# Patient Record
Sex: Female | Born: 1945 | Race: White | Hispanic: No | Marital: Married | Smoking: Never smoker
Health system: Southern US, Community
[De-identification: ages and names within clinical notes are randomized; demographics above are authoritative.]

## PROBLEM LIST (undated history)

## (undated) DIAGNOSIS — Z95810 Presence of automatic (implantable) cardiac defibrillator: Secondary | ICD-10-CM

## (undated) DIAGNOSIS — I429 Cardiomyopathy, unspecified: Secondary | ICD-10-CM

## (undated) DIAGNOSIS — I6529 Occlusion and stenosis of unspecified carotid artery: Secondary | ICD-10-CM

## (undated) DIAGNOSIS — E1122 Type 2 diabetes mellitus with diabetic chronic kidney disease: Secondary | ICD-10-CM

## (undated) DIAGNOSIS — N183 Chronic kidney disease, stage 3 (moderate): Secondary | ICD-10-CM

## (undated) DIAGNOSIS — E039 Hypothyroidism, unspecified: Secondary | ICD-10-CM

## (undated) DIAGNOSIS — Z79899 Other long term (current) drug therapy: Secondary | ICD-10-CM

## (undated) DIAGNOSIS — I472 Ventricular tachycardia: Secondary | ICD-10-CM

## (undated) DIAGNOSIS — I501 Left ventricular failure: Secondary | ICD-10-CM

## (undated) DIAGNOSIS — I2510 Atherosclerotic heart disease of native coronary artery without angina pectoris: Secondary | ICD-10-CM

## (undated) DIAGNOSIS — Z7982 Long term (current) use of aspirin: Secondary | ICD-10-CM

## (undated) DIAGNOSIS — I10 Essential (primary) hypertension: Secondary | ICD-10-CM

## (undated) DIAGNOSIS — E7800 Pure hypercholesterolemia, unspecified: Secondary | ICD-10-CM

## (undated) DIAGNOSIS — I25118 Atherosclerotic heart disease of native coronary artery with other forms of angina pectoris: Secondary | ICD-10-CM

## (undated) DIAGNOSIS — I209 Angina pectoris, unspecified: Secondary | ICD-10-CM

## (undated) DIAGNOSIS — R55 Syncope and collapse: Secondary | ICD-10-CM

## (undated) DIAGNOSIS — Z794 Long term (current) use of insulin: Secondary | ICD-10-CM

## (undated) DIAGNOSIS — I219 Acute myocardial infarction, unspecified: Secondary | ICD-10-CM

## (undated) DIAGNOSIS — I6523 Occlusion and stenosis of bilateral carotid arteries: Secondary | ICD-10-CM

## (undated) HISTORY — DX: Acute myocardial infarction, unspecified: I21.9

## (undated) HISTORY — PX: CORONARY STENT PLACEMENT: SHX1402

## (undated) HISTORY — DX: Presence of automatic (implantable) cardiac defibrillator: Z95.810

## (undated) HISTORY — PX: CARDIAC CATHETERIZATION: SHX172

## (undated) HISTORY — DX: Ventricular tachycardia: I47.2

## (undated) HISTORY — DX: Cardiomyopathy, unspecified: I42.9

## (undated) HISTORY — DX: Left ventricular failure, unspecified: I50.1

## (undated) HISTORY — DX: Occlusion and stenosis of unspecified carotid artery: I65.29

## (undated) HISTORY — DX: Long term (current) use of insulin: Z79.4

## (undated) HISTORY — DX: Long term (current) use of aspirin: Z79.82

## (undated) HISTORY — PX: ANKLE FRACTURE SURGERY: SHX122

## (undated) HISTORY — DX: Other long term (current) drug therapy: Z79.899

## (undated) HISTORY — PX: PACEMAKER PLACEMENT: SHX43

## (undated) HISTORY — DX: Essential (primary) hypertension: I10

## (undated) HISTORY — DX: Atherosclerotic heart disease of native coronary artery without angina pectoris: I25.10

## (undated) HISTORY — PX: ABDOMINAL HYSTERECTOMY: SHX81

## (undated) HISTORY — DX: Angina pectoris, unspecified: I20.9

## (undated) HISTORY — DX: Type 2 diabetes mellitus with diabetic chronic kidney disease: E11.22

## (undated) HISTORY — DX: Pure hypercholesterolemia, unspecified: E78.00

## (undated) HISTORY — DX: Hypothyroidism, unspecified: E03.9

## (undated) HISTORY — DX: Syncope and collapse: R55

## (undated) HISTORY — PX: CATARACT EXTRACTION: SUR2

## (undated) HISTORY — DX: Atherosclerotic heart disease of native coronary artery with other forms of angina pectoris: I25.118

## (undated) HISTORY — DX: Occlusion and stenosis of bilateral carotid arteries: I65.23

## (undated) HISTORY — DX: Chronic kidney disease, stage 3 (moderate): N18.3

---

## 2011-09-17 HISTORY — PX: COLONOSCOPY: SHX174

## 2014-10-11 DIAGNOSIS — I498 Other specified cardiac arrhythmias: Secondary | ICD-10-CM | POA: Diagnosis not present

## 2014-11-30 DIAGNOSIS — Z23 Encounter for immunization: Secondary | ICD-10-CM | POA: Diagnosis not present

## 2014-11-30 DIAGNOSIS — E119 Type 2 diabetes mellitus without complications: Secondary | ICD-10-CM | POA: Diagnosis not present

## 2015-01-12 DIAGNOSIS — I498 Other specified cardiac arrhythmias: Secondary | ICD-10-CM | POA: Diagnosis not present

## 2015-01-12 DIAGNOSIS — Z4502 Encounter for adjustment and management of automatic implantable cardiac defibrillator: Secondary | ICD-10-CM | POA: Diagnosis not present

## 2015-02-09 DIAGNOSIS — L820 Inflamed seborrheic keratosis: Secondary | ICD-10-CM | POA: Diagnosis not present

## 2015-03-06 DIAGNOSIS — I429 Cardiomyopathy, unspecified: Secondary | ICD-10-CM | POA: Diagnosis not present

## 2015-03-06 DIAGNOSIS — Z95810 Presence of automatic (implantable) cardiac defibrillator: Secondary | ICD-10-CM | POA: Diagnosis not present

## 2015-03-06 DIAGNOSIS — R198 Other specified symptoms and signs involving the digestive system and abdomen: Secondary | ICD-10-CM | POA: Diagnosis not present

## 2015-03-09 DIAGNOSIS — E1165 Type 2 diabetes mellitus with hyperglycemia: Secondary | ICD-10-CM | POA: Diagnosis not present

## 2015-03-09 DIAGNOSIS — N183 Chronic kidney disease, stage 3 (moderate): Secondary | ICD-10-CM | POA: Diagnosis not present

## 2015-03-09 DIAGNOSIS — R109 Unspecified abdominal pain: Secondary | ICD-10-CM | POA: Diagnosis not present

## 2015-04-18 DIAGNOSIS — Z4502 Encounter for adjustment and management of automatic implantable cardiac defibrillator: Secondary | ICD-10-CM | POA: Diagnosis not present

## 2015-04-18 DIAGNOSIS — I498 Other specified cardiac arrhythmias: Secondary | ICD-10-CM | POA: Diagnosis not present

## 2015-05-01 DIAGNOSIS — I501 Left ventricular failure: Secondary | ICD-10-CM

## 2015-05-01 DIAGNOSIS — E7800 Pure hypercholesterolemia, unspecified: Secondary | ICD-10-CM

## 2015-05-01 DIAGNOSIS — N184 Chronic kidney disease, stage 4 (severe): Secondary | ICD-10-CM

## 2015-05-01 DIAGNOSIS — I472 Ventricular tachycardia, unspecified: Secondary | ICD-10-CM | POA: Insufficient documentation

## 2015-05-01 DIAGNOSIS — I2510 Atherosclerotic heart disease of native coronary artery without angina pectoris: Secondary | ICD-10-CM

## 2015-05-01 DIAGNOSIS — I25118 Atherosclerotic heart disease of native coronary artery with other forms of angina pectoris: Secondary | ICD-10-CM

## 2015-05-01 DIAGNOSIS — Z95810 Presence of automatic (implantable) cardiac defibrillator: Secondary | ICD-10-CM

## 2015-05-01 DIAGNOSIS — I25119 Atherosclerotic heart disease of native coronary artery with unspecified angina pectoris: Secondary | ICD-10-CM

## 2015-05-01 DIAGNOSIS — I5022 Chronic systolic (congestive) heart failure: Secondary | ICD-10-CM

## 2015-05-01 DIAGNOSIS — I130 Hypertensive heart and chronic kidney disease with heart failure and stage 1 through stage 4 chronic kidney disease, or unspecified chronic kidney disease: Secondary | ICD-10-CM

## 2015-05-01 DIAGNOSIS — E039 Hypothyroidism, unspecified: Secondary | ICD-10-CM

## 2015-05-01 DIAGNOSIS — I219 Acute myocardial infarction, unspecified: Secondary | ICD-10-CM

## 2015-05-01 DIAGNOSIS — R55 Syncope and collapse: Secondary | ICD-10-CM

## 2015-05-01 DIAGNOSIS — I10 Essential (primary) hypertension: Secondary | ICD-10-CM | POA: Insufficient documentation

## 2015-05-01 DIAGNOSIS — I429 Cardiomyopathy, unspecified: Secondary | ICD-10-CM

## 2015-05-01 HISTORY — DX: Hypothyroidism, unspecified: E03.9

## 2015-05-01 HISTORY — DX: Presence of automatic (implantable) cardiac defibrillator: Z95.810

## 2015-05-01 HISTORY — DX: Hypertensive heart and chronic kidney disease with heart failure and stage 1 through stage 4 chronic kidney disease, or unspecified chronic kidney disease: I13.0

## 2015-05-01 HISTORY — DX: Acute myocardial infarction, unspecified: I21.9

## 2015-05-01 HISTORY — DX: Ventricular tachycardia, unspecified: I47.20

## 2015-05-01 HISTORY — DX: Atherosclerotic heart disease of native coronary artery without angina pectoris: I25.10

## 2015-05-01 HISTORY — DX: Essential (primary) hypertension: I10

## 2015-05-01 HISTORY — DX: Atherosclerotic heart disease of native coronary artery with other forms of angina pectoris: I25.118

## 2015-05-01 HISTORY — DX: Pure hypercholesterolemia, unspecified: E78.00

## 2015-05-01 HISTORY — DX: Left ventricular failure, unspecified: I50.1

## 2015-05-01 HISTORY — DX: Atherosclerotic heart disease of native coronary artery with unspecified angina pectoris: I25.119

## 2015-05-01 HISTORY — DX: Cardiomyopathy, unspecified: I42.9

## 2015-05-01 HISTORY — DX: Ventricular tachycardia: I47.2

## 2015-05-01 HISTORY — DX: Syncope and collapse: R55

## 2015-05-03 DIAGNOSIS — S0990XA Unspecified injury of head, initial encounter: Secondary | ICD-10-CM | POA: Diagnosis not present

## 2015-05-03 DIAGNOSIS — E1165 Type 2 diabetes mellitus with hyperglycemia: Secondary | ICD-10-CM | POA: Diagnosis not present

## 2015-05-03 DIAGNOSIS — R51 Headache: Secondary | ICD-10-CM | POA: Diagnosis not present

## 2015-05-03 DIAGNOSIS — G4700 Insomnia, unspecified: Secondary | ICD-10-CM | POA: Diagnosis not present

## 2015-05-03 DIAGNOSIS — R55 Syncope and collapse: Secondary | ICD-10-CM | POA: Diagnosis not present

## 2015-05-09 DIAGNOSIS — I6523 Occlusion and stenosis of bilateral carotid arteries: Secondary | ICD-10-CM | POA: Diagnosis not present

## 2015-05-09 DIAGNOSIS — R55 Syncope and collapse: Secondary | ICD-10-CM | POA: Diagnosis not present

## 2015-05-12 DIAGNOSIS — Z95810 Presence of automatic (implantable) cardiac defibrillator: Secondary | ICD-10-CM | POA: Diagnosis not present

## 2015-05-12 DIAGNOSIS — I429 Cardiomyopathy, unspecified: Secondary | ICD-10-CM | POA: Diagnosis not present

## 2015-05-12 DIAGNOSIS — I779 Disorder of arteries and arterioles, unspecified: Secondary | ICD-10-CM | POA: Diagnosis not present

## 2015-05-18 DIAGNOSIS — Z95810 Presence of automatic (implantable) cardiac defibrillator: Secondary | ICD-10-CM | POA: Diagnosis not present

## 2015-05-18 DIAGNOSIS — I6523 Occlusion and stenosis of bilateral carotid arteries: Secondary | ICD-10-CM | POA: Diagnosis not present

## 2015-05-18 DIAGNOSIS — I779 Disorder of arteries and arterioles, unspecified: Secondary | ICD-10-CM | POA: Diagnosis not present

## 2015-06-15 DIAGNOSIS — I6523 Occlusion and stenosis of bilateral carotid arteries: Secondary | ICD-10-CM | POA: Diagnosis not present

## 2015-06-15 DIAGNOSIS — Z95810 Presence of automatic (implantable) cardiac defibrillator: Secondary | ICD-10-CM | POA: Diagnosis not present

## 2015-06-15 DIAGNOSIS — I10 Essential (primary) hypertension: Secondary | ICD-10-CM | POA: Diagnosis not present

## 2015-06-15 DIAGNOSIS — E119 Type 2 diabetes mellitus without complications: Secondary | ICD-10-CM | POA: Diagnosis not present

## 2015-06-15 DIAGNOSIS — E785 Hyperlipidemia, unspecified: Secondary | ICD-10-CM | POA: Diagnosis not present

## 2015-06-21 DIAGNOSIS — I6521 Occlusion and stenosis of right carotid artery: Secondary | ICD-10-CM | POA: Diagnosis not present

## 2015-06-21 DIAGNOSIS — I6529 Occlusion and stenosis of unspecified carotid artery: Secondary | ICD-10-CM | POA: Insufficient documentation

## 2015-06-21 DIAGNOSIS — E119 Type 2 diabetes mellitus without complications: Secondary | ICD-10-CM | POA: Diagnosis not present

## 2015-06-21 DIAGNOSIS — I10 Essential (primary) hypertension: Secondary | ICD-10-CM | POA: Diagnosis not present

## 2015-06-21 DIAGNOSIS — I252 Old myocardial infarction: Secondary | ICD-10-CM | POA: Diagnosis not present

## 2015-06-21 DIAGNOSIS — I509 Heart failure, unspecified: Secondary | ICD-10-CM | POA: Diagnosis not present

## 2015-06-21 DIAGNOSIS — Z794 Long term (current) use of insulin: Secondary | ICD-10-CM | POA: Diagnosis not present

## 2015-06-21 HISTORY — PX: CAROTID STENT: SHX1301

## 2015-06-21 HISTORY — DX: Occlusion and stenosis of unspecified carotid artery: I65.29

## 2015-07-04 DIAGNOSIS — Z23 Encounter for immunization: Secondary | ICD-10-CM | POA: Diagnosis not present

## 2015-07-07 DIAGNOSIS — E039 Hypothyroidism, unspecified: Secondary | ICD-10-CM | POA: Diagnosis not present

## 2015-07-07 DIAGNOSIS — N179 Acute kidney failure, unspecified: Secondary | ICD-10-CM | POA: Diagnosis not present

## 2015-07-07 DIAGNOSIS — I25110 Atherosclerotic heart disease of native coronary artery with unstable angina pectoris: Secondary | ICD-10-CM | POA: Diagnosis not present

## 2015-07-07 DIAGNOSIS — I5042 Chronic combined systolic (congestive) and diastolic (congestive) heart failure: Secondary | ICD-10-CM | POA: Diagnosis not present

## 2015-07-07 DIAGNOSIS — Z79899 Other long term (current) drug therapy: Secondary | ICD-10-CM | POA: Diagnosis not present

## 2015-07-07 DIAGNOSIS — I252 Old myocardial infarction: Secondary | ICD-10-CM | POA: Diagnosis not present

## 2015-07-07 DIAGNOSIS — F329 Major depressive disorder, single episode, unspecified: Secondary | ICD-10-CM | POA: Diagnosis not present

## 2015-07-07 DIAGNOSIS — I509 Heart failure, unspecified: Secondary | ICD-10-CM | POA: Diagnosis not present

## 2015-07-07 DIAGNOSIS — I10 Essential (primary) hypertension: Secondary | ICD-10-CM | POA: Diagnosis not present

## 2015-07-07 DIAGNOSIS — E785 Hyperlipidemia, unspecified: Secondary | ICD-10-CM | POA: Diagnosis not present

## 2015-07-07 DIAGNOSIS — I209 Angina pectoris, unspecified: Secondary | ICD-10-CM | POA: Diagnosis not present

## 2015-07-07 DIAGNOSIS — I249 Acute ischemic heart disease, unspecified: Secondary | ICD-10-CM | POA: Diagnosis not present

## 2015-07-07 DIAGNOSIS — E1165 Type 2 diabetes mellitus with hyperglycemia: Secondary | ICD-10-CM | POA: Diagnosis not present

## 2015-07-07 DIAGNOSIS — R079 Chest pain, unspecified: Secondary | ICD-10-CM | POA: Diagnosis not present

## 2015-07-07 DIAGNOSIS — Z882 Allergy status to sulfonamides status: Secondary | ICD-10-CM | POA: Diagnosis not present

## 2015-07-07 DIAGNOSIS — Z794 Long term (current) use of insulin: Secondary | ICD-10-CM | POA: Diagnosis not present

## 2015-07-07 DIAGNOSIS — Z95810 Presence of automatic (implantable) cardiac defibrillator: Secondary | ICD-10-CM | POA: Diagnosis not present

## 2015-07-07 DIAGNOSIS — M1990 Unspecified osteoarthritis, unspecified site: Secondary | ICD-10-CM | POA: Diagnosis not present

## 2015-07-07 DIAGNOSIS — I200 Unstable angina: Secondary | ICD-10-CM | POA: Diagnosis not present

## 2015-07-07 DIAGNOSIS — Z9861 Coronary angioplasty status: Secondary | ICD-10-CM | POA: Diagnosis not present

## 2015-07-07 DIAGNOSIS — R0600 Dyspnea, unspecified: Secondary | ICD-10-CM | POA: Diagnosis not present

## 2015-07-07 DIAGNOSIS — R0602 Shortness of breath: Secondary | ICD-10-CM | POA: Diagnosis not present

## 2015-07-07 DIAGNOSIS — I255 Ischemic cardiomyopathy: Secondary | ICD-10-CM | POA: Diagnosis not present

## 2015-07-07 DIAGNOSIS — I6521 Occlusion and stenosis of right carotid artery: Secondary | ICD-10-CM | POA: Diagnosis not present

## 2015-07-09 DIAGNOSIS — Z95810 Presence of automatic (implantable) cardiac defibrillator: Secondary | ICD-10-CM | POA: Diagnosis not present

## 2015-07-09 DIAGNOSIS — I501 Left ventricular failure: Secondary | ICD-10-CM | POA: Diagnosis not present

## 2015-07-09 DIAGNOSIS — I2510 Atherosclerotic heart disease of native coronary artery without angina pectoris: Secondary | ICD-10-CM | POA: Diagnosis not present

## 2015-07-09 DIAGNOSIS — I25118 Atherosclerotic heart disease of native coronary artery with other forms of angina pectoris: Secondary | ICD-10-CM | POA: Diagnosis not present

## 2015-07-09 DIAGNOSIS — I255 Ischemic cardiomyopathy: Secondary | ICD-10-CM | POA: Diagnosis not present

## 2015-07-09 DIAGNOSIS — N179 Acute kidney failure, unspecified: Secondary | ICD-10-CM | POA: Diagnosis not present

## 2015-07-09 DIAGNOSIS — R0602 Shortness of breath: Secondary | ICD-10-CM | POA: Diagnosis not present

## 2015-07-09 DIAGNOSIS — I10 Essential (primary) hypertension: Secondary | ICD-10-CM | POA: Diagnosis not present

## 2015-07-09 DIAGNOSIS — I498 Other specified cardiac arrhythmias: Secondary | ICD-10-CM | POA: Diagnosis not present

## 2015-07-09 DIAGNOSIS — I2109 ST elevation (STEMI) myocardial infarction involving other coronary artery of anterior wall: Secondary | ICD-10-CM | POA: Diagnosis not present

## 2015-07-09 DIAGNOSIS — Z955 Presence of coronary angioplasty implant and graft: Secondary | ICD-10-CM | POA: Diagnosis not present

## 2015-07-09 DIAGNOSIS — Z7982 Long term (current) use of aspirin: Secondary | ICD-10-CM | POA: Diagnosis not present

## 2015-07-09 DIAGNOSIS — I252 Old myocardial infarction: Secondary | ICD-10-CM | POA: Diagnosis not present

## 2015-07-09 DIAGNOSIS — I739 Peripheral vascular disease, unspecified: Secondary | ICD-10-CM | POA: Diagnosis not present

## 2015-07-09 DIAGNOSIS — E1122 Type 2 diabetes mellitus with diabetic chronic kidney disease: Secondary | ICD-10-CM | POA: Diagnosis not present

## 2015-07-09 DIAGNOSIS — R079 Chest pain, unspecified: Secondary | ICD-10-CM | POA: Diagnosis not present

## 2015-07-09 DIAGNOSIS — E785 Hyperlipidemia, unspecified: Secondary | ICD-10-CM | POA: Diagnosis not present

## 2015-07-09 DIAGNOSIS — I509 Heart failure, unspecified: Secondary | ICD-10-CM | POA: Diagnosis not present

## 2015-07-09 DIAGNOSIS — I200 Unstable angina: Secondary | ICD-10-CM | POA: Diagnosis not present

## 2015-07-09 DIAGNOSIS — E1165 Type 2 diabetes mellitus with hyperglycemia: Secondary | ICD-10-CM | POA: Diagnosis not present

## 2015-07-09 DIAGNOSIS — N189 Chronic kidney disease, unspecified: Secondary | ICD-10-CM | POA: Diagnosis not present

## 2015-07-09 DIAGNOSIS — I249 Acute ischemic heart disease, unspecified: Secondary | ICD-10-CM | POA: Diagnosis not present

## 2015-07-09 DIAGNOSIS — I5042 Chronic combined systolic (congestive) and diastolic (congestive) heart failure: Secondary | ICD-10-CM | POA: Diagnosis not present

## 2015-07-09 DIAGNOSIS — Z4502 Encounter for adjustment and management of automatic implantable cardiac defibrillator: Secondary | ICD-10-CM | POA: Diagnosis not present

## 2015-07-09 DIAGNOSIS — I130 Hypertensive heart and chronic kidney disease with heart failure and stage 1 through stage 4 chronic kidney disease, or unspecified chronic kidney disease: Secondary | ICD-10-CM | POA: Diagnosis not present

## 2015-07-09 DIAGNOSIS — I2582 Chronic total occlusion of coronary artery: Secondary | ICD-10-CM | POA: Diagnosis not present

## 2015-07-13 DIAGNOSIS — I209 Angina pectoris, unspecified: Secondary | ICD-10-CM

## 2015-07-13 DIAGNOSIS — I498 Other specified cardiac arrhythmias: Secondary | ICD-10-CM | POA: Diagnosis not present

## 2015-07-13 DIAGNOSIS — Z4502 Encounter for adjustment and management of automatic implantable cardiac defibrillator: Secondary | ICD-10-CM | POA: Diagnosis not present

## 2015-07-13 HISTORY — DX: Angina pectoris, unspecified: I20.9

## 2015-07-27 DIAGNOSIS — Z95820 Peripheral vascular angioplasty status with implants and grafts: Secondary | ICD-10-CM | POA: Diagnosis not present

## 2015-07-27 DIAGNOSIS — I6523 Occlusion and stenosis of bilateral carotid arteries: Secondary | ICD-10-CM | POA: Diagnosis not present

## 2015-08-09 DIAGNOSIS — H9202 Otalgia, left ear: Secondary | ICD-10-CM | POA: Diagnosis not present

## 2015-08-22 DIAGNOSIS — I10 Essential (primary) hypertension: Secondary | ICD-10-CM | POA: Diagnosis not present

## 2015-08-22 DIAGNOSIS — I5022 Chronic systolic (congestive) heart failure: Secondary | ICD-10-CM | POA: Diagnosis not present

## 2015-08-22 DIAGNOSIS — I6521 Occlusion and stenosis of right carotid artery: Secondary | ICD-10-CM | POA: Diagnosis not present

## 2015-08-22 DIAGNOSIS — I429 Cardiomyopathy, unspecified: Secondary | ICD-10-CM | POA: Diagnosis not present

## 2015-08-22 DIAGNOSIS — I25118 Atherosclerotic heart disease of native coronary artery with other forms of angina pectoris: Secondary | ICD-10-CM | POA: Diagnosis not present

## 2015-10-13 DIAGNOSIS — I2510 Atherosclerotic heart disease of native coronary artery without angina pectoris: Secondary | ICD-10-CM | POA: Diagnosis not present

## 2015-10-13 DIAGNOSIS — I498 Other specified cardiac arrhythmias: Secondary | ICD-10-CM | POA: Diagnosis not present

## 2015-10-13 DIAGNOSIS — Z95810 Presence of automatic (implantable) cardiac defibrillator: Secondary | ICD-10-CM | POA: Diagnosis not present

## 2015-10-13 DIAGNOSIS — Z4502 Encounter for adjustment and management of automatic implantable cardiac defibrillator: Secondary | ICD-10-CM | POA: Diagnosis not present

## 2015-10-13 DIAGNOSIS — I429 Cardiomyopathy, unspecified: Secondary | ICD-10-CM | POA: Diagnosis not present

## 2015-10-13 DIAGNOSIS — I25118 Atherosclerotic heart disease of native coronary artery with other forms of angina pectoris: Secondary | ICD-10-CM | POA: Diagnosis not present

## 2015-10-13 DIAGNOSIS — I501 Left ventricular failure: Secondary | ICD-10-CM | POA: Diagnosis not present

## 2015-10-24 DIAGNOSIS — Z1389 Encounter for screening for other disorder: Secondary | ICD-10-CM | POA: Diagnosis not present

## 2015-10-24 DIAGNOSIS — E039 Hypothyroidism, unspecified: Secondary | ICD-10-CM | POA: Diagnosis not present

## 2015-10-24 DIAGNOSIS — D510 Vitamin B12 deficiency anemia due to intrinsic factor deficiency: Secondary | ICD-10-CM | POA: Diagnosis not present

## 2015-10-24 DIAGNOSIS — E559 Vitamin D deficiency, unspecified: Secondary | ICD-10-CM | POA: Diagnosis not present

## 2015-10-24 DIAGNOSIS — Z0000 Encounter for general adult medical examination without abnormal findings: Secondary | ICD-10-CM | POA: Diagnosis not present

## 2015-10-24 DIAGNOSIS — E1165 Type 2 diabetes mellitus with hyperglycemia: Secondary | ICD-10-CM | POA: Diagnosis not present

## 2015-10-24 DIAGNOSIS — Z79899 Other long term (current) drug therapy: Secondary | ICD-10-CM | POA: Diagnosis not present

## 2015-10-25 DIAGNOSIS — R5383 Other fatigue: Secondary | ICD-10-CM | POA: Diagnosis not present

## 2015-11-01 DIAGNOSIS — I509 Heart failure, unspecified: Secondary | ICD-10-CM | POA: Diagnosis not present

## 2015-11-02 DIAGNOSIS — I25118 Atherosclerotic heart disease of native coronary artery with other forms of angina pectoris: Secondary | ICD-10-CM | POA: Diagnosis not present

## 2015-11-02 DIAGNOSIS — Z95810 Presence of automatic (implantable) cardiac defibrillator: Secondary | ICD-10-CM | POA: Diagnosis not present

## 2015-11-02 DIAGNOSIS — I501 Left ventricular failure: Secondary | ICD-10-CM | POA: Diagnosis not present

## 2015-11-02 DIAGNOSIS — I2582 Chronic total occlusion of coronary artery: Secondary | ICD-10-CM | POA: Diagnosis not present

## 2015-11-02 DIAGNOSIS — I429 Cardiomyopathy, unspecified: Secondary | ICD-10-CM | POA: Diagnosis not present

## 2015-11-07 DIAGNOSIS — I209 Angina pectoris, unspecified: Secondary | ICD-10-CM | POA: Diagnosis not present

## 2015-11-07 DIAGNOSIS — I501 Left ventricular failure: Secondary | ICD-10-CM | POA: Diagnosis not present

## 2015-11-07 DIAGNOSIS — R55 Syncope and collapse: Secondary | ICD-10-CM | POA: Diagnosis not present

## 2015-11-07 DIAGNOSIS — I5022 Chronic systolic (congestive) heart failure: Secondary | ICD-10-CM | POA: Diagnosis not present

## 2015-11-07 DIAGNOSIS — I472 Ventricular tachycardia: Secondary | ICD-10-CM | POA: Diagnosis not present

## 2015-12-06 DIAGNOSIS — I10 Essential (primary) hypertension: Secondary | ICD-10-CM | POA: Diagnosis not present

## 2015-12-06 DIAGNOSIS — I472 Ventricular tachycardia: Secondary | ICD-10-CM | POA: Diagnosis not present

## 2015-12-06 DIAGNOSIS — I501 Left ventricular failure: Secondary | ICD-10-CM | POA: Diagnosis not present

## 2015-12-06 DIAGNOSIS — I25118 Atherosclerotic heart disease of native coronary artery with other forms of angina pectoris: Secondary | ICD-10-CM | POA: Diagnosis not present

## 2015-12-06 DIAGNOSIS — I429 Cardiomyopathy, unspecified: Secondary | ICD-10-CM | POA: Diagnosis not present

## 2016-01-25 DIAGNOSIS — Z95810 Presence of automatic (implantable) cardiac defibrillator: Secondary | ICD-10-CM | POA: Diagnosis not present

## 2016-02-21 DIAGNOSIS — I6523 Occlusion and stenosis of bilateral carotid arteries: Secondary | ICD-10-CM | POA: Diagnosis not present

## 2016-02-21 DIAGNOSIS — Z95820 Peripheral vascular angioplasty status with implants and grafts: Secondary | ICD-10-CM | POA: Diagnosis not present

## 2016-03-07 DIAGNOSIS — I2510 Atherosclerotic heart disease of native coronary artery without angina pectoris: Secondary | ICD-10-CM | POA: Diagnosis not present

## 2016-03-07 DIAGNOSIS — I6523 Occlusion and stenosis of bilateral carotid arteries: Secondary | ICD-10-CM | POA: Insufficient documentation

## 2016-03-07 DIAGNOSIS — Z7982 Long term (current) use of aspirin: Secondary | ICD-10-CM

## 2016-03-07 DIAGNOSIS — I10 Essential (primary) hypertension: Secondary | ICD-10-CM | POA: Diagnosis not present

## 2016-03-07 DIAGNOSIS — E7800 Pure hypercholesterolemia, unspecified: Secondary | ICD-10-CM | POA: Diagnosis not present

## 2016-03-07 HISTORY — DX: Long term (current) use of aspirin: Z79.82

## 2016-03-07 HISTORY — DX: Occlusion and stenosis of bilateral carotid arteries: I65.23

## 2016-03-27 DIAGNOSIS — M25572 Pain in left ankle and joints of left foot: Secondary | ICD-10-CM | POA: Diagnosis not present

## 2016-03-27 DIAGNOSIS — T85848A Pain due to other internal prosthetic devices, implants and grafts, initial encounter: Secondary | ICD-10-CM | POA: Diagnosis not present

## 2016-03-27 DIAGNOSIS — M25571 Pain in right ankle and joints of right foot: Secondary | ICD-10-CM | POA: Diagnosis not present

## 2016-04-16 DIAGNOSIS — E1165 Type 2 diabetes mellitus with hyperglycemia: Secondary | ICD-10-CM | POA: Diagnosis not present

## 2016-04-16 DIAGNOSIS — E559 Vitamin D deficiency, unspecified: Secondary | ICD-10-CM | POA: Diagnosis not present

## 2016-04-30 DIAGNOSIS — N183 Chronic kidney disease, stage 3 (moderate): Secondary | ICD-10-CM | POA: Diagnosis not present

## 2016-04-30 DIAGNOSIS — R51 Headache: Secondary | ICD-10-CM | POA: Diagnosis not present

## 2016-04-30 DIAGNOSIS — E119 Type 2 diabetes mellitus without complications: Secondary | ICD-10-CM | POA: Diagnosis not present

## 2016-04-30 DIAGNOSIS — G4700 Insomnia, unspecified: Secondary | ICD-10-CM | POA: Diagnosis not present

## 2016-05-02 DIAGNOSIS — L578 Other skin changes due to chronic exposure to nonionizing radiation: Secondary | ICD-10-CM | POA: Diagnosis not present

## 2016-05-02 DIAGNOSIS — L918 Other hypertrophic disorders of the skin: Secondary | ICD-10-CM | POA: Diagnosis not present

## 2016-05-02 DIAGNOSIS — L820 Inflamed seborrheic keratosis: Secondary | ICD-10-CM | POA: Diagnosis not present

## 2016-05-02 DIAGNOSIS — L821 Other seborrheic keratosis: Secondary | ICD-10-CM | POA: Diagnosis not present

## 2016-05-30 DIAGNOSIS — Z95810 Presence of automatic (implantable) cardiac defibrillator: Secondary | ICD-10-CM | POA: Diagnosis not present

## 2016-06-19 DIAGNOSIS — Z23 Encounter for immunization: Secondary | ICD-10-CM | POA: Diagnosis not present

## 2016-06-27 DIAGNOSIS — R55 Syncope and collapse: Secondary | ICD-10-CM | POA: Diagnosis not present

## 2016-06-27 DIAGNOSIS — Z4502 Encounter for adjustment and management of automatic implantable cardiac defibrillator: Secondary | ICD-10-CM | POA: Diagnosis not present

## 2016-06-27 DIAGNOSIS — Z95810 Presence of automatic (implantable) cardiac defibrillator: Secondary | ICD-10-CM | POA: Diagnosis not present

## 2016-07-17 DIAGNOSIS — I255 Ischemic cardiomyopathy: Secondary | ICD-10-CM | POA: Diagnosis not present

## 2016-07-17 DIAGNOSIS — N183 Chronic kidney disease, stage 3 unspecified: Secondary | ICD-10-CM

## 2016-07-17 DIAGNOSIS — E1122 Type 2 diabetes mellitus with diabetic chronic kidney disease: Secondary | ICD-10-CM

## 2016-07-17 DIAGNOSIS — I25118 Atherosclerotic heart disease of native coronary artery with other forms of angina pectoris: Secondary | ICD-10-CM | POA: Diagnosis not present

## 2016-07-17 DIAGNOSIS — R55 Syncope and collapse: Secondary | ICD-10-CM | POA: Diagnosis not present

## 2016-07-17 DIAGNOSIS — E7800 Pure hypercholesterolemia, unspecified: Secondary | ICD-10-CM | POA: Diagnosis not present

## 2016-07-17 DIAGNOSIS — E039 Hypothyroidism, unspecified: Secondary | ICD-10-CM | POA: Diagnosis not present

## 2016-07-17 DIAGNOSIS — Z794 Long term (current) use of insulin: Secondary | ICD-10-CM

## 2016-07-17 HISTORY — DX: Type 2 diabetes mellitus with diabetic chronic kidney disease: E11.22

## 2016-07-17 HISTORY — DX: Chronic kidney disease, stage 3 unspecified: N18.30

## 2016-07-31 DIAGNOSIS — I255 Ischemic cardiomyopathy: Secondary | ICD-10-CM | POA: Diagnosis not present

## 2016-08-15 DIAGNOSIS — Z79899 Other long term (current) drug therapy: Secondary | ICD-10-CM

## 2016-08-15 HISTORY — DX: Other long term (current) drug therapy: Z79.899

## 2016-08-21 DIAGNOSIS — Z95810 Presence of automatic (implantable) cardiac defibrillator: Secondary | ICD-10-CM | POA: Diagnosis not present

## 2016-08-21 DIAGNOSIS — I25118 Atherosclerotic heart disease of native coronary artery with other forms of angina pectoris: Secondary | ICD-10-CM | POA: Diagnosis not present

## 2016-08-21 DIAGNOSIS — I5022 Chronic systolic (congestive) heart failure: Secondary | ICD-10-CM | POA: Diagnosis not present

## 2016-08-21 DIAGNOSIS — Z79899 Other long term (current) drug therapy: Secondary | ICD-10-CM | POA: Diagnosis not present

## 2016-08-21 DIAGNOSIS — I472 Ventricular tachycardia: Secondary | ICD-10-CM | POA: Diagnosis not present

## 2016-08-21 DIAGNOSIS — I429 Cardiomyopathy, unspecified: Secondary | ICD-10-CM | POA: Diagnosis not present

## 2016-08-21 DIAGNOSIS — I10 Essential (primary) hypertension: Secondary | ICD-10-CM | POA: Diagnosis not present

## 2016-08-29 DIAGNOSIS — Z95810 Presence of automatic (implantable) cardiac defibrillator: Secondary | ICD-10-CM | POA: Diagnosis not present

## 2016-09-23 DIAGNOSIS — I5022 Chronic systolic (congestive) heart failure: Secondary | ICD-10-CM | POA: Diagnosis not present

## 2016-10-08 DIAGNOSIS — Z95820 Peripheral vascular angioplasty status with implants and grafts: Secondary | ICD-10-CM | POA: Diagnosis not present

## 2016-10-08 DIAGNOSIS — I6523 Occlusion and stenosis of bilateral carotid arteries: Secondary | ICD-10-CM | POA: Diagnosis not present

## 2016-10-23 DIAGNOSIS — I6523 Occlusion and stenosis of bilateral carotid arteries: Secondary | ICD-10-CM | POA: Diagnosis not present

## 2016-11-05 DIAGNOSIS — M8580 Other specified disorders of bone density and structure, unspecified site: Secondary | ICD-10-CM | POA: Diagnosis not present

## 2016-11-05 DIAGNOSIS — Z13820 Encounter for screening for osteoporosis: Secondary | ICD-10-CM | POA: Diagnosis not present

## 2016-11-05 DIAGNOSIS — Z23 Encounter for immunization: Secondary | ICD-10-CM | POA: Diagnosis not present

## 2016-11-05 DIAGNOSIS — N183 Chronic kidney disease, stage 3 (moderate): Secondary | ICD-10-CM | POA: Diagnosis not present

## 2016-11-05 DIAGNOSIS — G4700 Insomnia, unspecified: Secondary | ICD-10-CM | POA: Diagnosis not present

## 2016-11-05 DIAGNOSIS — E559 Vitamin D deficiency, unspecified: Secondary | ICD-10-CM | POA: Diagnosis not present

## 2016-11-05 DIAGNOSIS — Z0000 Encounter for general adult medical examination without abnormal findings: Secondary | ICD-10-CM | POA: Diagnosis not present

## 2016-11-05 DIAGNOSIS — E039 Hypothyroidism, unspecified: Secondary | ICD-10-CM | POA: Diagnosis not present

## 2016-11-05 DIAGNOSIS — E119 Type 2 diabetes mellitus without complications: Secondary | ICD-10-CM | POA: Diagnosis not present

## 2016-11-28 DIAGNOSIS — Z95810 Presence of automatic (implantable) cardiac defibrillator: Secondary | ICD-10-CM | POA: Diagnosis not present

## 2016-11-28 DIAGNOSIS — Z4502 Encounter for adjustment and management of automatic implantable cardiac defibrillator: Secondary | ICD-10-CM | POA: Diagnosis not present

## 2016-12-18 DIAGNOSIS — E1122 Type 2 diabetes mellitus with diabetic chronic kidney disease: Secondary | ICD-10-CM | POA: Diagnosis not present

## 2016-12-18 DIAGNOSIS — Z95810 Presence of automatic (implantable) cardiac defibrillator: Secondary | ICD-10-CM | POA: Diagnosis not present

## 2016-12-18 DIAGNOSIS — N184 Chronic kidney disease, stage 4 (severe): Secondary | ICD-10-CM | POA: Insufficient documentation

## 2016-12-18 DIAGNOSIS — I472 Ventricular tachycardia: Secondary | ICD-10-CM | POA: Diagnosis not present

## 2016-12-18 DIAGNOSIS — Z79899 Other long term (current) drug therapy: Secondary | ICD-10-CM | POA: Diagnosis not present

## 2016-12-18 DIAGNOSIS — N183 Chronic kidney disease, stage 3 unspecified: Secondary | ICD-10-CM

## 2016-12-18 DIAGNOSIS — I255 Ischemic cardiomyopathy: Secondary | ICD-10-CM | POA: Diagnosis not present

## 2016-12-18 DIAGNOSIS — I25118 Atherosclerotic heart disease of native coronary artery with other forms of angina pectoris: Secondary | ICD-10-CM | POA: Diagnosis not present

## 2016-12-18 HISTORY — DX: Chronic kidney disease, stage 3 unspecified: N18.30

## 2017-01-22 DIAGNOSIS — I5022 Chronic systolic (congestive) heart failure: Secondary | ICD-10-CM | POA: Diagnosis not present

## 2017-01-22 DIAGNOSIS — N183 Chronic kidney disease, stage 3 (moderate): Secondary | ICD-10-CM | POA: Diagnosis not present

## 2017-01-22 DIAGNOSIS — I255 Ischemic cardiomyopathy: Secondary | ICD-10-CM | POA: Diagnosis not present

## 2017-01-22 DIAGNOSIS — I25118 Atherosclerotic heart disease of native coronary artery with other forms of angina pectoris: Secondary | ICD-10-CM | POA: Diagnosis not present

## 2017-01-22 DIAGNOSIS — Z79899 Other long term (current) drug therapy: Secondary | ICD-10-CM | POA: Diagnosis not present

## 2017-01-22 DIAGNOSIS — I472 Ventricular tachycardia: Secondary | ICD-10-CM | POA: Diagnosis not present

## 2017-02-14 DIAGNOSIS — K219 Gastro-esophageal reflux disease without esophagitis: Secondary | ICD-10-CM | POA: Diagnosis not present

## 2017-02-28 DIAGNOSIS — Z95810 Presence of automatic (implantable) cardiac defibrillator: Secondary | ICD-10-CM | POA: Diagnosis not present

## 2017-04-08 ENCOUNTER — Other Ambulatory Visit: Payer: Self-pay

## 2017-04-08 ENCOUNTER — Telehealth: Payer: Self-pay | Admitting: Cardiology

## 2017-04-08 MED ORDER — CANDESARTAN CILEXETIL 8 MG PO TABS: 8.0000 mg | ORAL_TABLET | Freq: Every day | ORAL | 0 refills | Status: DC

## 2017-04-08 MED ORDER — CANDESARTAN CILEXETIL 8 MG PO TABS: 8.0000 mg | ORAL_TABLET | Freq: Every day | ORAL | 3 refills | Status: DC

## 2017-04-08 NOTE — Telephone Encounter (Signed)
Called patient to confirm dose of valsartan 80 mg daily and that patient does have heart failure. Patient confirmed. Advised patient that candesartan 8 mg daily will be sent in to [facility] per request. A 15 day supply was sent to [facility] in [place] per patient request to have until mail order supply arrives. Patient verbalized understanding of dosage change. Patient will monitor BP readings over the next few weeks to confirm appropriate dosage.

## 2017-04-08 NOTE — Telephone Encounter (Signed)
Is on valsartin and wants to know what to change it to

## 2017-05-28 DIAGNOSIS — I5022 Chronic systolic (congestive) heart failure: Secondary | ICD-10-CM | POA: Insufficient documentation

## 2017-05-28 DIAGNOSIS — I130 Hypertensive heart and chronic kidney disease with heart failure and stage 1 through stage 4 chronic kidney disease, or unspecified chronic kidney disease: Secondary | ICD-10-CM | POA: Insufficient documentation

## 2017-05-28 DIAGNOSIS — I5023 Acute on chronic systolic (congestive) heart failure: Secondary | ICD-10-CM | POA: Insufficient documentation

## 2017-05-28 HISTORY — DX: Chronic systolic (congestive) heart failure: I50.22

## 2017-05-28 HISTORY — DX: Hypertensive heart and chronic kidney disease with heart failure and stage 1 through stage 4 chronic kidney disease, or unspecified chronic kidney disease: I13.0

## 2017-05-28 NOTE — Progress Notes (Signed)
Cardiology Office Note:    Date:  05/29/2017   ID:  Jenna West, DOB 08/31/1946, MRN [id number]  PCP:  Ronita Hipps, MD  Cardiologist:  Shirlee More, MD    Referring MD: No ref. provider found    ASSESSMENT:    1. Chronic systolic heart failure ([place])   2. 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 VT (ventricular tachycardia) ([place])   4. On amiodarone therapy   5. ICD (implantable cardioverter-defibrillator) in place   6. Pure hypercholesterolemia   7. Coronary artery disease of native artery of native heart with stable angina pectoris ([place])   8. Heartburn       In order of problems listed above:  1. Decompensated volume overloaded she'll take her diuretic daily and follow-up in my office in one week. With severe CK D renal function and BNP will be checked today 2. Stable continue current medical treatment for hypertension, recheck renal function today with stage IV CK D 3. Stable continue amiodarone 4. Continue amiodarone check liver function and thyroid for toxicity 5. Stable, she will transition to EP care in my practice 6. Stable continue statin check lipid profile today 7. I'm quite concerned regarding an atypical presentation of ischemia show a troponin done today and a myocardial perfusion study. If she has high risk markers she'll need to be referred to coronaryangiography 8. Resume her PPI   Next appointment: one week   Medication Adjustments/Labs and Tests Ordered: Current medicines are reviewed at length with the patient today.  Concerns regarding medicines are outlined above.  Orders Placed This Encounter  Procedures  . Troponin I  . B Nat Peptide  . Comprehensive Metabolic Panel (CMET)  . TSH  . Myocardial Perfusion Imaging  . EKG 12-Lead   Meds ordered this encounter  Medications  . pantoprazole (PROTONIX) 40 MG tablet    Sig: Take 1 tablet (40 mg total) by mouth daily.    Dispense:  30 tablet    Refill:  11    Chief  Complaint  Patient presents with  . Congestive Heart Failure  . Shortness of Breath  . Dizziness  . Chest Pain    History of Present Illness:    Jenna West is a 71 y.o. female with a hx of CAD with PCI of LAD and RCA 2004 and LAD 2009 with severe LV dysfunction EF 20-25% and LV aneurysm, CHF, Hypertension, VT on amiodarone and has an ICD, and stage 4 CKD last seen in May 2018.She is unable to afford ARNI.  She is accompanied by a family member nurse For the last month she has not felt well increasingly short of breath and weight is up 3-5 pounds. She rarely takes a loop diuretic. She short of breath ambulating in the home and outdoors but no orthopnea or PND. She no longer weighs herself every day. She also has been having what she calls indigestion which she describes as substernal aching usually related to meals relieved with antacid as well as nitroglycerin on one occasion. Overall she is apprehensive and just doesn't feel well. Compliance with diet, lifestyle and medications: yes  Past Medical History:  Diagnosis Date  . Acquired hypothyroidism 05/01/2015  . Angina pectoris ([place]) 07/13/2015  . Aspirin long-term use 03/07/2016  . Benign essential hypertension 05/01/2015  . Bilateral carotid artery stenosis 03/07/2016  . Cardiomyopathy ([place]) 05/01/2015   Overview:  EF 20% with aneurysm of the apex  . Carotid artery stenosis  06/21/2015  . Coronary arteriosclerosis in native artery 05/01/2015   Overview:  Overview:  PCI and stent to LAD and RCA 2004 for MI DES to LAD for subtotal occlusion with no reflow 8/09  . Coronary artery disease of native artery of native heart with stable angina pectoris ([place]) 05/01/2015   Overview:  PCI and stent to LAD and RCA 2004 for MI DES to LAD for subtotal occlusion with no reflow 2009  . ICD (implantable cardioverter-defibrillator) in place 05/01/2015   Overview:  Medtronic  . Left heart failure ([place]) 05/01/2015  . MI (myocardial infarction) ([place])  05/01/2015  . On amiodarone therapy 08/15/2016  . Pure hypercholesterolemia 05/01/2015  . Stage 3 chronic kidney disease 12/18/2016  . Syncope and collapse 05/01/2015  . Type 2 diabetes mellitus with stage 3 chronic kidney disease, with long-term current use of insulin ([place]) 07/17/2016  . VT (ventricular tachycardia) ([place]) 05/01/2015    Past Surgical History:  Procedure Laterality Date  . ABDOMINAL HYSTERECTOMY    . ANKLE FRACTURE SURGERY Bilateral    6 months apart, screws  . CARDIAC CATHETERIZATION     2003, 2009, 2016  . CAROTID STENT  06/21/2015   thoracic ao, 4 vessel cerebral arteriogram & rt internal carotid artery pta/stent CPT 70623  . CATARACT EXTRACTION Right    With lens replaced  . COLONOSCOPY  2013  . CORONARY STENT PLACEMENT    . PACEMAKER PLACEMENT     has two, one working and one not, could not remove old one, Dr. Elonda Husky     Current Medications: Current Meds  Medication Sig  . amiodarone (PACERONE) 200 MG tablet Take 0.5 mg by mouth as directed. Take on Mon, Wed, and Fri  . candesartan (ATACAND) 8 MG tablet Take 1 tablet (8 mg total) by mouth daily.  . carvedilol (COREG) 12.5 MG tablet Take 6.25 mg by mouth 2 (two) times daily.  . clopidogrel (PLAVIX) 75 MG tablet Take 75 mg by mouth daily.  . furosemide (LASIX) 20 MG tablet Take 20 mg by mouth daily.  Marland Kitchen levothyroxine (SYNTHROID, LEVOTHROID) 50 MCG tablet Take 50 mcg by mouth daily.  . ranolazine (RANEXA) 1000 MG SR tablet Take 1,000 mg by mouth 2 (two) times daily.  . sertraline (ZOLOFT) 25 MG tablet Take 25 mg by mouth daily.  Marland Kitchen spironolactone (ALDACTONE) 25 MG tablet Take 12.5 mg by mouth daily.     Allergies:   Shellfish allergy and Sulfa antibiotics   Social History   Social History  . Marital status: Married    Spouse name: N/A  . Number of children: N/A  . Years of education: N/A   Social History Main Topics  . Smoking status: Never Smoker  . Smokeless tobacco: Never Used  . Alcohol use No  . Drug  use: No  . Sexual activity: Not Asked   Other Topics Concern  . None   Social History Narrative  . None     Family History: The patient's family history includes Brain cancer in her mother; Breast cancer in her sister; Diabetes in her sister; Heart attack in her father; Heart disease in her father; Rectal cancer in her daughter. ROS:   Please see the history of present illness.    All other systems reviewed and are negative.  EKGs/Labs/Other Studies Reviewed:    The following studies were reviewed today: REMOTE MONITORING ASSESSMENT Date of Transmission: February 28, 2017 Patient MR Number: [id number] Patient Name: Jenna West, February 20, 1946, 71 y.o. Following Provider: Shanon Brow  Ardeen Fillers, MD Primary Care Provider: Ronita Hipps, MD Manufacturer of Device: Medtronic Type of Device: Dual Chamber Defibrillator See scanned/downloaded PDF report for model numbers, serial numbers, and date(s) of implant. Presenting Rhythm: AP VS 60 Percentage RV Pacing: <0.1% RV Paced Percentage Biventricular Pacing: Not Applicable Device Findings:  Normal Follow Up  Battery Status Adequate Battery Voltage  Lead Trends Lead Trends Stable  EKG:  EKG ordered today.  The ekg ordered today demonstrates sinus rhythm diffuse T wave abnormality consider ischemia.  Recent Labs: 01/22/17 BNP 2300, Cr 2.05 K 4.9 No results found for requested labs within last 8760 hours.  Recent Lipid Panel 01/22/17 Chol 126, HDL 39 LDL 67 No results found for: CHOL, TRIG, HDL, CHOLHDL, VLDL, LDLCALC, LDLDIRECT  Physical Exam:    VS:  BP 124/68 (BP Location: Right Arm, Patient Position: Sitting)   Pulse 72   Ht 5\' 5"  (1.651 m)   Wt 199 lb 12.8 oz (90.6 kg)   SpO2 99%   BMI 33.25 kg/m     Wt Readings from Last 3 Encounters:  05/29/17 199 lb 12.8 oz (90.6 kg)     GEN:  Well nourished, well developed in no acute distress HEENT: Normal NECK: No JVD; No carotid bruits LYMPHATICS: No  lymphadenopathy CARDIAC: RRR, no murmurs, rubs,S3 RESPIRATORY:  Clear to auscultation without rales, wheezing or rhonchi  ABDOMEN: Soft, non-tender, non-distended MUSCULOSKELETAL:  2+  To rhe knee  edema; No deformity  SKIN: Warm and dry NEUROLOGIC:  Alert and oriented x 3 PSYCHIATRIC:  Normal affect    Signed, Shirlee More, MD  05/29/2017 12:52 PM    [facility]

## 2017-05-29 ENCOUNTER — Telehealth (HOSPITAL_COMMUNITY): Payer: Self-pay | Admitting: *Deleted

## 2017-05-29 ENCOUNTER — Ambulatory Visit (INDEPENDENT_AMBULATORY_CARE_PROVIDER_SITE_OTHER): Payer: Medicare Other | Admitting: Cardiology

## 2017-05-29 ENCOUNTER — Encounter: Payer: Self-pay | Admitting: Cardiology

## 2017-05-29 VITALS — BP 124/68 | HR 72 | Ht 65.0 in | Wt 199.8 lb

## 2017-05-29 DIAGNOSIS — Z95810 Presence of automatic (implantable) cardiac defibrillator: Secondary | ICD-10-CM | POA: Diagnosis not present

## 2017-05-29 DIAGNOSIS — I5022 Chronic systolic (congestive) heart failure: Secondary | ICD-10-CM | POA: Diagnosis not present

## 2017-05-29 DIAGNOSIS — E7800 Pure hypercholesterolemia, unspecified: Secondary | ICD-10-CM | POA: Diagnosis not present

## 2017-05-29 DIAGNOSIS — Z79899 Other long term (current) drug therapy: Secondary | ICD-10-CM

## 2017-05-29 DIAGNOSIS — I472 Ventricular tachycardia, unspecified: Secondary | ICD-10-CM

## 2017-05-29 DIAGNOSIS — I130 Hypertensive heart and chronic kidney disease with heart failure and stage 1 through stage 4 chronic kidney disease, or unspecified chronic kidney disease: Secondary | ICD-10-CM

## 2017-05-29 DIAGNOSIS — Z7689 Persons encountering health services in other specified circumstances: Secondary | ICD-10-CM | POA: Diagnosis not present

## 2017-05-29 DIAGNOSIS — I25118 Atherosclerotic heart disease of native coronary artery with other forms of angina pectoris: Secondary | ICD-10-CM | POA: Diagnosis not present

## 2017-05-29 DIAGNOSIS — R12 Heartburn: Secondary | ICD-10-CM | POA: Diagnosis not present

## 2017-05-29 HISTORY — DX: Heartburn: R12

## 2017-05-29 MED ORDER — PANTOPRAZOLE SODIUM 40 MG PO TBEC: 40.0000 mg | DELAYED_RELEASE_TABLET | Freq: Every day | ORAL | 11 refills | Status: DC

## 2017-05-29 NOTE — Patient Instructions (Signed)
Medication Instructions:  Your physician has recommended you make the following change in your medication:  START furosemide (Lasix) 20 mg daily   Labwork: Your physician recommends that you return for lab work in: today. CMP, CBC, pt/inr, troponin   Testing/Procedures: You had an EKG today.  Your physician has requested that you have a lexiscan myoview. For further information please visit [email or web address]. Please follow instruction sheet, as given.  Follow-Up: Your physician recommends that you schedule a follow-up appointment in: 1 week   Any Other Special Instructions Will Be Listed Below (If Applicable).     If you need a refill on your cardiac medications before your next appointment, please call your pharmacy.

## 2017-05-29 NOTE — Telephone Encounter (Signed)
Left message on voicemail per DPR in reference to upcoming appointment scheduled on 06/02/17 with detailed instructions given per Myocardial Perfusion Study Information Sheet for the test. LM to arrive 15 minutes early, and that it is imperative to arrive on time for appointment to keep from having the test rescheduled. If you need to cancel or reschedule your appointment, please call the office within 24 hours of your appointment. Failure to do so may result in a cancellation of your appointment, and a $50 no show fee. Phone number given for call back for any questions. Kirstie Peri

## 2017-05-30 ENCOUNTER — Other Ambulatory Visit: Payer: Self-pay

## 2017-05-30 LAB — COMPREHENSIVE METABOLIC PANEL
ALBUMIN: 4.5 g/dL (ref 3.5–4.8)
ALT: 19 IU/L (ref 0–32)
AST: 17 IU/L (ref 0–40)
Albumin/Globulin Ratio: 1.6 (ref 1.2–2.2)
Alkaline Phosphatase: 81 IU/L (ref 39–117)
BUN / CREAT RATIO: 18 (ref 12–28)
BUN: 38 mg/dL — ABNORMAL HIGH (ref 8–27)
Bilirubin Total: 0.4 mg/dL (ref 0.0–1.2)
CALCIUM: 8.8 mg/dL (ref 8.7–10.3)
CO2: 21 mmol/L (ref 20–29)
CREATININE: 2.1 mg/dL — AB (ref 0.57–1.00)
Chloride: 105 mmol/L (ref 96–106)
GFR, EST AFRICAN AMERICAN: 27 mL/min/{1.73_m2} — AB (ref >59)
GFR, EST NON AFRICAN AMERICAN: 23 mL/min/{1.73_m2} — AB (ref >59)
GLUCOSE: 313 mg/dL — AB (ref 65–99)
Globulin, Total: 2.8 g/dL (ref 1.5–4.5)
Potassium: 5.3 mmol/L — ABNORMAL HIGH (ref 3.5–5.2)
Sodium: 140 mmol/L (ref 134–144)
TOTAL PROTEIN: 7.3 g/dL (ref 6.0–8.5)

## 2017-05-30 LAB — TROPONIN I: Troponin I: 0.02 ng/mL (ref 0.00–0.04)

## 2017-05-30 LAB — BRAIN NATRIURETIC PEPTIDE: BNP: 251.9 pg/mL — AB (ref 0.0–100.0)

## 2017-05-30 LAB — SPECIMEN STATUS REPORT

## 2017-05-30 LAB — TSH: TSH: 6.77 u[IU]/mL — ABNORMAL HIGH (ref 0.450–4.500)

## 2017-05-30 MED ORDER — FUROSEMIDE 20 MG PO TABS: 20.0000 mg | ORAL_TABLET | Freq: Every day | ORAL | 3 refills | Status: DC

## 2017-06-02 ENCOUNTER — Ambulatory Visit (HOSPITAL_COMMUNITY): Payer: Medicare Other | Attending: Cardiology

## 2017-06-02 ENCOUNTER — Encounter (HOSPITAL_COMMUNITY): Payer: Medicare Other

## 2017-06-02 DIAGNOSIS — I472 Ventricular tachycardia, unspecified: Secondary | ICD-10-CM

## 2017-06-02 DIAGNOSIS — I25118 Atherosclerotic heart disease of native coronary artery with other forms of angina pectoris: Secondary | ICD-10-CM

## 2017-06-02 DIAGNOSIS — R9439 Abnormal result of other cardiovascular function study: Secondary | ICD-10-CM | POA: Insufficient documentation

## 2017-06-02 DIAGNOSIS — I5022 Chronic systolic (congestive) heart failure: Secondary | ICD-10-CM | POA: Insufficient documentation

## 2017-06-02 MED ORDER — TECHNETIUM TC 99M TETROFOSMIN IV KIT
33.0000 | PACK | Freq: Once | INTRAVENOUS | Status: AC | PRN
  Administered 2017-06-02: 33 via INTRAVENOUS
  Filled 2017-06-02: qty 33

## 2017-06-02 MED ORDER — REGADENOSON 0.4 MG/5ML IV SOLN
0.4000 mg | Freq: Once | INTRAVENOUS | Status: AC
  Administered 2017-06-02: 0.4 mg via INTRAVENOUS

## 2017-06-03 ENCOUNTER — Ambulatory Visit (HOSPITAL_COMMUNITY): Payer: Medicare Other | Attending: Cardiology

## 2017-06-03 LAB — MYOCARDIAL PERFUSION IMAGING
CHL CUP NUCLEAR SSS: 34
CHL CUP RESTING HR STRESS: 74 {beats}/min
LV sys vol: 450 mL
LVDIAVOL: 479 mL (ref 46–106)
NUC STRESS TID: 0.96
Peak HR: 90 {beats}/min
RATE: 0.41
SDS: 2
SRS: 32

## 2017-06-03 MED ORDER — TECHNETIUM TC 99M TETROFOSMIN IV KIT
33.0000 | PACK | Freq: Once | INTRAVENOUS | Status: AC | PRN
  Administered 2017-06-03: 33 via INTRAVENOUS
  Filled 2017-06-03: qty 33

## 2017-06-04 DIAGNOSIS — I255 Ischemic cardiomyopathy: Secondary | ICD-10-CM

## 2017-06-04 HISTORY — DX: Ischemic cardiomyopathy: I25.5

## 2017-06-04 NOTE — Progress Notes (Signed)
Cardiology Office Note:    Date:  06/05/2017   ID:  Jenna West, DOB 1946-03-02, MRN [id number]  PCP:  Ronita Hipps, MD  Cardiologist:  Shirlee More, MD    Referring MD: Ronita Hipps, MD    ASSESSMENT:    1. Chronic systolic heart failure ([place])   2. Ischemic cardiomyopathy   3. ICD (implantable cardioverter-defibrillator) in place   4. Coronary artery disease of native artery of native heart with stable angina pectoris ([place])    PLAN:    In order of problems listed above:  1. Worsened, her ejection fraction is now severely reduced and she is symptomatc.She'll continue her current med  Including loop diuretic distal diuretic and beta blocker and transition from ARB to ARNI.I will see back in the office in 2 weeks regarding up titration 2. Worsened little or no evidence of viability and at this time I would not referred for coronary angiography or upgraded to CRT 3. Stable she'll transition to device care within my practice 4. Stable medical treatment and with stage IV CK D and findings of both her viability study and recent myocardial perfusion I would not referred angiography.   Next appointment: yes   Medication Adjustments/Labs and Tests Ordered: Current medicines are reviewed at length with the patient today.  Concerns regarding medicines are outlined above.  No orders of the defined types were placed in this encounter.  Meds ordered this encounter  Medications  . sacubitril-valsartan (ENTRESTO) 24-26 MG    Sig: Take 1 tablet by mouth 2 (two) times daily.    Dispense:  60 tablet    Refill:  3    Chief Complaint  Patient presents with  . Follow-up    flup after nuclear stress test   . Congestive Heart Failure    History of Present Illness:    Jenna West is a 71 y.o. female with a hx of CAD with PCI of LAD and RCA 2004 and LAD 2009 with severe LV dysfunction EF 20-25% and LV aneurysm, CHF, Hypertension, VT on amiodarone and has an ICD, and stage 4 CKD  last seen 1 week ago with decompensated heart failure.Her MPI showed extensive scar and LV dysfunction, EF 6%. Her EF in 2016 was 40% and did not vave CRT, subsequently 25%.  She has had no  Change in symptoms since her last visit. She has had no angina syncope or ICD discharge but is lightheaded at times switching posture. She complains of exercise intolerance and shortness of breath walking outdoors. Her weight is stable she restrict salt has no edema orthopnea or PND.  I reviewed her records her ejection fraction at best was 40% years ago and most recently been in the 20% range. She had her ICD changed out 2 years ago and was not upgraded to biventricular. She is paced less than 1/10 of 1% in the right ventricle.She had a myocardial viability study 2017 with extensive scar and minimal peri-infarct ischemia correlating very nicely with her recent myocardial perfusion images. Unfortunately she's had a progressive steady deterioration of left ventricular ejection fraction.  With her CK D at this time I would avoid coronary angiography. At this timeI would not advise CRT upgrade as her problem here is extensive scar and not dyssynchrony.  We discussed her progressive deterioration and decided to optimize treatment by switching from ARB to ARNI. We discussed the potential of LVAD and after optimizationI I will refer to or heart failure specialist for consideration of a  cardio metabolic stress test and discussion of advanced heart failure modalities.we also discussed palliative care.   Compliance with diet, lifestyle and medications:  yes Past Medical History:  Diagnosis Date  . Acquired hypothyroidism 05/01/2015  . Angina pectoris ([place]) 07/13/2015  . Aspirin long-term use 03/07/2016  . Benign essential hypertension 05/01/2015  . Bilateral carotid artery stenosis 03/07/2016  . Cardiomyopathy ([place]) 05/01/2015   Overview:  EF 20% with aneurysm of the apex  . Carotid artery stenosis 06/21/2015  . Coronary  arteriosclerosis in native artery 05/01/2015   Overview:  Overview:  PCI and stent to LAD and RCA 2004 for MI DES to LAD for subtotal occlusion with no reflow 8/09  . Coronary artery disease of native artery of native heart with stable angina pectoris ([place]) 05/01/2015   Overview:  PCI and stent to LAD and RCA 2004 for MI DES to LAD for subtotal occlusion with no reflow 2009  . ICD (implantable cardioverter-defibrillator) in place 05/01/2015   Overview:  Medtronic  . Left heart failure ([place]) 05/01/2015  . MI (myocardial infarction) ([place]) 05/01/2015  . On amiodarone therapy 08/15/2016  . Pure hypercholesterolemia 05/01/2015  . Stage 3 chronic kidney disease 12/18/2016  . Syncope and collapse 05/01/2015  . Type 2 diabetes mellitus with stage 3 chronic kidney disease, with long-term current use of insulin ([place]) 07/17/2016  . VT (ventricular tachycardia) ([place]) 05/01/2015    Past Surgical History:  Procedure Laterality Date  . ABDOMINAL HYSTERECTOMY    . ANKLE FRACTURE SURGERY Bilateral    6 months apart, screws  . CARDIAC CATHETERIZATION     2003, 2009, 2016  . CAROTID STENT  06/21/2015   thoracic ao, 4 vessel cerebral arteriogram & rt internal carotid artery pta/stent CPT 58099  . CATARACT EXTRACTION Right    With lens replaced  . COLONOSCOPY  2013  . CORONARY STENT PLACEMENT    . PACEMAKER PLACEMENT     has two, one working and one not, could not remove old one, Dr. Elonda Husky     Current Medications: No outpatient prescriptions have been marked as taking for the 06/05/17 encounter (Office Visit) with Richardo Priest, MD.     Allergies:   Shellfish allergy and Sulfa antibiotics   Social History   Social History  . Marital status: Married    Spouse name: N/A  . Number of children: N/A  . Years of education: N/A   Social History Main Topics  . Smoking status: Never Smoker  . Smokeless tobacco: Never Used  . Alcohol use No  . Drug use: No  . Sexual activity: Not Asked   Other Topics  Concern  . None   Social History Narrative  . None     Family History: The patient's family history includes Brain cancer in her mother; Breast cancer in her sister; Diabetes in her sister; Heart attack in her father; Heart disease in her father; Rectal cancer in her daughter. ROS:   Please see the history of present illness.    All other systems reviewed and are negative.  EKGs/Labs/Other Studies Reviewed:    The following studies were reviewed today: Jenna West  Gated Spect Myocardial Perfusion Imaging with Regadenoson Stress 2 Day Rest/Stress Protocol  Order# [id number]  Reading physician: Sanda Klein, MD Ordering physician: Richardo Priest, MD Study date: 06/02/17  Patient Information   Name MRN Description  Jenna West [id number] 71 y.o. Female  Result Notes   Notes recorded by Jossie Ng, RN on  06/03/2017 at 3:56 PM EDT Patient has appointment scheduled 06/05/17. ------  Notes recorded by Richardo Priest, MD on 06/03/2017 at 3:27 PM EDT I would like to see in office in the next week      Vitals   Height Weight BMI (Calculated)  5\' 5"  (1.651 m) 199 lb (90.3 kg) 33.12  Study Highlights     Nuclear stress EF: 6%.  There was no ST segment deviation noted during stress.  Defect 1: There is a large defect of severe severity present in the mid anterior, mid anteroseptal, mid inferoseptal, apical anterior, apical septal, apical inferior, apical lateral and apex location.  Findings consistent with prior myocardial infarction.  This is a high risk study.  The left ventricular ejection fraction is severely decreased (<30%).   High risk stress nuclear study with a very large and severe scar/aneurysm involving the distal 2/3 of the left ventricular myocardium. There is a minimal peri-infarct ischemia, but meaningful areas of reversible ischemia are not identified.     Recent Labs: 05/29/2017: ALT 19; BNP 251.9; BUN 38; Creatinine, Ser 2.10;  Potassium 5.3; Sodium 140; TSH 6.770  Recent Lipid Panel No results found for: CHOL, TRIG, HDL, CHOLHDL, VLDL, LDLCALC, LDLDIRECT  Physical Exam:    VS:  BP 136/66 (BP Location: Right Arm, Patient Position: Sitting)   Pulse 73   Ht 5\' 5"  (1.651 m)   Wt 198 lb 1.9 oz (89.9 kg)   SpO2 95%   BMI 32.97 kg/m     Wt Readings from Last 3 Encounters:  06/05/17 198 lb 1.9 oz (89.9 kg)  06/02/17 199 lb (90.3 kg)  05/29/17 199 lb 12.8 oz (90.6 kg)     GEN:  Well nourished, well developed in no acute distress HEENT: Normal NECK: No JVD; No carotid bruits LYMPHATICS: No lymphadenopathy CARDIAC: soft S1 no S3RRR, no murmurs, rubs, gallops RESPIRATORY:  Clear to auscultation without rales, wheezing or rhonchi  ABDOMEN: Soft, non-tender, non-distended MUSCULOSKELETAL:  No edema; No deformity  SKIN: Warm and dry NEUROLOGIC:  Alert and oriented x 3 PSYCHIATRIC:  Normal affect    Signed, Shirlee More, MD  06/05/2017 10:52 AM    [place]

## 2017-06-05 ENCOUNTER — Encounter: Payer: Self-pay | Admitting: Cardiology

## 2017-06-05 ENCOUNTER — Ambulatory Visit (INDEPENDENT_AMBULATORY_CARE_PROVIDER_SITE_OTHER): Payer: Medicare Other | Admitting: Cardiology

## 2017-06-05 VITALS — BP 136/66 | HR 73 | Ht 65.0 in | Wt 198.1 lb

## 2017-06-05 DIAGNOSIS — Z95810 Presence of automatic (implantable) cardiac defibrillator: Secondary | ICD-10-CM

## 2017-06-05 DIAGNOSIS — I5022 Chronic systolic (congestive) heart failure: Secondary | ICD-10-CM | POA: Diagnosis not present

## 2017-06-05 DIAGNOSIS — I25118 Atherosclerotic heart disease of native coronary artery with other forms of angina pectoris: Secondary | ICD-10-CM | POA: Diagnosis not present

## 2017-06-05 DIAGNOSIS — I255 Ischemic cardiomyopathy: Secondary | ICD-10-CM | POA: Diagnosis not present

## 2017-06-05 MED ORDER — SACUBITRIL-VALSARTAN 24-26 MG PO TABS
1.0000 | ORAL_TABLET | Freq: Two times a day (BID) | ORAL | 3 refills | Status: DC
Start: 2017-06-05 — End: 2017-06-19

## 2017-06-05 NOTE — Patient Instructions (Addendum)
Medication Instructions:  Your physician has recommended you make the following change in your medication:  STOP candesartan START sacubatril-valsartan (Entresto) 24-26 mg twice daily  Labwork: None  Testing/Procedures: None  Follow-Up: Your physician recommends that you schedule a follow-up appointment in: 2 weeks.  You will receive a phone call to schedule with Dr. Camnitz-electrophysiology.   Any Other Special Instructions Will Be Listed Below (If Applicable).     If you need a refill on your cardiac medications before your next appointment, please call your pharmacy.    Heart Failure  Weigh yourself every morning when you first wake up and record on a calender or note pad, bring this to your office visits. Using a pill tender can help with taking your medications consistently.  Limit your fluid intake to 2 liters daily  Limit your sodium intake to less than 2-3 grams daily. Ask if you need dietary teaching.  If you gain more than 3 pounds (from your dry weight ), double your dose of diuretic for the day.  If you gain more than 5 pounds (from your dry weight), double your dose of lasix and call your heart failure doctor.  Please do not smoke tobacco since it is very bad for your heart.  Please do not drink alcohol since it can worsen your heart failure.Also avoid OTC nonsteroidal drugs, such as advil, aleve and motrin.  Try to exercise for at least 30 minutes every day because this will help your heart be more efficient. You may be eligible for supervised cardiac rehab, ask your physician.    KNOW YOUR HEART FAILURE ZONES  Green Zone (Your Goal):  Marland Kitchen No shortness of breath or trouble bleeding . No weight gain of more than 3 pounds in one day or 5 pounds in a week . No swelling in your ankles, feet, stomach, or hands . No chest discomfort, heaviness or pain  Yellow Zone (Call the Doctor to get help!): Having 1 or more of the following: . Weight gain of 3  pounds in 1 day or 5 pounds in 1 week . More swelling of your feet, ankles, stomach, or hands . It is harder to breath when lying down. You need to sit up . Chest discomfort, heaviness, or pain . You feel more tired or have less energy than normal . New or worsening dizziness . Dry hacking cough . You feel uneasy and you know something is not right  Red Zone (Call 911-EMERGENCY): . Struggling to breathe. This does not go away when you sit up. . Stronger and more regular amounts of chest discomfort . New confusion or can't think clearly . Fainting or near fainting   Resources form the American heart Association: RetroDivas.ch.jsp

## 2017-06-19 ENCOUNTER — Ambulatory Visit (INDEPENDENT_AMBULATORY_CARE_PROVIDER_SITE_OTHER): Payer: Medicare Other | Admitting: Cardiology

## 2017-06-19 ENCOUNTER — Encounter: Payer: Self-pay | Admitting: Cardiology

## 2017-06-19 VITALS — BP 112/62 | HR 72 | Ht 65.0 in | Wt 200.1 lb

## 2017-06-19 DIAGNOSIS — N184 Chronic kidney disease, stage 4 (severe): Secondary | ICD-10-CM | POA: Diagnosis not present

## 2017-06-19 DIAGNOSIS — I5022 Chronic systolic (congestive) heart failure: Secondary | ICD-10-CM | POA: Diagnosis not present

## 2017-06-19 DIAGNOSIS — I255 Ischemic cardiomyopathy: Secondary | ICD-10-CM | POA: Diagnosis not present

## 2017-06-19 MED ORDER — SACUBITRIL-VALSARTAN 49-51 MG PO TABS: 1.0000 | ORAL_TABLET | Freq: Two times a day (BID) | ORAL | 2 refills | Status: DC

## 2017-06-19 NOTE — Progress Notes (Signed)
Cardiology Office Note:    Date:  06/19/2017   ID:  JODILYN GIESE, DOB 11/09/1945, MRN [id number]  PCP:  Ronita Hipps, MD  Cardiologist:  Shirlee More, MD    Referring MD: Ronita Hipps, MD    ASSESSMENT:    1. Chronic systolic heart failure ([place])   2. CKD (chronic kidney disease) stage 4, GFR 15-29 ml/min (HCC)    PLAN:    In order of problems listed above:  1. Stable she has tolerated ARNI and will uptitrate to intermediate dose, recheck renal function and BNP. Continue her other guideline directed medication loop and distal diuretic and beta blocker 2. Stable recheck renal function   Next appointment: 4 weeks   Medication Adjustments/Labs and Tests Ordered: Current medicines are reviewed at length with the patient today.  Concerns regarding medicines are outlined above.  No orders of the defined types were placed in this encounter.  No orders of the defined types were placed in this encounter.   Chief Complaint  Patient presents with  . Follow-up    2 week flup after starting Entresto   . Congestive Heart Failure    History of Present Illness:    Jenna West is a 71 y.o. female with a hx of  CAD with PCI of LAD and RCA 2004 and LAD 2009 with severe LV dysfunction EF < 20% , CHF, Hypertension, VT on amiodarone and has an ICD, and stage 4 CKD l last seen 2 weeks ago and transitioned to ARNI.Marland KitchenHer MPI showed extensive scar and LV dysfunction, EF 6%. Her EF in 2016 was 40% and did not vave CRT, subsequently 25%.We discussed her progressive deterioration and decided to optimize treatment by switching from ARB to ARNI. We discussed the potential of LVAD and after optimizationI I will refer to or heart failure specialist for consideration of a cardio metabolic stress test and discussion of advanced heart failure modalities.we also discussed palliative care. Compliance with diet, lifestyle and medications: yes Past Medical History:  Diagnosis Date  . Acquired  hypothyroidism 05/01/2015  . Angina pectoris ([place]) 07/13/2015  . Aspirin long-term use 03/07/2016  . Benign essential hypertension 05/01/2015  . Bilateral carotid artery stenosis 03/07/2016  . 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 Carotid artery stenosis 06/21/2015  . Coronary arteriosclerosis in native artery 05/01/2015   Overview:  Overview:  PCI and stent to LAD and RCA 2004 for MI DES to LAD for subtotal occlusion with no reflow 8/09  . Coronary artery disease of native artery of native heart with stable angina pectoris ([place]) 05/01/2015   Overview:  PCI and stent to LAD and RCA 2004 for MI DES to LAD for subtotal occlusion with no reflow 2009  . ICD (implantable cardioverter-defibrillator) in place 05/01/2015   Overview:  Medtronic  . Left heart failure ([place]) 05/01/2015  . MI (myocardial infarction) ([place]) 05/01/2015  . On amiodarone therapy 08/15/2016  . Pure hypercholesterolemia 05/01/2015  . Stage 3 chronic kidney disease ([place]) 12/18/2016  . Syncope and collapse 05/01/2015  . Type 2 diabetes mellitus with stage 3 chronic kidney disease, with long-term current use of insulin ([place]) 07/17/2016  . VT (ventricular tachycardia) ([place]) 05/01/2015    Past Surgical History:  Procedure Laterality Date  . ABDOMINAL HYSTERECTOMY    . ANKLE FRACTURE SURGERY Bilateral    6 months apart, screws  . CARDIAC CATHETERIZATION     2003, 2009, 2016  . CAROTID STENT  06/21/2015   thoracic ao, 4 vessel cerebral arteriogram & rt internal carotid artery pta/stent CPT 32122  . CATARACT EXTRACTION Right    With lens replaced  . COLONOSCOPY  2013  . CORONARY STENT PLACEMENT    . PACEMAKER PLACEMENT     has two, one working and one not, could not remove old one, Dr. Elonda Husky     Current Medications: Current Meds  Medication Sig  . amiodarone (PACERONE) 200 MG tablet Take 0.5 mg by mouth as directed. Take on Mon, Wed, and Fri  . aspirin EC 81 MG tablet Take 81 mg by mouth  daily.  Marland Kitchen atorvastatin (LIPITOR) 80 MG tablet Take 80 mg by mouth daily.  . carvedilol (COREG) 12.5 MG tablet Take 6.25 mg by mouth 2 (two) times daily.  . clopidogrel (PLAVIX) 75 MG tablet Take 75 mg by mouth daily.  . furosemide (LASIX) 20 MG tablet Take 1 tablet (20 mg total) by mouth daily.  Marland Kitchen HUMALOG 100 UNIT/ML injection Inject 20 units in the am, 20 units at lunch, and 25 units at bedtime  . LANTUS SOLOSTAR 100 UNIT/ML Solostar Pen Inject 10 units at bedtime  . levothyroxine (SYNTHROID, LEVOTHROID) 50 MCG tablet Take 50 mcg by mouth daily.  . Multiple Vitamin (MULTI-VITAMINS) TABS Take 1 tablet by mouth daily.  . pantoprazole (PROTONIX) 40 MG tablet Take 1 tablet (40 mg total) by mouth daily.  . ranolazine (RANEXA) 1000 MG SR tablet Take 1,000 mg by mouth 2 (two) times daily.  . sacubitril-valsartan (ENTRESTO) 24-26 MG Take 1 tablet by mouth 2 (two) times daily.  . sertraline (ZOLOFT) 25 MG tablet Take 25 mg by mouth daily.  Marland Kitchen spironolactone (ALDACTONE) 25 MG tablet Take 12.5 mg by mouth daily.  Marland Kitchen zolpidem (AMBIEN CR) 12.5 MG CR tablet Take 12.5 mg by mouth at bedtime.     Allergies:   Shellfish allergy and Sulfa antibiotics   Social History   Social History  . Marital status: Married    Spouse name: N/A  . Number of children: N/A  . Years of education: N/A   Social History Main Topics  . Smoking status: Never Smoker  . Smokeless tobacco: Never Used  . Alcohol use No  . Drug use: No  . Sexual activity: Not Asked   Other Topics Concern  . None   Social History Narrative  . None     Family History: The patient's family history includes Brain cancer in her mother; Breast cancer in her sister; Diabetes in her sister; Heart attack in her father; Heart disease in her father; Rectal cancer in her daughter. ROS:   Please see the history of present illness.    All other systems reviewed and are negative.  EKGs/Labs/Other Studies Reviewed:    The following studies were  reviewed today:  Recent Labs: 05/29/2017: ALT 19; BNP 251.9; BUN 38; Creatinine, Ser 2.10; Potassium 5.3; Sodium 140; TSH 6.770  Recent Lipid Panel No results found for: CHOL, TRIG, HDL, CHOLHDL, VLDL, LDLCALC, LDLDIRECT  Physical Exam:    VS:  BP 112/62 (BP Location: Right Arm, Patient Position: Sitting)   Pulse 72   Ht 5\' 5"  (1.651 m)   Wt 200 lb 1.9 oz (90.8 kg)   SpO2 97%   BMI 33.30 kg/m     Wt Readings from Last 3 Encounters:  06/19/17 200 lb 1.9 oz (90.8 kg)  06/05/17 198 lb 1.9 oz (89.9 kg)  06/02/17 199 lb (90.3 kg)     GEN:  Well nourished, well developed in no acute distress HEENT: Normal NECK: No JVD; No carotid bruits LYMPHATICS: No lymphadenopathy CARDIAC: RRR, no murmurs, rubs, gallops RESPIRATORY:  Clear to auscultation without rales, wheezing or rhonchi   ABDOMEN: Soft, non-tender, non-distended MUSCULOSKELETAL:  No edema; No deformity  SKIN: Warm and dry NEUROLOGIC:  Alert and oriented x 3 PSYCHIATRIC:  Normal affect    Signed, Shirlee More, MD  06/19/2017 10:09 AM    [place]

## 2017-06-19 NOTE — Patient Instructions (Addendum)
Medication Instructions:   STOP: Entresto 24mg /26mg  one tablet twice daily Start: Entresto 49mg /51mg  one tablet twice daily  Labwork:  Your physician recommends that you have labs today: BMP, BNP    Testing/Procedures: NONE   Follow-Up: Your physician recommends that you schedule a follow-up appointment in:4 weeks   Any Other Special Instructions Will Be Listed Below (If Applicable).     If you need a refill on your cardiac medications before your next appointment, please call your pharmacy.      Marland Kitchen  Heart Failure  Weigh yourself every morning when you first wake up and record on a calender or note pad, bring this to your office visits. Using a pill tender can help with taking your medications consistently.  Limit your fluid intake to 2 liters daily  Limit your sodium intake to less than 2-3 grams daily. Ask if you need dietary teaching.  If you gain more than 3 pounds (from your dry weight ), double your dose of diuretic for the day.  If you gain more than 5 pounds (from your dry weight), double your dose of lasix and call your heart failure doctor.  Please do not smoke tobacco since it is very bad for your heart.  Please do not drink alcohol since it can worsen your heart failure.Also avoid OTC nonsteroidal drugs, such as advil, aleve and motrin.  Try to exercise for at least 30 minutes every day because this will help your heart be more efficient. You may be eligible for supervised cardiac rehab, ask your physician.

## 2017-06-20 ENCOUNTER — Telehealth: Payer: Self-pay

## 2017-06-20 LAB — BASIC METABOLIC PANEL
BUN / CREAT RATIO: 22 (ref 12–28)
BUN: 45 mg/dL — AB (ref 8–27)
CO2: 20 mmol/L (ref 20–29)
CREATININE: 2.03 mg/dL — AB (ref 0.57–1.00)
Calcium: 8.8 mg/dL (ref 8.7–10.3)
Chloride: 103 mmol/L (ref 96–106)
GFR calc non Af Amer: 24 mL/min/{1.73_m2} — ABNORMAL LOW (ref >59)
GFR, EST AFRICAN AMERICAN: 28 mL/min/{1.73_m2} — AB (ref >59)
Glucose: 194 mg/dL — ABNORMAL HIGH (ref 65–99)
Potassium: 4.9 mmol/L (ref 3.5–5.2)
SODIUM: 138 mmol/L (ref 134–144)

## 2017-06-20 LAB — BRAIN NATRIURETIC PEPTIDE: BNP: 478 pg/mL — ABNORMAL HIGH (ref 0.0–100.0)

## 2017-06-20 NOTE — Telephone Encounter (Signed)
Patient returned your call.cn

## 2017-06-20 NOTE — Telephone Encounter (Signed)
Pt notified of stable lab results per Dr Bettina Gavia.  Pt verbalized understanding.

## 2017-06-24 ENCOUNTER — Telehealth: Payer: Self-pay | Admitting: Cardiology

## 2017-06-24 NOTE — Telephone Encounter (Signed)
Left message for pt to return call.

## 2017-06-24 NOTE — Telephone Encounter (Signed)
Pt states that she is going to apply for pt assistance for [employer].  She is completing her portion of the paperwork, and [employer] will be faxing copies for Dr Bettina Gavia to complete.  Will notify pt if paperwork does not get sent to our office.

## 2017-06-24 NOTE — Telephone Encounter (Signed)
Wants to talk to you about them changing the milligrams on her medicine

## 2017-06-25 ENCOUNTER — Encounter: Payer: Self-pay | Admitting: Cardiology

## 2017-06-25 ENCOUNTER — Ambulatory Visit (INDEPENDENT_AMBULATORY_CARE_PROVIDER_SITE_OTHER): Payer: Medicare Other | Admitting: Cardiology

## 2017-06-25 VITALS — BP 124/66 | HR 94 | Ht 65.0 in | Wt 197.6 lb

## 2017-06-25 DIAGNOSIS — I255 Ischemic cardiomyopathy: Secondary | ICD-10-CM | POA: Diagnosis not present

## 2017-06-25 DIAGNOSIS — Z4502 Encounter for adjustment and management of automatic implantable cardiac defibrillator: Secondary | ICD-10-CM | POA: Diagnosis not present

## 2017-06-25 DIAGNOSIS — I472 Ventricular tachycardia, unspecified: Secondary | ICD-10-CM

## 2017-06-25 DIAGNOSIS — I5022 Chronic systolic (congestive) heart failure: Secondary | ICD-10-CM | POA: Diagnosis not present

## 2017-06-25 LAB — CUP PACEART INCLINIC DEVICE CHECK
Brady Statistic AP VP Percent: 0.04 %
Brady Statistic AP VS Percent: 99.85 %
Brady Statistic AS VP Percent: 0 %
Brady Statistic AS VS Percent: 0.11 %
Date Time Interrogation Session: 20181010160122
HighPow Impedance: 38 Ohm
HighPow Impedance: 46 Ohm
Implantable Lead Implant Date: 20091013
Implantable Lead Location: 753859
Lead Channel Impedance Value: 342 Ohm
Lead Channel Pacing Threshold Pulse Width: 0.4 ms
Lead Channel Sensing Intrinsic Amplitude: 19.875 mV
Lead Channel Setting Pacing Amplitude: 2 V
Lead Channel Setting Pacing Pulse Width: 0.4 ms
Lead Channel Setting Sensing Sensitivity: 0.3 mV
MDC IDC LEAD IMPLANT DT: 20091013
MDC IDC LEAD LOCATION: 753860
MDC IDC MSMT BATTERY REMAINING LONGEVITY: 66 mo
MDC IDC MSMT BATTERY VOLTAGE: 2.98 V
MDC IDC MSMT LEADCHNL RA PACING THRESHOLD AMPLITUDE: 0.75 V
MDC IDC MSMT LEADCHNL RA PACING THRESHOLD PULSEWIDTH: 0.4 ms
MDC IDC MSMT LEADCHNL RA SENSING INTR AMPL: 3.25 mV
MDC IDC MSMT LEADCHNL RV IMPEDANCE VALUE: 342 Ohm
MDC IDC MSMT LEADCHNL RV IMPEDANCE VALUE: 437 Ohm
MDC IDC MSMT LEADCHNL RV PACING THRESHOLD AMPLITUDE: 1 V
MDC IDC PG IMPLANT DT: 20150521
MDC IDC SET LEADCHNL RA PACING AMPLITUDE: 1.5 V
MDC IDC STAT BRADY RA PERCENT PACED: 99.87 %
MDC IDC STAT BRADY RV PERCENT PACED: 0.04 %

## 2017-06-25 NOTE — Progress Notes (Signed)
Electrophysiology Office Note   Date:  06/25/2017   ID:  Jenna West, DOB 1946/03/05, MRN [id number]  PCP:  Jenna Hipps, MD  Cardiologist:  Jenna West Primary Electrophysiologist:  Jaceyon Strole Jenna Leeds, MD    Chief Complaint  Patient presents with  . Defib Check    Ischemic cardiomyopathy/Chronic systolic HF/VT     History of Present Illness: Jenna West is a 71 y.o. female who is being seen today for the evaluation of CHF at the request of Jenna West, Hilton Cork, MD. Presenting today for electrophysiology evaluation. History of coronary disease status post PCI to the LAD and RCA in 2000 for an LAD in 2009 with severe LV dysfunction and an EF less than 20%, CHF, hypertension, VT on amiodarone with an ICD, stage IV CKG. She had a Myoview that showed extensive scar and LV dysfunction with an EF of 60%. EF in 2016 was 40%.    Today, she denies symptoms of palpitations, chest pain, orthopnea, PND, lower extremity edema, claudication, dizziness, presyncope, syncope, bleeding, or neurologic sequela. The patient is tolerating medications without difficulties. She has shortness of breath with exertion, but that is at her baseline. Device interrogation showed one episode of VT that lasted 12 seconds in August. She did have symptoms. This was around the time that her sister was going through end-of-life issues. She has had no further issues. She was put on Entresto 2 weeks ago and has felt better.  Past Medical History:  Diagnosis Date  . Acquired hypothyroidism 05/01/2015  . Angina pectoris ([place]) 07/13/2015  . Aspirin long-term use 03/07/2016  . Benign essential hypertension 05/01/2015  . Bilateral carotid artery stenosis 03/07/2016  . 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 Carotid artery stenosis 06/21/2015  . Coronary arteriosclerosis in native artery 05/01/2015   Overview:  Overview:  PCI and stent to LAD and RCA 2004 for MI DES to LAD for subtotal  occlusion with no reflow 8/09  . Coronary artery disease of native artery of native heart with stable angina pectoris ([place]) 05/01/2015   Overview:  PCI and stent to LAD and RCA 2004 for MI DES to LAD for subtotal occlusion with no reflow 2009  . ICD (implantable cardioverter-defibrillator) in place 05/01/2015   Overview:  Medtronic  . Left heart failure ([place]) 05/01/2015  . MI (myocardial infarction) ([place]) 05/01/2015  . On amiodarone therapy 08/15/2016  . Pure hypercholesterolemia 05/01/2015  . Stage 3 chronic kidney disease ([place]) 12/18/2016  . Syncope and collapse 05/01/2015  . Type 2 diabetes mellitus with stage 3 chronic kidney disease, with long-term current use of insulin ([place]) 07/17/2016  . VT (ventricular tachycardia) ([place]) 05/01/2015   Past Surgical History:  Procedure Laterality Date  . ABDOMINAL HYSTERECTOMY    . ANKLE FRACTURE SURGERY Bilateral    6 months apart, screws  . CARDIAC CATHETERIZATION     2003, 2009, 2016  . CAROTID STENT  06/21/2015   thoracic ao, 4 vessel cerebral arteriogram & rt internal carotid artery pta/stent CPT 66063  . CATARACT EXTRACTION Right    With lens replaced  . COLONOSCOPY  2013  . CORONARY STENT PLACEMENT    . PACEMAKER PLACEMENT     has two, one working and one not, could not remove old one, Dr. Elonda Husky      Current Outpatient Prescriptions  Medication Sig Dispense Refill  . amiodarone (PACERONE) 200 MG tablet Take 0.5 mg by mouth as directed. Take on  Mon, Wed, and Fri    . aspirin EC 81 MG tablet Take 81 mg by mouth daily.    Marland Kitchen atorvastatin (LIPITOR) 80 MG tablet Take 80 mg by mouth daily.    . carvedilol (COREG) 12.5 MG tablet Take 6.25 mg by mouth 2 (two) times daily.    . clopidogrel (PLAVIX) 75 MG tablet Take 75 mg by mouth daily.    . furosemide (LASIX) 20 MG tablet Take 1 tablet (20 mg total) by mouth daily. 90 tablet 3  . HUMALOG 100 UNIT/ML injection Inject 20 units in the am, 20 units at lunch, and 25 units at bedtime    . LANTUS  SOLOSTAR 100 UNIT/ML Solostar Pen Inject 10 units at bedtime    . levothyroxine (SYNTHROID, LEVOTHROID) 50 MCG tablet Take 50 mcg by mouth daily.    . Multiple Vitamin (MULTI-VITAMINS) TABS Take 1 tablet by mouth daily.    . pantoprazole (PROTONIX) 40 MG tablet Take 1 tablet (40 mg total) by mouth daily. 30 tablet 11  . ranolazine (RANEXA) 1000 MG SR tablet Take 1,000 mg by mouth 2 (two) times daily.    . sacubitril-valsartan (ENTRESTO) 49-51 MG Take 1 tablet by mouth 2 (two) times daily. 60 tablet 2  . sertraline (ZOLOFT) 25 MG tablet Take 25 mg by mouth daily.    Marland Kitchen spironolactone (ALDACTONE) 25 MG tablet Take 12.5 mg by mouth daily.    Marland Kitchen zolpidem (AMBIEN CR) 12.5 MG CR tablet Take 12.5 mg by mouth at bedtime.     No current facility-administered medications for this visit.     Allergies:   Shellfish allergy and Sulfa antibiotics   Social History:  The patient  reports that she has never smoked. She has never used smokeless tobacco. She reports that she does not drink alcohol or use drugs.   Family History:  The patient's family history includes Brain cancer in her mother; Breast cancer in her sister; Diabetes in her sister; Heart attack in her father; Heart disease in her father; Rectal cancer in her daughter.    ROS:  Please see the history of present illness.   Otherwise, review of systems is positive for none.   All other systems are reviewed and negative.    PHYSICAL EXAM: VS:  BP 124/66   Pulse 94   Ht 5\' 5"  (1.651 m)   Wt 197 lb 9.6 oz (89.6 kg)   SpO2 98%   BMI 32.88 kg/m  , BMI Body mass index is 32.88 kg/m. GEN: Well nourished, well developed, in no acute distress  HEENT: normal  Neck: no JVD, carotid bruits, or masses Cardiac: RRR; no murmurs, rubs, or gallops,no edema  Respiratory:  clear to auscultation bilaterally, normal work of breathing GI: soft, nontender, nondistended, + BS MS: no deformity or atrophy  Skin: warm and dry,  device pocket is well  healed Neuro:  Strength and sensation are intact Psych: euthymic mood, full affect  EKG:  EKG is not ordered today. Personal review of the ekg ordered 05/29/17  shows A paced, diffuse T wave abnormalities   Device interrogation is reviewed today in detail.  See PaceArt for details.   Recent Labs: 05/29/2017: ALT 19; TSH 6.770 06/19/2017: BNP 478.0; BUN 45; Creatinine, Ser 2.03; Potassium 4.9; Sodium 138    Lipid Panel  No results found for: CHOL, TRIG, HDL, CHOLHDL, VLDL, LDLCALC, LDLDIRECT   Wt Readings from Last 3 Encounters:  06/25/17 197 lb 9.6 oz (89.6 kg)  06/19/17 200 lb 1.9  oz (90.8 kg)  06/05/17 198 lb 1.9 oz (89.9 kg)      Other studies Reviewed: Additional studies/ records that were reviewed today include: Myoview 06/03/17  Review of the above records today demonstrates:   Nuclear stress EF: 6%.  There was no ST segment deviation noted during stress.  Defect 1: There is a large defect of severe severity present in the mid anterior, mid anteroseptal, mid inferoseptal, apical anterior, apical septal, apical inferior, apical lateral and apex location.  Findings consistent with prior myocardial infarction.  This is a high risk study.  The left ventricular ejection fraction is severely decreased (<30%).   High risk stress nuclear study with a very large and severe scar/aneurysm involving the distal 2/3 of the left ventricular myocardium. There is a minimal peri-infarct ischemia, but meaningful areas of reversible ischemia are not identified.   ASSESSMENT AND PLAN:  1.  Chronic systolic heart failure due to ischemic cardiomyopathy: Currently on carvedilol and Entresto.Is feeling much better since switching to [facility]. Continue current management.  2. Coronary artery disease with chronic stable angina: Feeling well without complaints. Continue current management.  3. Ventricular tachycardia: Seen on her device interrogation. Had 12 seconds in August. Has since  been started on Entresto and is feeling improved. We'll make no further changes at this time, but may need to increase amiodarone in the future if she has more VT.    Current medicines are reviewed at length with the patient today.   The patient does not have concerns regarding her medicines.  The following changes were made today:  none  Labs/ tests ordered today include:  No orders of the defined types were placed in this encounter.    Disposition:   FU with Allani Reber 1 year  Signed, Latesia Norrington Jenna Leeds, MD  06/25/2017 12:35 PM     [facility] [address] [place] [address] [phone] (office) [phone] (fax)

## 2017-06-25 NOTE — Patient Instructions (Signed)
Medication Instructions:  Your physician recommends that you continue on your current medications as directed. Please refer to the Current Medication list given to you today.  Labwork: None ordered  Testing/Procedures: None ordered  Follow-Up: Remote monitoring is used to monitor your Pacemaker or ICD from home. This monitoring reduces the number of office visits required to check your device to one time per year. It allows [place] to keep an eye on the functioning of your device to ensure it is working properly. You are scheduled for a device check from home on 09/24/2017. You may send your transmission at any time that day. If you have a wireless device, the transmission will be sent automatically. After your physician reviews your transmission, you will receive a postcard with your next transmission date.  Your physician wants you to follow-up in: 1 year with Dr. Curt Bears.  You will receive a reminder letter in the mail two months in advance. If you don't receive a letter, please call our office to schedule the follow-up appointment.  --- If you need a refill on your cardiac medications before your next appointment, please call your pharmacy. ---  Thank you for choosing [facility]!!   Trinidad Curet, RN [phone]  Any Other Special Instructions Will Be Listed Below (If Applicable).

## 2017-07-01 DIAGNOSIS — E039 Hypothyroidism, unspecified: Secondary | ICD-10-CM | POA: Diagnosis not present

## 2017-07-01 DIAGNOSIS — Z23 Encounter for immunization: Secondary | ICD-10-CM | POA: Diagnosis not present

## 2017-07-01 DIAGNOSIS — E119 Type 2 diabetes mellitus without complications: Secondary | ICD-10-CM | POA: Diagnosis not present

## 2017-07-01 DIAGNOSIS — N183 Chronic kidney disease, stage 3 (moderate): Secondary | ICD-10-CM | POA: Diagnosis not present

## 2017-07-01 DIAGNOSIS — Z79899 Other long term (current) drug therapy: Secondary | ICD-10-CM | POA: Diagnosis not present

## 2017-07-10 ENCOUNTER — Encounter: Payer: Self-pay | Admitting: Cardiology

## 2017-07-10 ENCOUNTER — Other Ambulatory Visit: Payer: Self-pay | Admitting: *Deleted

## 2017-07-10 ENCOUNTER — Ambulatory Visit (INDEPENDENT_AMBULATORY_CARE_PROVIDER_SITE_OTHER): Payer: Medicare Other | Admitting: Cardiology

## 2017-07-10 ENCOUNTER — Telehealth: Payer: Self-pay | Admitting: Cardiology

## 2017-07-10 VITALS — BP 120/64 | HR 70 | Ht 65.0 in | Wt 198.0 lb

## 2017-07-10 DIAGNOSIS — Z95810 Presence of automatic (implantable) cardiac defibrillator: Secondary | ICD-10-CM

## 2017-07-10 DIAGNOSIS — I5022 Chronic systolic (congestive) heart failure: Secondary | ICD-10-CM | POA: Diagnosis not present

## 2017-07-10 MED ORDER — SACUBITRIL-VALSARTAN 49-51 MG PO TABS: ORAL_TABLET | ORAL | 2 refills | Status: DC

## 2017-07-10 NOTE — Telephone Encounter (Signed)
Patient states she passed out the other day and hit the bed. She states that she was sweaty when it happened. Patient states she passed out again today with her husband at the [place] office this morning. This time she hit the floor. Blood pressure was 110/72. Entresto was doubled at last visit. Take she only took half dose. Patient also didn't know if she got dehydrated by taking Lasix daily. No chest pain, shortness of breath is no more than normal, dizzy when standing. Patient was en route to office. Advised patient we would call a medtronic rep to check her ICD. Patient verbalized understanding. Forwarding to Dr. Curt Bears' nurse so they are aware.

## 2017-07-10 NOTE — Progress Notes (Signed)
Cardiology Office Note:    Date:  07/11/2017   ID:  Jenna West, DOB 05-09-1946, MRN [id number]  PCP:  Ronita Hipps, MD  Cardiologist:  Shirlee More, MD    Referring MD: Ronita Hipps, MD    ASSESSMENT:    1. Chronic systolic heart failure ([place])   2. ICD (implantable cardioverter-defibrillator) in place    PLAN:    In order of problems listed above:  1. Due to hypotension from combined coronary carvedilol, she will take a 3-day hiatus and resume her ARNI single once daily midday.  Including RN will monitor sitting and standing blood pressure.  Her heart failure is well compensated at the present time and she is reached maximum dose  Of beta blocker and ARNI. 2. Stable function   Next appointment: 4 weeks   Medication Adjustments/Labs and Tests Ordered: Current medicines are reviewed at length with the patient today.  Concerns regarding medicines are outlined above.  No orders of the defined types were placed in this encounter.  Meds ordered this encounter  Medications  . sacubitril-valsartan (ENTRESTO) 49-51 MG    Sig: Take 1/2 tablet daily at mid day    Dispense:  60 tablet    Refill:  2    Chief Complaint  Patient presents with  . Loss of Consciousness  . Dizziness  . Excessive Sweating    History of Present Illness:    Jenna West is a 71 y.o. female with a hx of CAD with PCI of LAD and RCA 2004 and LAD 2009with severe LV dysfunction EF < 20% , CHF, Hypertension, VT on amiodarone and has an ICD, and stage 4 CKD l last seen 2 weeks ago and transitioned to ARNI.Marland KitchenHer MPI showed extensive scar and LV dysfunction, EF 6%. Her EF in 2016 was 40% and did not vave CRT, subsequently 25%.We discussed her progressive deterioration and decided to optimize treatment by switching from ARB to ARNI being seen as a work in for several syncopal episodes.  Since increasing her dose of ARNI she has been lightheaded and has had 2 episodes of syncope approximately 1 hour  after taking combined carvedilol and ARNI.  She has had her blood pressure checked and it has been less than 80 with the events and we interrogated her ICD in the office and she had no ventricular tachycardia fibrillation or device discharge.  Blood sugar is checked and she was not hypoglycemic.  Today I cannot demonstrate orthostatic hypotension. Compliance with diet, lifestyle and medications: Yes Past Medical History:  Diagnosis Date  . Acquired hypothyroidism 05/01/2015  . Angina pectoris ([place]) 07/13/2015  . Aspirin long-term use 03/07/2016  . Benign essential hypertension 05/01/2015  . Bilateral carotid artery stenosis 03/07/2016  . 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 Carotid artery stenosis 06/21/2015  . Coronary arteriosclerosis in native artery 05/01/2015   Overview:  Overview:  PCI and stent to LAD and RCA 2004 for MI DES to LAD for subtotal occlusion with no reflow 8/09  . Coronary artery disease of native artery of native heart with stable angina pectoris ([place]) 05/01/2015   Overview:  PCI and stent to LAD and RCA 2004 for MI DES to LAD for subtotal occlusion with no reflow 2009  . ICD (implantable cardioverter-defibrillator) in place 05/01/2015   Overview:  Medtronic  . Left heart failure ([place]) 05/01/2015  . MI (myocardial infarction) ([place]) 05/01/2015  . On amiodarone therapy 08/15/2016  .  Pure hypercholesterolemia 05/01/2015  . Stage 3 chronic kidney disease ([place]) 12/18/2016  . Syncope and collapse 05/01/2015  . Type 2 diabetes mellitus with stage 3 chronic kidney disease, with long-term current use of insulin ([place]) 07/17/2016  . VT (ventricular tachycardia) ([place]) 05/01/2015    Past Surgical History:  Procedure Laterality Date  . ABDOMINAL HYSTERECTOMY    . ANKLE FRACTURE SURGERY Bilateral    6 months apart, screws  . CARDIAC CATHETERIZATION     2003, 2009, 2016  . CAROTID STENT  06/21/2015   thoracic ao, 4 vessel cerebral arteriogram & rt  internal carotid artery pta/stent CPT 13086  . CATARACT EXTRACTION Right    With lens replaced  . COLONOSCOPY  2013  . CORONARY STENT PLACEMENT    . PACEMAKER PLACEMENT     has two, one working and one not, could not remove old one, Dr. Elonda Husky     Current Medications: Current Meds  Medication Sig  . amiodarone (PACERONE) 200 MG tablet Take 0.5 mg by mouth as directed. Take on Mon, Wed, and Fri  . aspirin EC 81 MG tablet Take 81 mg by mouth daily.  Marland Kitchen atorvastatin (LIPITOR) 80 MG tablet Take 80 mg by mouth daily.  . carvedilol (COREG) 12.5 MG tablet Take 6.25 mg by mouth 2 (two) times daily.  . clopidogrel (PLAVIX) 75 MG tablet Take 75 mg by mouth daily.  . furosemide (LASIX) 20 MG tablet Take 1 tablet (20 mg total) by mouth daily.  Marland Kitchen HUMALOG 100 UNIT/ML injection Inject 20 units in the am, 20 units at lunch, and 25 units at bedtime  . LANTUS SOLOSTAR 100 UNIT/ML Solostar Pen Inject 10 units at bedtime  . levothyroxine (SYNTHROID, LEVOTHROID) 75 MCG tablet   . Multiple Vitamin (MULTI-VITAMINS) TABS Take 1 tablet by mouth daily.  . pantoprazole (PROTONIX) 40 MG tablet Take 1 tablet (40 mg total) by mouth daily.  . ranolazine (RANEXA) 1000 MG SR tablet Take 1,000 mg by mouth 2 (two) times daily.  . sacubitril-valsartan (ENTRESTO) 49-51 MG Take 1/2 tablet daily at mid day  . sertraline (ZOLOFT) 25 MG tablet Take 25 mg by mouth daily.  Marland Kitchen spironolactone (ALDACTONE) 25 MG tablet Take 12.5 mg by mouth daily.  Marland Kitchen zolpidem (AMBIEN CR) 12.5 MG CR tablet Take 12.5 mg by mouth at bedtime.  . [DISCONTINUED] sacubitril-valsartan (ENTRESTO) 49-51 MG Take 1 tablet by mouth 2 (two) times daily.     Allergies:   Shellfish allergy and Sulfa antibiotics   Social History   Social History  . Marital status: Married    Spouse name: N/A  . Number of children: N/A  . Years of education: N/A   Social History Main Topics  . Smoking status: Never Smoker  . Smokeless tobacco: Never Used  . Alcohol use No    . Drug use: No  . Sexual activity: Not Asked   Other Topics Concern  . None   Social History Narrative  . None     Family History: The patient's family history includes Brain cancer in her mother; Breast cancer in her sister; Diabetes in her sister; Heart attack in her father; Heart disease in her father; Rectal cancer in her daughter. ROS:   Please see the history of present illness.    All other systems reviewed and are negative.  EKGs/Labs/Other Studies Reviewed:    The following studies were reviewed today: Her ICD was interrogated along with the device rep normal function no evidence of device therapy.  Her  mode was changed to DDDR.  Recent Labs: 05/29/2017: ALT 19; TSH 6.770 06/19/2017: BNP 478.0; BUN 45; Creatinine, Ser 2.03; Potassium 4.9; Sodium 138  Recent Lipid Panel No results found for: CHOL, TRIG, HDL, CHOLHDL, VLDL, LDLCALC, LDLDIRECT  Physical Exam:    VS:  BP 120/64 (BP Location: Right Arm, Patient Position: Sitting, Cuff Size: Normal)   Pulse 70   Ht 5\' 5"  (1.651 m)   Wt 198 lb (89.8 kg)   SpO2 98%   BMI 32.95 kg/m     Wt Readings from Last 3 Encounters:  07/10/17 198 lb (89.8 kg)  06/25/17 197 lb 9.6 oz (89.6 kg)  06/19/17 200 lb 1.9 oz (90.8 kg)    BP 122/60 sit and stand GEN:  Well nourished, well developed in no acute distress HEENT: Normal NECK: No JVD; No carotid bruits LYMPHATICS: No lymphadenopathy CARDIAC: RRR, no murmurs, rubs, gallops RESPIRATORY:  Clear to auscultation without rales, wheezing or rhonchi  ABDOMEN: Soft, non-tender, non-distended MUSCULOSKELETAL:  No edema; No deformity  SKIN: Warm and dry NEUROLOGIC:  Alert and oriented x 3 PSYCHIATRIC:  Normal affect    Signed, Shirlee More, MD  07/11/2017 7:59 AM    [place]

## 2017-07-10 NOTE — Patient Instructions (Addendum)
Medication Instructions:  Your physician has recommended you make the following change in your medication:  HOLD Entresto until Monday, then take 1/2 once daily at midday.  Labwork: None  Testing/Procedures: You had your device interrogated today.  Follow-Up: Your physician recommends that you schedule a follow-up appointment in: 4 weeks  Any Other Special Instructions Will Be Listed Below (If Applicable).     If you need a refill on your cardiac medications before your next appointment, please call your pharmacy.

## 2017-07-10 NOTE — Telephone Encounter (Signed)
Agree with ICD check. If no results, may need med adjustments.

## 2017-07-10 NOTE — Telephone Encounter (Signed)
Has been passing out, passed out 2 times in the past few days

## 2017-07-11 NOTE — Telephone Encounter (Signed)
[employer] paperwork received and fax back for review.

## 2017-07-15 DIAGNOSIS — Z1211 Encounter for screening for malignant neoplasm of colon: Secondary | ICD-10-CM | POA: Diagnosis not present

## 2017-07-17 ENCOUNTER — Ambulatory Visit: Payer: Medicare Other | Admitting: Cardiology

## 2017-07-21 ENCOUNTER — Other Ambulatory Visit: Payer: Self-pay

## 2017-07-21 MED ORDER — AMIODARONE HCL 200 MG PO TABS: 0.5000 mg | ORAL_TABLET | ORAL | 3 refills | Status: DC

## 2017-08-18 NOTE — Progress Notes (Signed)
Cardiology Office Note:    Date:  08/19/2017   ID:  Jenna West, DOB 06-06-1946, MRN [id number]  PCP:  Ronita Hipps, MD  Cardiologist:  Shirlee More, MD    Referring MD: Ronita Hipps, MD    ASSESSMENT:    1. Chronic systolic heart failure (HCC)    PLAN:    In order of problems listed above:  1. Stable, she is now able to tolerate her low dose ARNI without symptomatic hypotension we will continue the same medical regimen will check BMP and BNP today and plan to see him back in the office in 3 months.  She is on maximally tolerated treatment including her loop diuretic MRA beta-blocker and reduced dose with symptomatic hypotension and ARNI and reduced dose with hypotension.  I encouraged her to increase her activity.  Her CAD is stable her ICD is stable and she is transitioning care to my EP colleagues.  She will continue care for CAD which includes antiplatelet and statin.  She also is on low-dose amiodarone.  Unfortunately she did not bring a medication list to the office and is becoming confusing as she exits and will call back to reinforce with her the whether she is taken already and what dose and frequency.  She is instructed to always bring a list of medications weights and blood pressures with her to the office   Next appointment: 3 months   Medication Adjustments/Labs and Tests Ordered: Current medicines are reviewed at length with the patient today.  Concerns regarding medicines are outlined above.  Orders Placed This Encounter  Procedures  . Basic Metabolic Panel (BMET)  . B Nat Peptide   Meds ordered this encounter  Medications  . sacubitril-valsartan (ENTRESTO) 49-51 MG    Sig: Take 1/2 tablet daily twice daily    Dispense:  60 tablet    Refill:  2    Chief Complaint  Patient presents with  . Follow-up  . Congestive Heart Failure    History of Present Illness:    Jenna West is a 71 y.o. female with a hx of  CAD with PCI of LAD and RCA 2004  and LAD 2009with severe LV dysfunction EF <20% , CHF, Hypertension, VT on amiodarone and she has an ICD transitioned to [facility] for EP care, and stage 4 CKD  last seen one month ago with hypotension after starting ARNI.Marland Kitchen Compliance with diet, lifestyle and medications: Yes, blood pressure at home is stable running 110-115 over the mid 50s.  She had one episode of lightheadedness since dose reduction of beta-blocker and ARNI.  She is pleased with the quality of her life her spirits are better she can do activities such as shopping and housework but fatigues easily and is not involved in regular exercise program.  She has had no orthopnea edema chest pain palpitation or syncope. Past Medical History:  Diagnosis Date  . Acquired hypothyroidism 05/01/2015  . Angina pectoris ([place]) 07/13/2015  . Aspirin long-term use 03/07/2016  . Benign essential hypertension 05/01/2015  . Bilateral carotid artery stenosis 03/07/2016  . 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 Carotid artery stenosis 06/21/2015  . Coronary arteriosclerosis in native artery 05/01/2015   Overview:  Overview:  PCI and stent to LAD and RCA 2004 for MI DES to LAD for subtotal occlusion with no reflow 8/09  . Coronary artery disease of native artery of native heart with stable angina pectoris ([place]) 05/01/2015  Overview:  PCI and stent to LAD and RCA 2004 for MI DES to LAD for subtotal occlusion with no reflow 2009  . ICD (implantable cardioverter-defibrillator) in place 05/01/2015   Overview:  Medtronic  . Left heart failure ([place]) 05/01/2015  . MI (myocardial infarction) ([place]) 05/01/2015  . On amiodarone therapy 08/15/2016  . Pure hypercholesterolemia 05/01/2015  . Stage 3 chronic kidney disease ([place]) 12/18/2016  . Syncope and collapse 05/01/2015  . Type 2 diabetes mellitus with stage 3 chronic kidney disease, with long-term current use of insulin ([place]) 07/17/2016  . VT (ventricular tachycardia) ([place]) 05/01/2015     Past Surgical History:  Procedure Laterality Date  . ABDOMINAL HYSTERECTOMY    . ANKLE FRACTURE SURGERY Bilateral    6 months apart, screws  . CARDIAC CATHETERIZATION     2003, 2009, 2016  . CAROTID STENT  06/21/2015   thoracic ao, 4 vessel cerebral arteriogram & rt internal carotid artery pta/stent CPT 24401  . CATARACT EXTRACTION Right    With lens replaced  . COLONOSCOPY  2013  . CORONARY STENT PLACEMENT    . PACEMAKER PLACEMENT     has two, one working and one not, could not remove old one, Dr. Elonda Husky     Current Medications: Current Meds  Medication Sig  . amiodarone (PACERONE) 200 MG tablet Take 0.5 tablets (100 mg total) as directed by mouth. Take on Mon, Wed, and Fri  . aspirin EC 81 MG tablet Take 81 mg by mouth daily.  Marland Kitchen atorvastatin (LIPITOR) 80 MG tablet Take 80 mg by mouth daily.  . carvedilol (COREG) 12.5 MG tablet Take 6.25 mg by mouth 2 (two) times daily.  . clopidogrel (PLAVIX) 75 MG tablet Take 75 mg by mouth daily.  . furosemide (LASIX) 20 MG tablet Take 1 tablet (20 mg total) by mouth daily.  Marland Kitchen HUMALOG 100 UNIT/ML injection Inject 20 units in the am, 20 units at lunch, and 25 units at bedtime  . LANTUS SOLOSTAR 100 UNIT/ML Solostar Pen Inject 10 units at bedtime  . levothyroxine (SYNTHROID, LEVOTHROID) 75 MCG tablet   . Multiple Vitamin (MULTI-VITAMINS) TABS Take 1 tablet by mouth daily.  . pantoprazole (PROTONIX) 40 MG tablet Take 1 tablet (40 mg total) by mouth daily.  . ranolazine (RANEXA) 1000 MG SR tablet Take 1,000 mg by mouth 2 (two) times daily.  . sacubitril-valsartan (ENTRESTO) 49-51 MG Take 1/2 tablet daily twice daily  . sertraline (ZOLOFT) 25 MG tablet Take 25 mg by mouth daily.  Marland Kitchen spironolactone (ALDACTONE) 25 MG tablet Take 12.5 mg by mouth daily.  Marland Kitchen zolpidem (AMBIEN CR) 12.5 MG CR tablet Take 12.5 mg by mouth at bedtime.  . [DISCONTINUED] sacubitril-valsartan (ENTRESTO) 49-51 MG Take 1/2 tablet daily at mid day     Allergies:   Shellfish  allergy and Sulfa antibiotics   Social History   Socioeconomic History  . Marital status: Married    Spouse name: None  . Number of children: None  . Years of education: None  . Highest education level: None  Social Needs  . Financial resource strain: None  . Food insecurity - worry: None  . Food insecurity - inability: None  . Transportation needs - medical: None  . Transportation needs - non-medical: None  Occupational History  . None  Tobacco Use  . Smoking status: Never Smoker  . Smokeless tobacco: Never Used  Substance and Sexual Activity  . Alcohol use: No  . Drug use: No  . Sexual activity: None  Other Topics Concern  . None  Social History Narrative  . None     Family History: The patient's family history includes Brain cancer in her mother; Breast cancer in her sister; Diabetes in her sister; Heart attack in her father; Heart disease in her father; Rectal cancer in her daughter. ROS:   Please see the history of present illness.    All other systems reviewed and are negative.  EKGs/Labs/Other Studies Reviewed:    The following studies were reviewed today:  Recent Labs: 05/29/2017: ALT 19; TSH 6.770 06/19/2017: BNP 478.0; BUN 45; Creatinine, Ser 2.03; Potassium 4.9; Sodium 138  Recent Lipid Panel No results found for: CHOL, TRIG, HDL, CHOLHDL, VLDL, LDLCALC, LDLDIRECT  Physical Exam:    VS:  BP 100/60 (BP Location: Right Arm, Patient Position: Sitting, Cuff Size: Normal)   Pulse 75   Ht 5\' 5"  (1.651 m)   Wt 195 lb (88.5 kg)   SpO2 98%   BMI 32.45 kg/m     Wt Readings from Last 3 Encounters:  08/19/17 195 lb (88.5 kg)  07/10/17 198 lb (89.8 kg)  06/25/17 197 lb 9.6 oz (89.6 kg)     GEN:  Well nourished, well developed in no acute distress HEENT: Normal NECK: No JVD; No carotid bruits LYMPHATICS: No lymphadenopathy CARDIAC: Soft S1 no S3 RRR, no murmurs, rubs, gallops RESPIRATORY:  Clear to auscultation without rales, wheezing or rhonchi    ABDOMEN: Soft, non-tender, non-distended MUSCULOSKELETAL:  No edema; No deformity  SKIN: Warm and dry NEUROLOGIC:  Alert and oriented x 3 PSYCHIATRIC:  Normal affect    Signed, Shirlee More, MD  08/19/2017 10:00 AM    [facility]

## 2017-08-19 ENCOUNTER — Ambulatory Visit: Payer: Medicare Other | Admitting: Cardiology

## 2017-08-19 ENCOUNTER — Encounter: Payer: Self-pay | Admitting: Cardiology

## 2017-08-19 DIAGNOSIS — I5022 Chronic systolic (congestive) heart failure: Secondary | ICD-10-CM | POA: Diagnosis not present

## 2017-08-19 MED ORDER — SACUBITRIL-VALSARTAN 49-51 MG PO TABS: ORAL_TABLET | ORAL | 2 refills | Status: DC

## 2017-08-19 NOTE — Patient Instructions (Addendum)
Medication Instructions:  Your physician recommends that you continue on your current medications as directed. Please refer to the Current Medication list given to you today.   Labwork: Your physician recommends that you have lab work today: BNP and BMP    Testing/Procedures: None  Follow-Up Your physician recommends that you schedule a follow-up appointment in: 3 months    Any Other Special Instructions Will Be Listed Below (If Applicable).     If you need a refill on your cardiac medications before your next appointment, please call your pharmacy.       Heart Failure  Weigh yourself every morning when you first wake up and record on a calender or note pad, bring this to your office visits. Using a pill tender can help with taking your medications consistently.  Limit your fluid intake to 2 liters daily  Limit your sodium intake to less than 2-3 grams daily. Ask if you need dietary teaching.  If you gain more than 3 pounds (from your dry weight ), double your dose of diuretic for the day.  If you gain more than 5 pounds (from your dry weight), double your dose of lasix and call your heart failure doctor.  Please do not smoke tobacco since it is very bad for your heart.  Please do not drink alcohol since it can worsen your heart failure.Also avoid OTC nonsteroidal drugs, such as advil, aleve and motrin.  Try to exercise for at least 30 minutes every day because this will help your heart be more efficient. You may be eligible for supervised cardiac rehab, ask your physician.

## 2017-08-20 LAB — BASIC METABOLIC PANEL
BUN/Creatinine Ratio: 18 (ref 12–28)
BUN: 37 mg/dL — AB (ref 8–27)
CALCIUM: 8.9 mg/dL (ref 8.7–10.3)
CHLORIDE: 103 mmol/L (ref 96–106)
CO2: 24 mmol/L (ref 20–29)
CREATININE: 2.08 mg/dL — AB (ref 0.57–1.00)
GFR calc Af Amer: 27 mL/min/{1.73_m2} — ABNORMAL LOW (ref >59)
GFR calc non Af Amer: 23 mL/min/{1.73_m2} — ABNORMAL LOW (ref >59)
Glucose: 199 mg/dL — ABNORMAL HIGH (ref 65–99)
Potassium: 4.6 mmol/L (ref 3.5–5.2)
Sodium: 140 mmol/L (ref 134–144)

## 2017-08-20 LAB — BRAIN NATRIURETIC PEPTIDE: BNP: 348 pg/mL — AB (ref 0.0–100.0)

## 2017-09-24 ENCOUNTER — Telehealth: Payer: Self-pay | Admitting: Cardiology

## 2017-09-24 ENCOUNTER — Ambulatory Visit (INDEPENDENT_AMBULATORY_CARE_PROVIDER_SITE_OTHER): Payer: Medicare Other | Admitting: *Deleted

## 2017-09-24 DIAGNOSIS — I472 Ventricular tachycardia, unspecified: Secondary | ICD-10-CM

## 2017-09-24 NOTE — Telephone Encounter (Signed)
Spoke with pt and reminded pt of remote transmission that is due today. Pt verbalized understanding.   

## 2017-09-24 NOTE — Telephone Encounter (Signed)
Called pt to confirm remote appt. She wanted to know if MD could recommend a Vascular MD b/c her current Vascular MD is leaving the area.

## 2017-09-25 ENCOUNTER — Encounter: Payer: Self-pay | Admitting: Cardiology

## 2017-09-25 LAB — CUP PACEART REMOTE DEVICE CHECK
Battery Remaining Longevity: 54 mo
Battery Voltage: 2.97 V
Brady Statistic AP VP Percent: 39.83 %
Date Time Interrogation Session: 20190109204250
HIGH POWER IMPEDANCE MEASURED VALUE: 43 Ohm
HighPow Impedance: 53 Ohm
Implantable Lead Implant Date: 20091013
Implantable Lead Location: 753860
Implantable Pulse Generator Implant Date: 20150521
Lead Channel Impedance Value: 437 Ohm
Lead Channel Pacing Threshold Amplitude: 0.75 V
Lead Channel Sensing Intrinsic Amplitude: 2.5 mV
MDC IDC LEAD IMPLANT DT: 20091013
MDC IDC LEAD LOCATION: 753859
MDC IDC MSMT LEADCHNL RA IMPEDANCE VALUE: 342 Ohm
MDC IDC MSMT LEADCHNL RA PACING THRESHOLD PULSEWIDTH: 0.4 ms
MDC IDC MSMT LEADCHNL RA SENSING INTR AMPL: 2.5 mV
MDC IDC MSMT LEADCHNL RV IMPEDANCE VALUE: 342 Ohm
MDC IDC MSMT LEADCHNL RV PACING THRESHOLD AMPLITUDE: 0.875 V
MDC IDC MSMT LEADCHNL RV PACING THRESHOLD PULSEWIDTH: 0.4 ms
MDC IDC MSMT LEADCHNL RV SENSING INTR AMPL: 18.875 mV
MDC IDC MSMT LEADCHNL RV SENSING INTR AMPL: 18.875 mV
MDC IDC SET LEADCHNL RA PACING AMPLITUDE: 1.5 V
MDC IDC SET LEADCHNL RV PACING AMPLITUDE: 2 V
MDC IDC SET LEADCHNL RV PACING PULSEWIDTH: 0.4 ms
MDC IDC SET LEADCHNL RV SENSING SENSITIVITY: 0.3 mV
MDC IDC STAT BRADY AP VS PERCENT: 60.04 %
MDC IDC STAT BRADY AS VP PERCENT: 0 %
MDC IDC STAT BRADY AS VS PERCENT: 0.13 %
MDC IDC STAT BRADY RA PERCENT PACED: 99.86 %
MDC IDC STAT BRADY RV PERCENT PACED: 39.83 %

## 2017-09-25 NOTE — Telephone Encounter (Signed)
Recommended Dr. Fletcher Anon. Pt understands office will call her to make an appointment. Also informed pt that her remote transmission was received yesterday. She thanks me for helping and understands someone will call her.

## 2017-09-25 NOTE — Telephone Encounter (Signed)
Melissa - Can you arrange pt to establish with Dr. Fletcher Anon for vascular follow up.   She has a hx of bilateral carotid stenosis w/ stenting. Thanks

## 2017-09-25 NOTE — Progress Notes (Signed)
Remote ICD transmission.   

## 2017-10-07 ENCOUNTER — Encounter: Payer: Self-pay | Admitting: Cardiovascular Disease

## 2017-10-07 ENCOUNTER — Ambulatory Visit: Payer: Medicare Other | Admitting: Cardiovascular Disease

## 2017-10-07 VITALS — BP 106/60 | HR 73 | Ht 65.0 in | Wt 197.2 lb

## 2017-10-07 DIAGNOSIS — E785 Hyperlipidemia, unspecified: Secondary | ICD-10-CM | POA: Diagnosis not present

## 2017-10-07 DIAGNOSIS — I739 Peripheral vascular disease, unspecified: Principal | ICD-10-CM

## 2017-10-07 DIAGNOSIS — I779 Disorder of arteries and arterioles, unspecified: Secondary | ICD-10-CM

## 2017-10-07 DIAGNOSIS — I2510 Atherosclerotic heart disease of native coronary artery without angina pectoris: Secondary | ICD-10-CM

## 2017-10-07 DIAGNOSIS — I5022 Chronic systolic (congestive) heart failure: Secondary | ICD-10-CM

## 2017-10-07 NOTE — Patient Instructions (Signed)
Medication Instructions: Your physician recommends that you continue on your current medications as directed. Please refer to the Current Medication list given to you today.  If you need a refill on your cardiac medications before your next appointment, please call your pharmacy.    Procedures/Testing: Your physician has requested that you have a carotid duplex. This test is an ultrasound of the carotid arteries in your neck. It looks at blood flow through these arteries that supply the brain with blood. Allow one hour for this exam. There are no restrictions or special instructions. This will take place at [place], [address]  Follow-Up: Your physician wants you to follow-up in: 12 months with Dr. Fletcher Anon. You will receive a reminder letter in the mail two months in advance. If you don't receive a letter, please call our office at [phone] to schedule this follow-up appointment.    Thank you for choosing [facility] at [facility]!!

## 2017-10-07 NOTE — Progress Notes (Signed)
Cardiology Office Note   Date:  10/07/2017   ID:  Jenna West, DOB 12-05-1945, MRN [id number]  PCP:  Ronita Hipps, MD  Cardiologist:  Dr. Bettina Gavia  Chief Complaint  Patient presents with  . Follow-up    pt have stents in neck      History of Present Illness: Jenna West is a 72 y.o. female who presents to establish vascular care for carotid disease.  She is followed closely by Dr. Bettina Gavia for extensive cardiac history including coronary artery disease and chronic systolic heart failure due to ischemic cardiomyopathy.  She has an ICD in place followed by Dr. Curt Bears.  She has other chronic medical problems that include hyperlipidemia and diabetes mellitus.  She is a lifelong non-smoker. She is known to have carotid artery disease status post right carotid stenting in October 2016 by Dr. Maryjean M[place]. Most recent carotid Doppler in 2016 showed patent right ICA stent with 40-59% stenosis of left ICA.  She denies any chest pain, shortness of breath or palpitations.  No leg claudication.  No symptoms suggestive of CVA.     Past Medical History:  Diagnosis Date  . Acquired hypothyroidism 05/01/2015  . Angina pectoris ([place]) 07/13/2015  . Aspirin long-term use 03/07/2016  . Benign essential hypertension 05/01/2015  . Bilateral carotid artery stenosis 03/07/2016  . 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 Carotid artery stenosis 06/21/2015  . Coronary arteriosclerosis in native artery 05/01/2015   Overview:  Overview:  PCI and stent to LAD and RCA 2004 for MI DES to LAD for subtotal occlusion with no reflow 8/09  . Coronary artery disease of native artery of native heart with stable angina pectoris ([place]) 05/01/2015   Overview:  PCI and stent to LAD and RCA 2004 for MI DES to LAD for subtotal occlusion with no reflow 2009  . ICD (implantable cardioverter-defibrillator) in place 05/01/2015   Overview:  Medtronic  . Left heart failure ([place])  05/01/2015  . MI (myocardial infarction) ([place]) 05/01/2015  . On amiodarone therapy 08/15/2016  . Pure hypercholesterolemia 05/01/2015  . Stage 3 chronic kidney disease ([place]) 12/18/2016  . Syncope and collapse 05/01/2015  . Type 2 diabetes mellitus with stage 3 chronic kidney disease, with long-term current use of insulin ([place]) 07/17/2016  . VT (ventricular tachycardia) ([place]) 05/01/2015    Past Surgical History:  Procedure Laterality Date  . ABDOMINAL HYSTERECTOMY    . ANKLE FRACTURE SURGERY Bilateral    6 months apart, screws  . CARDIAC CATHETERIZATION     2003, 2009, 2016  . CAROTID STENT  06/21/2015   thoracic ao, 4 vessel cerebral arteriogram & rt internal carotid artery pta/stent CPT 70623  . CATARACT EXTRACTION Right    With lens replaced  . COLONOSCOPY  2013  . CORONARY STENT PLACEMENT    . PACEMAKER PLACEMENT     has two, one working and one not, could not remove old one, Dr. Elonda Husky      Current Outpatient Medications  Medication Sig Dispense Refill  . amiodarone (PACERONE) 200 MG tablet Take 0.5 tablets (100 mg total) as directed by mouth. Take on Mon, Wed, and Fri 18 tablet 3  . aspirin EC 81 MG tablet Take 81 mg by mouth daily.    Marland Kitchen atorvastatin (LIPITOR) 80 MG tablet Take 80 mg by mouth daily.    . carvedilol (COREG) 12.5 MG tablet Take 6.25 mg by mouth 2 (two)  times daily.    . clopidogrel (PLAVIX) 75 MG tablet Take 75 mg by mouth daily.    . furosemide (LASIX) 20 MG tablet Take 1 tablet (20 mg total) by mouth daily. 90 tablet 3  . HUMALOG 100 UNIT/ML injection Inject 20 units in the am, 20 units at lunch, and 25 units at bedtime    . LANTUS SOLOSTAR 100 UNIT/ML Solostar Pen Inject 10 units at bedtime    . levothyroxine (SYNTHROID, LEVOTHROID) 75 MCG tablet     . Multiple Vitamin (MULTI-VITAMINS) TABS Take 1 tablet by mouth daily.    . pantoprazole (PROTONIX) 40 MG tablet Take 1 tablet (40 mg total) by mouth daily. 30 tablet 11  . ranolazine (RANEXA) 1000 MG SR tablet Take  1,000 mg by mouth 2 (two) times daily.    . sacubitril-valsartan (ENTRESTO) 49-51 MG Take 0.5 tablets by mouth. At midday    . sertraline (ZOLOFT) 25 MG tablet Take 25 mg by mouth daily.    Marland Kitchen spironolactone (ALDACTONE) 25 MG tablet Take 12.5 mg by mouth daily.    Marland Kitchen zolpidem (AMBIEN CR) 12.5 MG CR tablet Take 12.5 mg by mouth at bedtime.     No current facility-administered medications for this visit.     Allergies:   Shellfish allergy and Sulfa antibiotics    Social History:  The patient  reports that  has never smoked. she has never used smokeless tobacco. She reports that she does not drink alcohol or use drugs.   Family History:  The patient's family history includes Brain cancer in her mother; Breast cancer in her sister; Diabetes in her sister; Heart attack in her father; Heart disease in her father; Rectal cancer in her daughter.    ROS:  Please see the history of present illness.   Otherwise, review of systems are positive for none.   All other systems are reviewed and negative.    PHYSICAL EXAM: VS:  BP 106/60   Pulse 73   Ht 5\' 5"  (1.651 m)   Wt 197 lb 3.2 oz (89.4 kg)   SpO2 98%   BMI 32.82 kg/m  , BMI Body mass index is 32.82 kg/m. GEN: Well nourished, well developed, in no acute distress  HEENT: normal  Neck: no JVD, carotid bruits, or masses Cardiac: RRR; no murmurs, rubs, or gallops,no edema  Respiratory:  clear to auscultation bilaterally, normal work of breathing GI: soft, nontender, nondistended, + BS MS: no deformity or atrophy  Skin: warm and dry, no rash Neuro:  Strength and sensation are intact Psych: euthymic mood, full affect   EKG:  EKG is not ordered today.    Recent Labs: 05/29/2017: ALT 19; TSH 6.770 08/19/2017: BNP 348.0; BUN 37; Creatinine, Ser 2.08; Potassium 4.6; Sodium 140    Lipid Panel No results found for: CHOL, TRIG, HDL, CHOLHDL, VLDL, LDLCALC, LDLDIRECT    Wt Readings from Last 3 Encounters:  10/07/17 197 lb 3.2 oz (89.4 kg)    08/19/17 195 lb (88.5 kg)  07/10/17 198 lb (89.8 kg)        No flowsheet data found.    ASSESSMENT AND PLAN:  1.  Bilateral carotid disease status post right carotid stenting: The patient is on dual antiplatelet therapy given previous carotid stent and multiple coronary stents.  I requested a follow-up carotid Doppler.  Continue treatment of risk factors.  2.  Coronary artery disease involving native coronary arteries without angina: Followed closely by Dr. Bettina Gavia.  3.  Chronic systolic heart failure:  She appears to be euvolemic and currently on optimal medical therapy.  4.  Hyperlipidemia: Continue treatment with high-dose atorvastatin with a target LDL of less than 70.   Disposition:   FU with me in 1 year  Signed,  Kathlyn Sacramento, MD  10/07/2017 10:14 AM    [place]

## 2017-10-23 ENCOUNTER — Ambulatory Visit (HOSPITAL_COMMUNITY)
Admission: RE | Admit: 2017-10-23 | Payer: Medicare Other | Source: Ambulatory Visit | Attending: Cardiovascular Disease | Admitting: Cardiovascular Disease

## 2017-10-31 DIAGNOSIS — R300 Dysuria: Secondary | ICD-10-CM | POA: Diagnosis not present

## 2017-11-13 NOTE — Progress Notes (Signed)
Cardiology Office Note:    Date:  11/14/2017   ID:  Jenna West, DOB 08-08-1946, MRN [id number]  PCP:  Ronita Hipps, MD  Cardiologist:  Shirlee More, MD    Referring MD: Ronita Hipps, MD    ASSESSMENT:    1. Chronic systolic heart failure ([place])   2. Long term current use of amiodarone   3. Hypertensive heart disease with chronic systolic congestive heart failure ([place])   4. Coronary artery disease of native artery of native heart with stable angina pectoris ([place])   5. Coronary arteriosclerosis in native artery    PLAN:    In order of problems listed above:  1. She has done very well with guideline directed medical therapy and has plateaued with [place] [employer] symptoms being short of breath walking a long distance her weights down 7 pounds no exercise intolerance orthopnea PND chest pain palpitations syncope or ICD discharge. 2. Stable continue amiodarone check chest x-ray for toxicity along with liver function and TSH 3. Stable continue medical treatment or noninvasive studies did not indicate benefit from further revascularization 4. Hypertension is stable continue current medical treatment   Next appointment: 3 months   Medication Adjustments/Labs and Tests Ordered: Current medicines are reviewed at length with the patient today.  Concerns regarding medicines are outlined above.  Orders Placed This Encounter  Procedures  . DG Chest 2 View  . Comprehensive Metabolic Panel (CMET)  . Pro b natriuretic peptide (BNP)  . Lipid Profile  . TSH   No orders of the defined types were placed in this encounter.   Chief Complaint  Patient presents with  . Follow-up    3 month flup appt   . Congestive Heart Failure  . Coronary Artery Disease  . Hypertension    History of Present Illness:    Jenna West is a 72 y.o. female with a hx of CAD with PCI of LAD and RCA 2004 and LAD 2009with severe LV dysfunction EF <20% , CHF, Hypertension, VT on  amiodarone and she has an ICD transitioned to [facility] for EP care, and stage 4 CKD last seen 08/19/17.. Compliance with diet, lifestyle and medications: Yes She is pleased with the quality of her life continues to improve and now short of breath only when walking several 100 feet. Past Medical History:  Diagnosis Date  . Acquired hypothyroidism 05/01/2015  . Angina pectoris ([place]) 07/13/2015  . Aspirin long-term use 03/07/2016  . Benign essential hypertension 05/01/2015  . Bilateral carotid artery stenosis 03/07/2016  . 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 Carotid artery stenosis 06/21/2015  . Coronary arteriosclerosis in native artery 05/01/2015   Overview:  Overview:  PCI and stent to LAD and RCA 2004 for MI DES to LAD for subtotal occlusion with no reflow 8/09  . Coronary artery disease of native artery of native heart with stable angina pectoris ([place]) 05/01/2015   Overview:  PCI and stent to LAD and RCA 2004 for MI DES to LAD for subtotal occlusion with no reflow 2009  . ICD (implantable cardioverter-defibrillator) in place 05/01/2015   Overview:  Medtronic  . Left heart failure ([place]) 05/01/2015  . MI (myocardial infarction) ([place]) 05/01/2015  . On amiodarone therapy 08/15/2016  . Pure hypercholesterolemia 05/01/2015  . Stage 3 chronic kidney disease ([place]) 12/18/2016  . Syncope and collapse 05/01/2015  . Type 2 diabetes mellitus with stage 3 chronic kidney disease, with long-term  current use of insulin ([place]) 07/17/2016  . VT (ventricular tachycardia) ([place]) 05/01/2015    Past Surgical History:  Procedure Laterality Date  . ABDOMINAL HYSTERECTOMY    . ANKLE FRACTURE SURGERY Bilateral    6 months apart, screws  . CARDIAC CATHETERIZATION     2003, 2009, 2016  . CAROTID STENT  06/21/2015   thoracic ao, 4 vessel cerebral arteriogram & rt internal carotid artery pta/stent CPT 82993  . CATARACT EXTRACTION Right    With lens replaced  . COLONOSCOPY  2013  .  CORONARY STENT PLACEMENT    . PACEMAKER PLACEMENT     has two, one working and one not, could not remove old one, Dr. Elonda Husky     Current Medications: Current Meds  Medication Sig  . amiodarone (PACERONE) 200 MG tablet Take 0.5 tablets (100 mg total) as directed by mouth. Take on Mon, Wed, and Fri  . aspirin EC 81 MG tablet Take 81 mg by mouth daily.  Marland Kitchen atorvastatin (LIPITOR) 80 MG tablet Take 80 mg by mouth daily.  . carvedilol (COREG) 12.5 MG tablet Take 6.25 mg by mouth 2 (two) times daily.  . clopidogrel (PLAVIX) 75 MG tablet Take 75 mg by mouth daily.  . furosemide (LASIX) 20 MG tablet Take 1 tablet (20 mg total) by mouth daily.  Marland Kitchen HUMALOG 100 UNIT/ML injection Inject 20 units in the am, 20 units at lunch, and 25 units at bedtime  . LANTUS SOLOSTAR 100 UNIT/ML Solostar Pen Inject 10 units at bedtime  . levothyroxine (SYNTHROID, LEVOTHROID) 75 MCG tablet Take 75 mcg by mouth daily before breakfast.   . Multiple Vitamin (MULTI-VITAMINS) TABS Take 1 tablet by mouth daily.  . pantoprazole (PROTONIX) 40 MG tablet Take 1 tablet (40 mg total) by mouth daily.  . ranolazine (RANEXA) 1000 MG SR tablet Take 1,000 mg by mouth 2 (two) times daily.  . sacubitril-valsartan (ENTRESTO) 49-51 MG Take 0.5 tablets by mouth. At midday  . sertraline (ZOLOFT) 25 MG tablet Take 25 mg by mouth daily.  Marland Kitchen spironolactone (ALDACTONE) 25 MG tablet Take 12.5 mg by mouth daily.  Marland Kitchen zolpidem (AMBIEN CR) 12.5 MG CR tablet Take 12.5 mg by mouth at bedtime.     Allergies:   Shellfish allergy and Sulfa antibiotics   Social History   Socioeconomic History  . Marital status: Married    Spouse name: None  . Number of children: None  . Years of education: None  . Highest education level: None  Social Needs  . Financial resource strain: None  . Food insecurity - worry: None  . Food insecurity - inability: None  . Transportation needs - medical: None  . Transportation needs - non-medical: None  Occupational History   . None  Tobacco Use  . Smoking status: Never Smoker  . Smokeless tobacco: Never Used  Substance and Sexual Activity  . Alcohol use: No  . Drug use: No  . Sexual activity: None  Other Topics Concern  . None  Social History Narrative  . None     Family History: The patient's family history includes Brain cancer in her mother; Breast cancer in her sister; Diabetes in her sister; Heart attack in her father; Heart disease in her father; Rectal cancer in her daughter. ROS:   Please see the history of present illness.    All other systems reviewed and are negative.  EKGs/Labs/Other Studies Reviewed:    The following studies were reviewed today  Recent Labs: 05/29/2017: ALT 19; TSH 6.770  08/19/2017: BNP 348.0; BUN 37; Creatinine, Ser 2.08; Potassium 4.6; Sodium 140  Recent Lipid Panel No results found for: CHOL, TRIG, HDL, CHOLHDL, VLDL, LDLCALC, LDLDIRECT  Physical Exam:    VS:  BP 118/62 (BP Location: Right Arm, Patient Position: Sitting, Cuff Size: Normal)   Pulse 71   Ht 5\' 5"  (1.651 m)   Wt 194 lb 1.9 oz (88.1 kg)   SpO2 97%   BMI 32.30 kg/m     Wt Readings from Last 3 Encounters:  11/14/17 194 lb 1.9 oz (88.1 kg)  10/07/17 197 lb 3.2 oz (89.4 kg)  08/19/17 195 lb (88.5 kg)     GEN:  Well nourished, well developed in no acute distress HEENT: Normal NECK: No JVD; No carotid bruits LYMPHATICS: No lymphadenopathy CARDIAC: No S3  RRR, no murmurs, rubs, gallops RESPIRATORY:  Clear to auscultation without rales, wheezing or rhonchi  ABDOMEN: Soft, non-tender, non-distended MUSCULOSKELETAL:  No edema; No deformity  SKIN: Warm and dry NEUROLOGIC:  Alert and oriented x 3 PSYCHIATRIC:  Normal affect    Signed, Shirlee More, MD  11/14/2017 1:00 PM    [place]

## 2017-11-14 ENCOUNTER — Encounter: Payer: Self-pay | Admitting: Cardiology

## 2017-11-14 ENCOUNTER — Ambulatory Visit (HOSPITAL_COMMUNITY)
Admission: RE | Admit: 2017-11-14 | Discharge: 2017-11-14 | Disposition: A | Discharge status: Home / Self Care | Payer: Medicare Other | Source: Ambulatory Visit | Attending: Cardiology | Admitting: Cardiology

## 2017-11-14 ENCOUNTER — Ambulatory Visit (INDEPENDENT_AMBULATORY_CARE_PROVIDER_SITE_OTHER): Payer: Medicare Other | Admitting: Cardiology

## 2017-11-14 ENCOUNTER — Ambulatory Visit (HOSPITAL_BASED_OUTPATIENT_CLINIC_OR_DEPARTMENT_OTHER)
Admission: RE | Admit: 2017-11-14 | Discharge: 2017-11-14 | Disposition: A | Discharge status: Home / Self Care | Payer: Medicare Other | Source: Ambulatory Visit | Attending: Cardiology | Admitting: Cardiology

## 2017-11-14 VITALS — BP 118/62 | HR 71 | Ht 65.0 in | Wt 194.1 lb

## 2017-11-14 DIAGNOSIS — I25118 Atherosclerotic heart disease of native coronary artery with other forms of angina pectoris: Secondary | ICD-10-CM

## 2017-11-14 DIAGNOSIS — Z79899 Other long term (current) drug therapy: Secondary | ICD-10-CM | POA: Insufficient documentation

## 2017-11-14 DIAGNOSIS — I739 Peripheral vascular disease, unspecified: Secondary | ICD-10-CM

## 2017-11-14 DIAGNOSIS — I517 Cardiomegaly: Secondary | ICD-10-CM | POA: Insufficient documentation

## 2017-11-14 DIAGNOSIS — I2510 Atherosclerotic heart disease of native coronary artery without angina pectoris: Secondary | ICD-10-CM | POA: Diagnosis not present

## 2017-11-14 DIAGNOSIS — I779 Disorder of arteries and arterioles, unspecified: Secondary | ICD-10-CM

## 2017-11-14 DIAGNOSIS — I5022 Chronic systolic (congestive) heart failure: Secondary | ICD-10-CM | POA: Diagnosis not present

## 2017-11-14 DIAGNOSIS — I110 Hypertensive heart disease with heart failure: Secondary | ICD-10-CM | POA: Diagnosis not present

## 2017-11-14 DIAGNOSIS — Z5181 Encounter for therapeutic drug level monitoring: Secondary | ICD-10-CM | POA: Insufficient documentation

## 2017-11-14 NOTE — Patient Instructions (Addendum)
Medication Instructions:  Your physician recommends that you continue on your current medications as directed. Please refer to the Current Medication list given to you today.  Labwork: Your physician recommends that you return for lab work in: today. CMP, BNP, TSH, lipid  Testing/Procedures: A chest x-ray takes a picture of the organs and structures inside the chest, including the heart, lungs, and blood vessels. This test can show several things, including, whether the heart is enlarges; whether fluid is building up in the lungs; and whether pacemaker / defibrillator leads are still in place.  Follow-Up: Your physician wants you to follow-up in: 3 months. You will receive a reminder letter in the mail two months in advance. If you don't receive a letter, please call our office to schedule the follow-up appointment.  Any Other Special Instructions Will Be Listed Below (If Applicable).     If you need a refill on your cardiac medications before your next appointment, please call your pharmacy.    Heart Failure  Weigh yourself every morning when you first wake up and record on a calender or note pad, bring this to your office visits. Using a pill tender can help with taking your medications consistently.  Limit your fluid intake to 2 liters daily  Limit your sodium intake to less than 2-3 grams daily. Ask if you need dietary teaching.  If you gain more than 3 pounds (from your dry weight ), double your dose of diuretic for the day.  If you gain more than 5 pounds (from your dry weight), double your dose of lasix and call your heart failure doctor.  Please do not smoke tobacco since it is very bad for your heart.  Please do not drink alcohol since it can worsen your heart failure.Also avoid OTC nonsteroidal drugs, such as advil, aleve and motrin.  Try to exercise for at least 30 minutes every day because this will help your heart be more efficient. You may be eligible for supervised  cardiac rehab, ask your physician.

## 2017-11-15 LAB — TSH: TSH: 3.78 u[IU]/mL (ref 0.450–4.500)

## 2017-11-15 LAB — COMPREHENSIVE METABOLIC PANEL
A/G RATIO: 1.6 (ref 1.2–2.2)
ALT: 16 IU/L (ref 0–32)
AST: 17 IU/L (ref 0–40)
Albumin: 4.4 g/dL (ref 3.5–4.8)
Alkaline Phosphatase: 88 IU/L (ref 39–117)
BUN/Creatinine Ratio: 17 (ref 12–28)
BUN: 30 mg/dL — AB (ref 8–27)
Bilirubin Total: 0.5 mg/dL (ref 0.0–1.2)
CALCIUM: 8.5 mg/dL — AB (ref 8.7–10.3)
CO2: 20 mmol/L (ref 20–29)
CREATININE: 1.75 mg/dL — AB (ref 0.57–1.00)
Chloride: 102 mmol/L (ref 96–106)
GFR, EST AFRICAN AMERICAN: 33 mL/min/{1.73_m2} — AB (ref >59)
GFR, EST NON AFRICAN AMERICAN: 29 mL/min/{1.73_m2} — AB (ref >59)
GLUCOSE: 381 mg/dL — AB (ref 65–99)
Globulin, Total: 2.7 g/dL (ref 1.5–4.5)
POTASSIUM: 4.5 mmol/L (ref 3.5–5.2)
Sodium: 140 mmol/L (ref 134–144)
TOTAL PROTEIN: 7.1 g/dL (ref 6.0–8.5)

## 2017-11-15 LAB — LIPID PANEL
CHOL/HDL RATIO: 3.2 ratio (ref 0.0–4.4)
Cholesterol, Total: 126 mg/dL (ref 100–199)
HDL: 40 mg/dL (ref >39)
LDL CALC: 64 mg/dL (ref 0–99)
TRIGLYCERIDES: 110 mg/dL (ref 0–149)
VLDL Cholesterol Cal: 22 mg/dL (ref 5–40)

## 2017-11-15 LAB — PRO B NATRIURETIC PEPTIDE: NT-Pro BNP: 2487 pg/mL — ABNORMAL HIGH (ref 0–301)

## 2017-11-17 ENCOUNTER — Ambulatory Visit: Payer: Medicare Other | Admitting: Cardiology

## 2017-11-19 ENCOUNTER — Telehealth: Payer: Self-pay | Admitting: *Deleted

## 2017-11-19 ENCOUNTER — Encounter (HOSPITAL_COMMUNITY): Payer: Medicare Other

## 2017-11-19 DIAGNOSIS — I6529 Occlusion and stenosis of unspecified carotid artery: Secondary | ICD-10-CM

## 2017-11-19 NOTE — Telephone Encounter (Signed)
Patient made aware of results and verbalized her understanding. Repeat carotid ultrasound has been ordered.

## 2017-11-19 NOTE — Telephone Encounter (Signed)
-----   Message from Wellington Hampshire, MD sent at 11/19/2017 10:13 AM EST ----- Doppler showed patent right carotid stent.  Repeat study in 1 year.

## 2017-12-24 ENCOUNTER — Telehealth: Payer: Self-pay | Admitting: Cardiology

## 2017-12-24 ENCOUNTER — Ambulatory Visit (INDEPENDENT_AMBULATORY_CARE_PROVIDER_SITE_OTHER): Payer: Medicare Other | Admitting: *Deleted

## 2017-12-24 DIAGNOSIS — I472 Ventricular tachycardia, unspecified: Secondary | ICD-10-CM

## 2017-12-24 NOTE — Telephone Encounter (Signed)
LMOVM reminding pt to send remote transmission.   

## 2017-12-25 ENCOUNTER — Encounter: Payer: Self-pay | Admitting: Cardiology

## 2017-12-25 NOTE — Progress Notes (Signed)
Remote ICD transmission.   

## 2018-01-05 DIAGNOSIS — J40 Bronchitis, not specified as acute or chronic: Secondary | ICD-10-CM | POA: Diagnosis not present

## 2018-01-05 DIAGNOSIS — J329 Chronic sinusitis, unspecified: Secondary | ICD-10-CM | POA: Diagnosis not present

## 2018-01-30 LAB — CUP PACEART REMOTE DEVICE CHECK
Battery Remaining Longevity: 48 mo
Battery Voltage: 2.97 V
Brady Statistic AP VS Percent: 77.02 %
Brady Statistic RA Percent Paced: 98.46 %
Brady Statistic RV Percent Paced: 21.65 %
Date Time Interrogation Session: 20190411002027
HIGH POWER IMPEDANCE MEASURED VALUE: 53 Ohm
HighPow Impedance: 42 Ohm
Implantable Lead Implant Date: 20091013
Implantable Lead Model: 4076
Implantable Pulse Generator Implant Date: 20150521
Lead Channel Impedance Value: 342 Ohm
Lead Channel Impedance Value: 380 Ohm
Lead Channel Impedance Value: 399 Ohm
Lead Channel Pacing Threshold Pulse Width: 0.4 ms
Lead Channel Pacing Threshold Pulse Width: 0.4 ms
Lead Channel Sensing Intrinsic Amplitude: 19.5 mV
Lead Channel Setting Pacing Amplitude: 1.5 V
Lead Channel Setting Pacing Pulse Width: 0.4 ms
Lead Channel Setting Sensing Sensitivity: 0.3 mV
MDC IDC LEAD IMPLANT DT: 20091013
MDC IDC LEAD LOCATION: 753859
MDC IDC LEAD LOCATION: 753860
MDC IDC MSMT LEADCHNL RA PACING THRESHOLD AMPLITUDE: 0.625 V
MDC IDC MSMT LEADCHNL RA SENSING INTR AMPL: 3 mV
MDC IDC MSMT LEADCHNL RA SENSING INTR AMPL: 3 mV
MDC IDC MSMT LEADCHNL RV PACING THRESHOLD AMPLITUDE: 1 V
MDC IDC MSMT LEADCHNL RV SENSING INTR AMPL: 19.5 mV
MDC IDC SET LEADCHNL RV PACING AMPLITUDE: 2 V
MDC IDC STAT BRADY AP VP PERCENT: 21.51 %
MDC IDC STAT BRADY AS VP PERCENT: 0.16 %
MDC IDC STAT BRADY AS VS PERCENT: 1.31 %

## 2018-02-10 NOTE — Progress Notes (Signed)
Cardiology Office Note:    Date:  02/11/2018   ID:  Jenna West, DOB 1946-01-06, MRN [id number]  PCP:  Ronita Hipps, MD  Cardiologist:  Shirlee More, MD    Referring MD: Ronita Hipps, MD    ASSESSMENT:    1. Chronic systolic heart failure ([place])   2. Coronary artery disease involving native coronary artery of native heart with angina pectoris ([place])   3. 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 VT (ventricular tachycardia) ([place])   5. ICD (implantable cardioverter-defibrillator) in place   6. On amiodarone therapy   7. Pure hypercholesterolemia    PLAN:    In order of problems listed above:  1. Stable continue current guideline directed medical therapy with loop diuretic MRA beta-blocker and ARNI.  She is stable and New York Heart Association class II 2. Stable having no anginal discomfort continue medical therapy previous noninvasive evaluation has not shown a target or benefit from further revascularization 3. Stable recheck renal function today 4. Stable continue low-dose amiodarone without any findings of toxicity 5. Stable continue management device clinic 6. Continue low-dose amiodarone 7. Continue her statin   Next appointment: 3 months   Medication Adjustments/Labs and Tests Ordered: Current medicines are reviewed at length with the patient today.  Concerns regarding medicines are outlined above.  No orders of the defined types were placed in this encounter.  No orders of the defined types were placed in this encounter.   Chief Complaint  Patient presents with  . Follow-up  . Congestive Heart Failure    History of Present Illness:    11/14/17   Jenna West is a 72 y.o. female with a hx of CAD with PCI of LAD and RCA 2004 and LAD 2009with severe LV dysfunction EF <20% , CHF, Hypertension, VT on amiodarone andshehas an ICD[facility],  and stage 4 CKD  last seen 11/14/17.  ASSESSMENT:    1. Chronic systolic heart failure ([place])   2. Long term current use of amiodarone   3. Hypertensive heart disease with chronic systolic congestive heart failure ([place])   4. Coronary artery disease of native artery of native heart with stable angina pectoris ([place])   5. Coronary arteriosclerosis in native artery    PLAN:    In order of problems listed above:  1.    She has done very well with guideline directed medical therapy and has plateaued with New York Heart Association Class 2 symptoms being short of breath walking a long distance her weights down 7 pounds no exercise intolerance orthopnea PND chest pain palpitations syncope or ICD discharge. 8. Stable continue amiodarone check chest x-ray for toxicity along with liver function and TSH 9. Stable continue medical treatment or noninvasive studies did not indicate benefit from further revascularization 10. Hypertension is stable continue current medical treatment  Compliance with diet, lifestyle and medications: Yes  Her weight is stable at home no edema orthopnea PND chest pain palpitation or syncope but remains short of breath when she does more vigorous activities and upper extremities or walks longer distances to go shopping. Past Medical History:  Diagnosis Date  . Acquired hypothyroidism 05/01/2015  . Angina pectoris ([place]) 07/13/2015  . Aspirin long-term use 03/07/2016  . Benign essential hypertension 05/01/2015  . Bilateral carotid artery stenosis 03/07/2016  . 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 Carotid artery stenosis 06/21/2015  . Coronary arteriosclerosis in native artery 05/01/2015   Overview:  Overview:  PCI and stent to LAD and RCA 2004 for MI DES to LAD for subtotal occlusion with no reflow 8/09  . Coronary artery disease of native artery of native heart with stable angina pectoris ([place]) 05/01/2015   Overview:  PCI and stent  to LAD and RCA 2004 for MI DES to LAD for subtotal occlusion with no reflow 2009  . ICD (implantable cardioverter-defibrillator) in place 05/01/2015   Overview:  Medtronic  . Left heart failure ([place]) 05/01/2015  . MI (myocardial infarction) ([place]) 05/01/2015  . On amiodarone therapy 08/15/2016  . Pure hypercholesterolemia 05/01/2015  . Stage 3 chronic kidney disease ([place]) 12/18/2016  . Syncope and collapse 05/01/2015  . Type 2 diabetes mellitus with stage 3 chronic kidney disease, with long-term current use of insulin ([place]) 07/17/2016  . VT (ventricular tachycardia) ([place]) 05/01/2015    Past Surgical History:  Procedure Laterality Date  . ABDOMINAL HYSTERECTOMY    . ANKLE FRACTURE SURGERY Bilateral    6 months apart, screws  . CARDIAC CATHETERIZATION     2003, 2009, 2016  . CAROTID STENT  06/21/2015   thoracic ao, 4 vessel cerebral arteriogram & rt internal carotid artery pta/stent CPT 97989  . CATARACT EXTRACTION Right    With lens replaced  . COLONOSCOPY  2013  . CORONARY STENT PLACEMENT    . PACEMAKER PLACEMENT     has two, one working and one not, could not remove old one, Dr. Elonda Husky     Current Medications: Current Meds  Medication Sig  . amiodarone (PACERONE) 200 MG tablet Take 0.5 tablets (100 mg total) as directed by mouth. Take on Mon, Wed, and Fri  . aspirin EC 81 MG tablet Take 81 mg by mouth daily.  Marland Kitchen atorvastatin (LIPITOR) 80 MG tablet Take 80 mg by mouth daily.  . carvedilol (COREG) 12.5 MG tablet Take 6.25 mg by mouth 2 (two) times daily.  . clopidogrel (PLAVIX) 75 MG tablet Take 75 mg by mouth daily.  . furosemide (LASIX) 20 MG tablet Take 1 tablet (20 mg total) by mouth daily.  Marland Kitchen HUMALOG 100 UNIT/ML injection Inject 20 units in the am, 20 units at lunch, and 25 units at bedtime  . LANTUS SOLOSTAR 100 UNIT/ML Solostar Pen Inject 10 units at bedtime  . levothyroxine (SYNTHROID, LEVOTHROID) 75 MCG tablet Take 75 mcg by mouth daily before breakfast.   . Multiple Vitamin  (MULTI-VITAMINS) TABS Take 1 tablet by mouth daily.  . pantoprazole (PROTONIX) 40 MG tablet Take 1 tablet (40 mg total) by mouth daily.  . ranolazine (RANEXA) 1000 MG SR tablet Take 1,000 mg by mouth 2 (two) times daily.  . sacubitril-valsartan (ENTRESTO) 49-51 MG Take 0.5 tablets by mouth. At midday  . sertraline (ZOLOFT) 25 MG tablet Take 25 mg by mouth daily.  Marland Kitchen spironolactone (ALDACTONE) 25 MG tablet Take 12.5 mg by mouth daily.  Marland Kitchen zolpidem (AMBIEN CR) 12.5 MG CR tablet Take 12.5 mg by mouth at bedtime.     Allergies:   Shellfish allergy and Sulfa antibiotics   Social History   Socioeconomic History  . Marital status: Married    Spouse name: Not on file  . Number of children: Not on file  . Years of education: Not on file  . Highest education level: Not on file  Occupational History  . Not on file  Social Needs  . Emergency planning/management officer  strain: Not on file  . Food insecurity:    Worry: Not on file    Inability: Not on file  . Transportation needs:    Medical: Not on file    Non-medical: Not on file  Tobacco Use  . Smoking status: Never Smoker  . Smokeless tobacco: Never Used  Substance and Sexual Activity  . Alcohol use: No  . Drug use: No  . Sexual activity: Not on file  Lifestyle  . Physical activity:    Days per week: Not on file    Minutes per session: Not on file  . Stress: Not on file  Relationships  . Social connections:    Talks on phone: Not on file    Gets together: Not on file    Attends religious service: Not on file    Active member of club or organization: Not on file    Attends meetings of clubs or organizations: Not on file    Relationship status: Not on file  Other Topics Concern  . Not on file  Social History Narrative  . Not on file     Family History: The patient's family history includes Brain cancer in her mother; Breast cancer in her sister; Diabetes in her sister; Heart attack in her father; Heart disease in her father; Rectal cancer in  her daughter. ROS:   Please see the history of present illness.    All other systems reviewed and are negative.  EKGs/Labs/Other Studies Reviewed:    The following studies were reviewed today:   Recent Labs: 08/19/2017: BNP 348.0 11/14/2017: ALT 16; BUN 30; Creatinine, Ser 1.75; NT-Pro BNP 2,487; Potassium 4.5; Sodium 140; TSH 3.780  Recent Lipid Panel    Component Value Date/Time   CHOL 126 11/14/2017 1015   TRIG 110 11/14/2017 1015   HDL 40 11/14/2017 1015   CHOLHDL 3.2 11/14/2017 1015   LDLCALC 64 11/14/2017 1015    Physical Exam:    VS:  BP 100/62 (BP Location: Left Arm, Patient Position: Sitting, Cuff Size: Normal)   Pulse 74   Ht 5\' 5"  (1.651 m)   Wt 191 lb (86.6 kg)   SpO2 99%   BMI 31.78 kg/m     Wt Readings from Last 3 Encounters:  02/11/18 191 lb (86.6 kg)  11/14/17 194 lb 1.9 oz (88.1 kg)  10/07/17 197 lb 3.2 oz (89.4 kg)     GEN:  Well nourished, well developed in no acute distress HEENT: Normal NECK: No JVD; No carotid bruits LYMPHATICS: No lymphadenopathy CARDIAC: soft S1 RRR, no murmurs, rubs, gallops RESPIRATORY:  Clear to auscultation without rales, wheezing or rhonchi  ABDOMEN: Soft, non-tender, non-distended MUSCULOSKELETAL:  No edema; No deformity  SKIN: Warm and dry NEUROLOGIC:  Alert and oriented x 3 PSYCHIATRIC:  Normal affect    Signed, Shirlee More, MD  02/11/2018 8:26 AM    [place]

## 2018-02-11 ENCOUNTER — Ambulatory Visit (INDEPENDENT_AMBULATORY_CARE_PROVIDER_SITE_OTHER): Payer: Medicare Other | Admitting: Cardiology

## 2018-02-11 ENCOUNTER — Encounter: Payer: Self-pay | Admitting: Cardiology

## 2018-02-11 VITALS — BP 100/62 | HR 74 | Ht 65.0 in | Wt 191.0 lb

## 2018-02-11 DIAGNOSIS — E7800 Pure hypercholesterolemia, unspecified: Secondary | ICD-10-CM | POA: Diagnosis not present

## 2018-02-11 DIAGNOSIS — I5022 Chronic systolic (congestive) heart failure: Secondary | ICD-10-CM

## 2018-02-11 DIAGNOSIS — Z79899 Other long term (current) drug therapy: Secondary | ICD-10-CM

## 2018-02-11 DIAGNOSIS — N184 Chronic kidney disease, stage 4 (severe): Secondary | ICD-10-CM

## 2018-02-11 DIAGNOSIS — I472 Ventricular tachycardia, unspecified: Secondary | ICD-10-CM

## 2018-02-11 DIAGNOSIS — I130 Hypertensive heart and chronic kidney disease with heart failure and stage 1 through stage 4 chronic kidney disease, or unspecified chronic kidney disease: Secondary | ICD-10-CM

## 2018-02-11 DIAGNOSIS — Z95810 Presence of automatic (implantable) cardiac defibrillator: Secondary | ICD-10-CM

## 2018-02-11 DIAGNOSIS — I25119 Atherosclerotic heart disease of native coronary artery with unspecified angina pectoris: Secondary | ICD-10-CM

## 2018-02-11 NOTE — Patient Instructions (Signed)
Medication Instructions:  Your physician recommends that you continue on your current medications as directed. Please refer to the Current Medication list given to you today.   Labwork: Your physician recommends that you have labs drawn today:  BMP and proBNP   Testing/Procedures: NONE   Follow-Up: Your physician recommends that you schedule a follow-up appointment in: 3 months    Any Other Special Instructions Will Be Listed Below (If Applicable).     If you need a refill on your cardiac medications before your next appointment, please call your pharmacy.

## 2018-02-12 LAB — BASIC METABOLIC PANEL
BUN/Creatinine Ratio: 20 (ref 12–28)
BUN: 35 mg/dL — ABNORMAL HIGH (ref 8–27)
CALCIUM: 9.1 mg/dL (ref 8.7–10.3)
CHLORIDE: 102 mmol/L (ref 96–106)
CO2: 20 mmol/L (ref 20–29)
Creatinine, Ser: 1.74 mg/dL — ABNORMAL HIGH (ref 0.57–1.00)
GFR calc non Af Amer: 29 mL/min/{1.73_m2} — ABNORMAL LOW (ref >59)
GFR, EST AFRICAN AMERICAN: 33 mL/min/{1.73_m2} — AB (ref >59)
Glucose: 278 mg/dL — ABNORMAL HIGH (ref 65–99)
POTASSIUM: 4.9 mmol/L (ref 3.5–5.2)
Sodium: 138 mmol/L (ref 134–144)

## 2018-02-12 LAB — PRO B NATRIURETIC PEPTIDE: NT-PRO BNP: 2749 pg/mL — AB (ref 0–301)

## 2018-02-14 ENCOUNTER — Other Ambulatory Visit: Payer: Self-pay | Admitting: Cardiology

## 2018-02-14 DIAGNOSIS — I25118 Atherosclerotic heart disease of native coronary artery with other forms of angina pectoris: Secondary | ICD-10-CM

## 2018-02-16 ENCOUNTER — Other Ambulatory Visit: Payer: Self-pay

## 2018-02-16 DIAGNOSIS — I25118 Atherosclerotic heart disease of native coronary artery with other forms of angina pectoris: Secondary | ICD-10-CM

## 2018-02-16 MED ORDER — PANTOPRAZOLE SODIUM 40 MG PO TBEC: 40.0000 mg | DELAYED_RELEASE_TABLET | Freq: Every day | ORAL | 3 refills | Status: DC

## 2018-02-16 MED ORDER — FUROSEMIDE 20 MG PO TABS: 20.0000 mg | ORAL_TABLET | Freq: Every day | ORAL | 3 refills | Status: DC

## 2018-02-26 DIAGNOSIS — J329 Chronic sinusitis, unspecified: Secondary | ICD-10-CM | POA: Diagnosis not present

## 2018-02-26 DIAGNOSIS — Z9181 History of falling: Secondary | ICD-10-CM | POA: Diagnosis not present

## 2018-02-26 DIAGNOSIS — Z1331 Encounter for screening for depression: Secondary | ICD-10-CM | POA: Diagnosis not present

## 2018-02-26 DIAGNOSIS — Z1339 Encounter for screening examination for other mental health and behavioral disorders: Secondary | ICD-10-CM | POA: Diagnosis not present

## 2018-02-26 DIAGNOSIS — J40 Bronchitis, not specified as acute or chronic: Secondary | ICD-10-CM | POA: Diagnosis not present

## 2018-03-23 ENCOUNTER — Ambulatory Visit (INDEPENDENT_AMBULATORY_CARE_PROVIDER_SITE_OTHER): Payer: Self-pay

## 2018-03-23 ENCOUNTER — Encounter (INDEPENDENT_AMBULATORY_CARE_PROVIDER_SITE_OTHER): Payer: Self-pay | Admitting: Physician Assistant

## 2018-03-23 ENCOUNTER — Ambulatory Visit (INDEPENDENT_AMBULATORY_CARE_PROVIDER_SITE_OTHER): Payer: Medicare Other | Admitting: Physician Assistant

## 2018-03-23 DIAGNOSIS — M7671 Peroneal tendinitis, right leg: Secondary | ICD-10-CM

## 2018-03-23 DIAGNOSIS — M25572 Pain in left ankle and joints of left foot: Secondary | ICD-10-CM

## 2018-03-23 DIAGNOSIS — M25571 Pain in right ankle and joints of right foot: Secondary | ICD-10-CM

## 2018-03-23 NOTE — Progress Notes (Signed)
Office Visit Note   Patient: Jenna West           Date of Birth: [birth year]           MRN: [id number] Visit Date: 03/23/2018              Requested by: Jenna Hipps, MD [address] [place] [id number], PCP: Jenna Hipps, MD   Assessment & Plan: Visit Diagnoses:  1. Pain in right ankle and joints of right foot   2. Pain in left ankle and joints of left foot   3. Peroneal tendinitis, right leg     Plan: We will send her to physical therapy for strengthening the right ankle modalities to the right peroneal tendons and a home exercise program.  In regards to the hardware in the right ankle history this point time I would just watch the hardware for any signs of skin breakdown.  Would recommend removal of the hardware if she has impending ulcers or impending breakdown of the skin.  Discussed with her at length the risk of removing the hardware including wound healing problems ans inability to remove hardware completely.  Mrs.  Follow-Up Instructions: Return if symptoms worsen or fail to improve.   Orders:  Orders Placed This Encounter  Procedures  . XR Ankle Complete Right  . XR Ankle Complete Left  . Ambulatory referral to Physical Therapy   No orders of the defined types were placed in this encounter.     Procedures: No procedures performed   Clinical Data: No additional findings.   Subjective: Chief Complaint  Patient presents with  . Right Ankle - Pain  . Left Ankle - Pain    HPI Jenna West 72 year old female who comes in today with bilateral ankle pain.  She states that her right ankle turns every 8 to 2 weeks or so and then as per day she is unable to put pressure on the ankle.  She has had fractures of both ankles in the past.  She has retained hardware in the right ankle.  She is is a question of the hardware in the right ankle is backing out as she feels the hardware is somewhat prominent.  She said no new injury to either ankle.  She had  surgery on both ankles in the past 1 approximately 12 years ago and the other 15 years ago due to fractures.  Right ankle she had a bimalleolar ankle fracture. Review of Systems   Objective: Vital Signs: There were no vitals taken for this visit.  Physical Exam  Constitutional: She is oriented to person, place, and time. She appears well-developed and well-nourished. No distress.  Pulmonary/Chest: Effort normal.  Neurological: She is alert and oriented to person, place, and time.  Skin: She is not diaphoretic.  Psychiatric: She has a normal mood and affect.    Ortho Exam Bilateral ankle she has full dorsiflexion plantarflexion.  Inversion eversion against resistance reveals 5 out of 5 strength bilaterally however on the right causes pain in the lateral aspect with eversion against resistance.  She is able to do single heel raise bilaterally but has pain and some difficulty on the right performing this.  There is no impending ulcers rashes or skin lesion bilateral feet and ankles.  Dorsal pedal pulses are present bilaterally.  Prominence of hardware is felt distal portion of the lateral medial malleolus the right ankle only. Specialty Comments:  No specialty comments available.  Imaging: Xr Ankle Complete Left  Result Date: 03/23/2018 Left ankle 3 views: Talus well located within the ankle mortise.  Anterior talotibial spur present.  Otherwise no significant arthritic changes.  No acute fracture.  Xr Ankle Complete Right  Result Date: 03/23/2018 Right ankle talus well located within the ankle mortise no diastases.  Status post bimalleolar ankle fracture with hardware present.  There is no hardware failure.  No evidence of backing out of screws.  No acute fracture.    PMFS History: Patient Active Problem List   Diagnosis Date Noted  . Ischemic cardiomyopathy 06/04/2017  . Heartburn 05/29/2017  . Chronic systolic heart failure ([place]) 05/28/2017  . Hypertensive heart and kidney disease  with HF and CKD ([place]) 05/28/2017  . CKD (chronic kidney disease) stage 4, GFR 15-29 ml/min ([facility]) 12/18/2016  . On amiodarone therapy 08/15/2016  . Type 2 diabetes mellitus with stage 3 chronic kidney disease, with long-term current use of insulin ([place]) 07/17/2016  . Aspirin long-term use 03/07/2016  . Bilateral carotid artery stenosis 03/07/2016  . Angina pectoris ([place]) 07/13/2015  . Carotid artery stenosis 06/21/2015  . Acquired hypothyroidism 05/01/2015  . 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 Cardiomyopathy ([place]) 05/01/2015  . Coronary artery disease involving native coronary artery of native heart with angina pectoris ([place]) 05/01/2015  . Coronary artery disease of native artery of native heart with stable angina pectoris ([place]) 05/01/2015  . ICD (implantable cardioverter-defibrillator) in place 05/01/2015  . Left heart failure ([place]) 05/01/2015  . MI (myocardial infarction) ([place]) 05/01/2015  . Pure hypercholesterolemia 05/01/2015  . Syncope and collapse 05/01/2015  . VT (ventricular tachycardia) ([place]) 05/01/2015   Past Medical History:  Diagnosis Date  . Acquired hypothyroidism 05/01/2015  . Angina pectoris ([place]) 07/13/2015  . Aspirin long-term use 03/07/2016  . Benign essential hypertension 05/01/2015  . Bilateral carotid artery stenosis 03/07/2016  . Cardiomyopathy ([place]) 05/01/2015   Overview:  EF 20% with aneurysm of the apex  . Carotid artery stenosis 06/21/2015  . Coronary arteriosclerosis in native artery 05/01/2015   Overview:  Overview:  PCI and stent to LAD and RCA 2004 for MI DES to LAD for subtotal occlusion with no reflow 8/09  . Coronary artery disease of native artery of native heart with stable angina pectoris ([place]) 05/01/2015   Overview:  PCI and stent to LAD and RCA 2004 for MI DES to LAD for subtotal occlusion with no reflow 2009  . ICD (implantable  cardioverter-defibrillator) in place 05/01/2015   Overview:  Medtronic  . Left heart failure ([place]) 05/01/2015  . MI (myocardial infarction) ([place]) 05/01/2015  . On amiodarone therapy 08/15/2016  . Pure hypercholesterolemia 05/01/2015  . Stage 3 chronic kidney disease ([place]) 12/18/2016  . Syncope and collapse 05/01/2015  . Type 2 diabetes mellitus with stage 3 chronic kidney disease, with long-term current use of insulin ([place]) 07/17/2016  . VT (ventricular tachycardia) ([place]) 05/01/2015    Family History  Problem Relation Age of Onset  . Heart attack Father   . Heart disease Father   . Brain cancer Mother   . Diabetes Sister   . Breast cancer Sister   . Rectal cancer Daughter     Past Surgical History:  Procedure Laterality Date  . ABDOMINAL HYSTERECTOMY    . ANKLE FRACTURE SURGERY Bilateral    6 months apart, screws  . CARDIAC CATHETERIZATION     2003, 2009, 2016  . CAROTID STENT  06/21/2015  thoracic ao, 4 vessel cerebral arteriogram & rt internal carotid artery pta/stent CPT 77116  . CATARACT EXTRACTION Right    With lens replaced  . COLONOSCOPY  2013  . CORONARY STENT PLACEMENT    . PACEMAKER PLACEMENT     has two, one working and one not, could not remove old one, Dr. Elonda Husky    Social History   Occupational History  . Not on file  Tobacco Use  . Smoking status: Never Smoker  . Smokeless tobacco: Never Used  Substance and Sexual Activity  . Alcohol use: No  . Drug use: No  . Sexual activity: Not on file

## 2018-03-25 ENCOUNTER — Ambulatory Visit (INDEPENDENT_AMBULATORY_CARE_PROVIDER_SITE_OTHER): Payer: Medicare Other | Admitting: *Deleted

## 2018-03-25 DIAGNOSIS — I255 Ischemic cardiomyopathy: Secondary | ICD-10-CM

## 2018-03-25 NOTE — Progress Notes (Signed)
Remote ICD transmission.   

## 2018-04-04 ENCOUNTER — Other Ambulatory Visit: Payer: Self-pay | Admitting: Cardiology

## 2018-04-23 LAB — CUP PACEART REMOTE DEVICE CHECK
Battery Remaining Longevity: 46 mo
Battery Voltage: 2.97 V
Brady Statistic AP VP Percent: 49.29 %
Brady Statistic AS VS Percent: 0.14 %
Date Time Interrogation Session: 20190710073424
HighPow Impedance: 35 Ohm
HighPow Impedance: 41 Ohm
Implantable Lead Implant Date: 20091013
Implantable Lead Location: 753860
Implantable Lead Model: 6947
Implantable Pulse Generator Implant Date: 20150521
Lead Channel Impedance Value: 380 Ohm
Lead Channel Pacing Threshold Pulse Width: 0.4 ms
Lead Channel Sensing Intrinsic Amplitude: 2.5 mV
Lead Channel Sensing Intrinsic Amplitude: 2.5 mV
MDC IDC LEAD IMPLANT DT: 20091013
MDC IDC LEAD LOCATION: 753859
MDC IDC MSMT LEADCHNL RA IMPEDANCE VALUE: 323 Ohm
MDC IDC MSMT LEADCHNL RA PACING THRESHOLD AMPLITUDE: 0.625 V
MDC IDC MSMT LEADCHNL RV IMPEDANCE VALUE: 304 Ohm
MDC IDC MSMT LEADCHNL RV PACING THRESHOLD AMPLITUDE: 0.875 V
MDC IDC MSMT LEADCHNL RV PACING THRESHOLD PULSEWIDTH: 0.4 ms
MDC IDC MSMT LEADCHNL RV SENSING INTR AMPL: 17.25 mV
MDC IDC MSMT LEADCHNL RV SENSING INTR AMPL: 17.25 mV
MDC IDC SET LEADCHNL RA PACING AMPLITUDE: 1.5 V
MDC IDC SET LEADCHNL RV PACING AMPLITUDE: 2 V
MDC IDC SET LEADCHNL RV PACING PULSEWIDTH: 0.4 ms
MDC IDC SET LEADCHNL RV SENSING SENSITIVITY: 0.3 mV
MDC IDC STAT BRADY AP VS PERCENT: 50.55 %
MDC IDC STAT BRADY AS VP PERCENT: 0.01 %
MDC IDC STAT BRADY RA PERCENT PACED: 99.82 %
MDC IDC STAT BRADY RV PERCENT PACED: 49.28 %

## 2018-05-13 DIAGNOSIS — L814 Other melanin hyperpigmentation: Secondary | ICD-10-CM | POA: Diagnosis not present

## 2018-05-13 DIAGNOSIS — D485 Neoplasm of uncertain behavior of skin: Secondary | ICD-10-CM | POA: Diagnosis not present

## 2018-05-13 DIAGNOSIS — L820 Inflamed seborrheic keratosis: Secondary | ICD-10-CM | POA: Diagnosis not present

## 2018-05-13 DIAGNOSIS — D2239 Melanocytic nevi of other parts of face: Secondary | ICD-10-CM | POA: Diagnosis not present

## 2018-05-13 DIAGNOSIS — L821 Other seborrheic keratosis: Secondary | ICD-10-CM | POA: Diagnosis not present

## 2018-05-26 NOTE — Progress Notes (Signed)
Cardiology Office Note:    Date:  05/27/2018   ID:  Jenna West, DOB 12-02-1945, MRN [id number]  PCP:  Ronita Hipps, MD  Cardiologist:  Shirlee More, MD    Referring MD: Ronita Hipps, MD    ASSESSMENT:    1. 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 Coronary artery disease involving native coronary artery of native heart with angina pectoris ([place])   3. On amiodarone therapy   4. ICD (implantable cardioverter-defibrillator) in place   5. Pure hypercholesterolemia   6. Left heart failure (HCC)    PLAN:    In order of problems listed above:  1. Stable in some fashion she is taken both an ARB as well as Entresto as having orthostatic lightheadedness.  I think this is a strategy in the past when she could not access the [facility] and she will discontinue her ARB.  She will continue other guideline directed therapy with loop diuretic spironolactone and beta-blocker.  She has no fluid overload New York Heart Association class II 2. Stable CAD continue medical treatment 3. Continue low-dose amiodarone check liver function and TSH for toxicity 4. Stable managed in device clinic 5. Stable continue statin check lipid profile liver function   Next appointment: 3 months   Medication Adjustments/Labs and Tests Ordered: Current medicines are reviewed at length with the patient today.  Concerns regarding medicines are outlined above.  Orders Placed This Encounter  Procedures  . Comp Met (CMET)  . Pro b natriuretic peptide  . Lipid Profile  . EKG 12-Lead   No orders of the defined types were placed in this encounter.   Chief Complaint  Patient presents with  . Congestive Heart Failure    History of Present Illness:    Jenna West is a 72 y.o. female with a hx of CAD with PCI of LAD and RCA 2004 and LAD 2009 with severe LV dysfunction EF 20-25% and LV aneurysm, CHF, Hypertension, VT  on amiodarone and has an ICD, and stage 4 CKD last seen 02/11/18. Compliance with diet, lifestyle and medications: Yes  She is under intense stress at home has had some orthostatic lightheadedness no orthopnea edema chest pain palpitation or syncope and is short of breath from more than usual activity New York Heart Association class II Past Medical History:  Diagnosis Date  . Acquired hypothyroidism 05/01/2015  . Angina pectoris ([place]) 07/13/2015  . Aspirin long-term use 03/07/2016  . Benign essential hypertension 05/01/2015  . Bilateral carotid artery stenosis 03/07/2016  . Cardiomyopathy ([place]) 05/01/2015   Overview:  EF 20% with aneurysm of the apex  . Carotid artery stenosis 06/21/2015  . Coronary arteriosclerosis in native artery 05/01/2015   Overview:  Overview:  PCI and stent to LAD and RCA 2004 for MI DES to LAD for subtotal occlusion with no reflow 8/09  . Coronary artery disease of native artery of native heart with stable angina pectoris ([place]) 05/01/2015   Overview:  PCI and stent to LAD and RCA 2004 for MI DES to LAD for subtotal occlusion with no reflow 2009  . ICD (implantable cardioverter-defibrillator) in place 05/01/2015   Overview:  Medtronic  . Left heart failure ([place]) 05/01/2015  . MI (myocardial infarction) ([place]) 05/01/2015  . On amiodarone therapy 08/15/2016  . Pure hypercholesterolemia 05/01/2015  . Stage 3 chronic kidney disease ([place]) 12/18/2016  . Syncope and collapse 05/01/2015  . Type 2  diabetes mellitus with stage 3 chronic kidney disease, with long-term current use of insulin ([place]) 07/17/2016  . VT (ventricular tachycardia) ([place]) 05/01/2015    Past Surgical History:  Procedure Laterality Date  . ABDOMINAL HYSTERECTOMY    . ANKLE FRACTURE SURGERY Bilateral    6 months apart, screws  . CARDIAC CATHETERIZATION     2003, 2009, 2016  . CAROTID STENT  06/21/2015   thoracic ao, 4 vessel cerebral arteriogram & rt internal carotid artery pta/stent CPT 63016  . CATARACT  EXTRACTION Right    With lens replaced  . COLONOSCOPY  2013  . CORONARY STENT PLACEMENT    . PACEMAKER PLACEMENT     has two, one working and one not, could not remove old one, Dr. Elonda Husky     Current Medications: Current Meds  Medication Sig  . amiodarone (PACERONE) 200 MG tablet TAKE ONE-HALF TABLET AS  DIRECTEDT ON MONDAY,  WEDNESDAY, AND FRIDAY  . aspirin EC 81 MG tablet Take 81 mg by mouth daily.  Marland Kitchen atorvastatin (LIPITOR) 80 MG tablet Take 80 mg by mouth daily.  . carvedilol (COREG) 12.5 MG tablet Take 6.25 mg by mouth 2 (two) times daily.  . clopidogrel (PLAVIX) 75 MG tablet Take 75 mg by mouth daily.  . furosemide (LASIX) 20 MG tablet Take 1 tablet (20 mg total) by mouth daily.  Marland Kitchen HUMALOG 100 UNIT/ML injection Inject 25 units in the am, 25 units at lunch, and 25 units at supper  . LANTUS SOLOSTAR 100 UNIT/ML Solostar Pen Inject 10 units at bedtime  . levothyroxine (SYNTHROID, LEVOTHROID) 75 MCG tablet Take 75 mcg by mouth daily before breakfast.   . Multiple Vitamin (MULTI-VITAMINS) TABS Take 1 tablet by mouth daily.  . pantoprazole (PROTONIX) 40 MG tablet Take 1 tablet (40 mg total) by mouth daily.  . ranolazine (RANEXA) 1000 MG SR tablet Take 1,000 mg by mouth 2 (two) times daily.  . sertraline (ZOLOFT) 25 MG tablet Take 25 mg by mouth daily.  Marland Kitchen spironolactone (ALDACTONE) 25 MG tablet Take 12.5 mg by mouth daily.  Marland Kitchen zolpidem (AMBIEN CR) 12.5 MG CR tablet Take 12.5 mg by mouth at bedtime.  . [DISCONTINUED] candesartan (ATACAND) 8 MG tablet   . [DISCONTINUED] candesartan (ATACAND) 8 MG tablet TAKE 1 TABLET BY MOUTH  DAILY  . [DISCONTINUED] sacubitril-valsartan (ENTRESTO) 49-51 MG Take 0.5 tablets by mouth 2 (two) times daily. At midday      Allergies:   Shellfish allergy and Sulfa antibiotics   Social History   Socioeconomic History  . Marital status: Married    Spouse name: Not on file  . Number of children: Not on file  . Years of education: Not on file  . Highest  education level: Not on file  Occupational History  . Not on file  Social Needs  . Financial resource strain: Not on file  . Food insecurity:    Worry: Not on file    Inability: Not on file  . Transportation needs:    Medical: Not on file    Non-medical: Not on file  Tobacco Use  . Smoking status: Never Smoker  . Smokeless tobacco: Never Used  Substance and Sexual Activity  . Alcohol use: No  . Drug use: No  . Sexual activity: Not on file  Lifestyle  . Physical activity:    Days per week: Not on file    Minutes per session: Not on file  . Stress: Not on file  Relationships  . Social connections:  Talks on phone: Not on file    Gets together: Not on file    Attends religious service: Not on file    Active member of club or organization: Not on file    Attends meetings of clubs or organizations: Not on file    Relationship status: Not on file  Other Topics Concern  . Not on file  Social History Narrative  . Not on file     Family History: The patient's family history includes Brain cancer in her mother; Breast cancer in her sister; Diabetes in her sister; Heart attack in her father; Heart disease in her father; Rectal cancer in her daughter. ROS:   Please see the history of present illness.    All other systems reviewed and are negative.  EKGs/Labs/Other Studies Reviewed:    The following studies were reviewed today:  EKG:  EKG ordered today.  The ekg ordered today demonstrates [facility] no ischemic changes CXR July no amiodarone toxicity Recent Labs:   11/14/2017 cholesterol 126 LDL at target 64 HDL 40 creatinine 1.74 TSH normal 08/19/2017: BNP 348.0 11/14/2017: ALT 16; TSH 3.780 02/11/2018: BUN 35; Creatinine, Ser 1.74; NT-Pro BNP 2,749; Potassium 4.9; Sodium 138  Recent Lipid Panel    Component Value Date/Time   CHOL 126 11/14/2017 1015   TRIG 110 11/14/2017 1015   HDL 40 11/14/2017 1015   CHOLHDL 3.2 11/14/2017 1015   LDLCALC 64 11/14/2017 1015    Physical  Exam:    VS:  BP 112/64 (BP Location: Right Arm, Patient Position: Sitting, Cuff Size: Normal)   Pulse 72   Ht '5\' 5"'  (1.651 m)   Wt 188 lb (85.3 kg)   SpO2 96%   BMI 31.28 kg/m     Wt Readings from Last 3 Encounters:  05/27/18 188 lb (85.3 kg)  02/11/18 191 lb (86.6 kg)  11/14/17 194 lb 1.9 oz (88.1 kg)     GEN:  Well nourished, well developed in no acute distress HEENT: Normal NECK: No JVD; No carotid bruits LYMPHATICS: No lymphadenopathy CARDIAC: Soft s1 RRR, no murmurs, rubs, gallops RESPIRATORY:  Clear to auscultation without rales, wheezing or rhonchi  ABDOMEN: Soft, non-tender, non-distended MUSCULOSKELETAL:  No edema; No deformity  SKIN: Warm and dry NEUROLOGIC:  Alert and oriented x 3 PSYCHIATRIC:  Normal affect    Signed, Shirlee More, MD  05/27/2018 12:13 PM    [facility]

## 2018-05-27 ENCOUNTER — Encounter: Payer: Self-pay | Admitting: Cardiology

## 2018-05-27 ENCOUNTER — Ambulatory Visit (INDEPENDENT_AMBULATORY_CARE_PROVIDER_SITE_OTHER): Payer: Medicare Other | Admitting: Cardiology

## 2018-05-27 ENCOUNTER — Other Ambulatory Visit: Payer: Self-pay | Admitting: *Deleted

## 2018-05-27 VITALS — BP 112/64 | HR 72 | Ht 65.0 in | Wt 188.0 lb

## 2018-05-27 DIAGNOSIS — I130 Hypertensive heart and chronic kidney disease with heart failure and stage 1 through stage 4 chronic kidney disease, or unspecified chronic kidney disease: Secondary | ICD-10-CM

## 2018-05-27 DIAGNOSIS — I5022 Chronic systolic (congestive) heart failure: Secondary | ICD-10-CM

## 2018-05-27 DIAGNOSIS — I501 Left ventricular failure: Secondary | ICD-10-CM

## 2018-05-27 DIAGNOSIS — E7800 Pure hypercholesterolemia, unspecified: Secondary | ICD-10-CM | POA: Diagnosis not present

## 2018-05-27 DIAGNOSIS — I25119 Atherosclerotic heart disease of native coronary artery with unspecified angina pectoris: Secondary | ICD-10-CM

## 2018-05-27 DIAGNOSIS — Z79899 Other long term (current) drug therapy: Secondary | ICD-10-CM

## 2018-05-27 DIAGNOSIS — Z95810 Presence of automatic (implantable) cardiac defibrillator: Secondary | ICD-10-CM

## 2018-05-27 DIAGNOSIS — N184 Chronic kidney disease, stage 4 (severe): Secondary | ICD-10-CM

## 2018-05-27 LAB — COMPREHENSIVE METABOLIC PANEL
A/G RATIO: 1.8 (ref 1.2–2.2)
ALK PHOS: 90 IU/L (ref 39–117)
ALT: 14 IU/L (ref 0–32)
AST: 15 IU/L (ref 0–40)
Albumin: 4.5 g/dL (ref 3.5–4.8)
BUN/Creatinine Ratio: 16 (ref 12–28)
BUN: 33 mg/dL — ABNORMAL HIGH (ref 8–27)
Bilirubin Total: 0.6 mg/dL (ref 0.0–1.2)
CALCIUM: 9 mg/dL (ref 8.7–10.3)
CO2: 22 mmol/L (ref 20–29)
CREATININE: 2.01 mg/dL — AB (ref 0.57–1.00)
Chloride: 98 mmol/L (ref 96–106)
GFR calc Af Amer: 28 mL/min/{1.73_m2} — ABNORMAL LOW (ref >59)
GFR calc non Af Amer: 24 mL/min/{1.73_m2} — ABNORMAL LOW (ref >59)
GLOBULIN, TOTAL: 2.5 g/dL (ref 1.5–4.5)
Glucose: 284 mg/dL — ABNORMAL HIGH (ref 65–99)
Potassium: 4.7 mmol/L (ref 3.5–5.2)
SODIUM: 135 mmol/L (ref 134–144)
Total Protein: 7 g/dL (ref 6.0–8.5)

## 2018-05-27 LAB — LIPID PANEL
Chol/HDL Ratio: 3.2 ratio (ref 0.0–4.4)
Cholesterol, Total: 131 mg/dL (ref 100–199)
HDL: 41 mg/dL (ref >39)
LDL CALC: 62 mg/dL (ref 0–99)
Triglycerides: 139 mg/dL (ref 0–149)
VLDL CHOLESTEROL CAL: 28 mg/dL (ref 5–40)

## 2018-05-27 LAB — PRO B NATRIURETIC PEPTIDE: NT-Pro BNP: 3759 pg/mL — ABNORMAL HIGH (ref 0–301)

## 2018-05-27 MED ORDER — SACUBITRIL-VALSARTAN 49-51 MG PO TABS: 0.5000 | ORAL_TABLET | Freq: Two times a day (BID) | ORAL | 3 refills | Status: DC

## 2018-05-27 NOTE — Patient Instructions (Addendum)
Medication Instructions:  Your physician has recommended you make the following change in your medication:   STOP candesartan  Labwork: Your physician recommends that you return for lab work today: CMP, lipid panel, ProBNP.   Testing/Procedures: You had an EKG today.   Follow-Up: Your physician wants you to follow-up in: 3 months. You will receive a reminder letter in the mail two months in advance. If you don't receive a letter, please call our office to schedule the follow-up appointment.   If you need a refill on your cardiac medications before your next appointment, please call your pharmacy.   Thank you for choosing [facility]! Robyne Peers, RN [phone]      Heart Failure  Weigh yourself every morning when you first wake up and record on a calender or note pad, bring this to your office visits. Using a pill tender can help with taking your medications consistently.  Limit your fluid intake to 2 liters daily  Limit your sodium intake to less than 2-3 grams daily. Ask if you need dietary teaching.  If you gain more than 3 pounds (from your dry weight ), double your dose of diuretic for the day.  If you gain more than 5 pounds (from your dry weight), double your dose of lasix and call your heart failure doctor.  Please do not smoke tobacco since it is very bad for your heart.  Please do not drink alcohol since it can worsen your heart failure.Also avoid OTC nonsteroidal drugs, such as advil, aleve and motrin.  Try to exercise for at least 30 minutes every day because this will help your heart be more efficient. You may be eligible for supervised cardiac rehab, ask your physician.

## 2018-06-08 ENCOUNTER — Telehealth: Payer: Self-pay

## 2018-06-08 MED ORDER — CARVEDILOL 12.5 MG PO TABS: 6.2500 mg | ORAL_TABLET | Freq: Two times a day (BID) | ORAL | 3 refills | Status: DC

## 2018-06-08 NOTE — Telephone Encounter (Signed)
Rx sent to pharmacy as requested.

## 2018-06-12 ENCOUNTER — Telehealth: Payer: Self-pay | Admitting: Cardiology

## 2018-06-12 NOTE — Telephone Encounter (Signed)
[facility] called to report possible drug interractions. Please call

## 2018-06-22 DIAGNOSIS — Z23 Encounter for immunization: Secondary | ICD-10-CM | POA: Diagnosis not present

## 2018-06-24 ENCOUNTER — Ambulatory Visit (INDEPENDENT_AMBULATORY_CARE_PROVIDER_SITE_OTHER): Payer: Medicare Other | Admitting: *Deleted

## 2018-06-24 ENCOUNTER — Telehealth: Payer: Self-pay

## 2018-06-24 DIAGNOSIS — I255 Ischemic cardiomyopathy: Secondary | ICD-10-CM | POA: Diagnosis not present

## 2018-06-24 NOTE — Telephone Encounter (Signed)
Spoke with pt and reminded pt of remote transmission that is due today. Pt verbalized understanding.   

## 2018-06-25 NOTE — Progress Notes (Signed)
Remote ICD transmission.   

## 2018-06-26 ENCOUNTER — Telehealth: Payer: Self-pay | Admitting: Cardiology

## 2018-06-26 NOTE — Telephone Encounter (Signed)
Has questions about her entresto

## 2018-06-26 NOTE — Telephone Encounter (Signed)
Unable to up titrations with hypotension

## 2018-06-26 NOTE — Telephone Encounter (Signed)
Unsure of why the dose is 0.5 tab BID??

## 2018-06-29 MED ORDER — SACUBITRIL-VALSARTAN 24-26 MG PO TABS: 1.0000 | ORAL_TABLET | Freq: Two times a day (BID) | ORAL | 3 refills | Status: DC

## 2018-06-29 NOTE — Telephone Encounter (Signed)
Clarified with Dr. Bettina Gavia that patient has been taking entresto 49-51 mg 0.5 tablet twice daily due to hypotension. Informed [facility] pharmacist of this and she recommended to change patient to 24-26 mg tablets since this medication is not scored and is difficult to cut. Informed pharmacist this change can be made. Medication list corrected and patient notified of change.

## 2018-06-29 NOTE — Telephone Encounter (Signed)
Please clarify with Dr. Bettina Gavia that we are in fact only giving 0.5 tablet, just want to be super sure before we okay this. Thanks.

## 2018-06-29 NOTE — Addendum Note (Signed)
Addended by: Austin Miles on: 06/29/2018 02:42 PM   Modules accepted: Orders

## 2018-07-03 ENCOUNTER — Telehealth: Payer: Self-pay

## 2018-07-03 NOTE — Telephone Encounter (Signed)
Left message for patient to return call regarding fax from [facility] about Entresto and Atacand.

## 2018-07-03 NOTE — Telephone Encounter (Signed)
Spoke with patient and she is not currently taking Atacand.

## 2018-07-07 ENCOUNTER — Ambulatory Visit: Payer: Medicare Other | Admitting: Cardiology

## 2018-07-07 ENCOUNTER — Encounter: Payer: Self-pay | Admitting: Cardiology

## 2018-07-07 VITALS — BP 126/60 | HR 92 | Ht 65.0 in | Wt 188.0 lb

## 2018-07-07 DIAGNOSIS — I255 Ischemic cardiomyopathy: Secondary | ICD-10-CM | POA: Diagnosis not present

## 2018-07-07 DIAGNOSIS — I5022 Chronic systolic (congestive) heart failure: Secondary | ICD-10-CM | POA: Diagnosis not present

## 2018-07-07 DIAGNOSIS — I472 Ventricular tachycardia, unspecified: Secondary | ICD-10-CM

## 2018-07-07 DIAGNOSIS — I25118 Atherosclerotic heart disease of native coronary artery with other forms of angina pectoris: Secondary | ICD-10-CM

## 2018-07-07 NOTE — Progress Notes (Signed)
Electrophysiology Office Note   Date:  07/07/2018   ID:  Jenna West, DOB Mar 26, 1946, MRN [id number]  PCP:  Ronita Hipps, MD  Cardiologist:  Bettina Gavia Primary Electrophysiologist:  Lovelyn Sheeran Meredith Leeds, MD    No chief complaint on file.    History of Present Illness: Jenna West is a 72 y.o. female who is being seen today for the evaluation of CHF at the request of Ronita Hipps, MD. Presenting today for electrophysiology evaluation. History of coronary disease status post PCI to the LAD and RCA in 2000 for an LAD in 2009 with severe LV dysfunction and an EF less than 20%, CHF, hypertension, VT on amiodarone with an ICD, stage IV CKG. She had a Myoview that showed extensive scar and LV dysfunction with an EF of 60%. EF in 2016 was 40%.  Today, denies symptoms of palpitations, chest pain, shortness of breath, orthopnea, PND, lower extremity edema, claudication, dizziness, presyncope, syncope, bleeding, or neurologic sequela. The patient is tolerating medications without difficulties.  Overall she is feeling well.  She has no chest pain or shortness of breath.  She is able to do all of her daily activities without restriction.  Past Medical History:  Diagnosis Date  . Acquired hypothyroidism 05/01/2015  . Angina pectoris ([place]) 07/13/2015  . Aspirin long-term use 03/07/2016  . Benign essential hypertension 05/01/2015  . Bilateral carotid artery stenosis 03/07/2016  . Cardiomyopathy ([place]) 05/01/2015   Overview:  EF 20% with aneurysm of the apex  . Carotid artery stenosis 06/21/2015  . Coronary arteriosclerosis in native artery 05/01/2015   Overview:  Overview:  PCI and stent to LAD and RCA 2004 for MI DES to LAD for subtotal occlusion with no reflow 8/09  . Coronary artery disease of native artery of native heart with stable angina pectoris ([place]) 05/01/2015   Overview:  PCI and stent to LAD and RCA 2004 for MI DES to LAD for subtotal occlusion with no reflow 2009  . ICD (implantable  cardioverter-defibrillator) in place 05/01/2015   Overview:  Medtronic  . Left heart failure ([place]) 05/01/2015  . MI (myocardial infarction) ([place]) 05/01/2015  . On amiodarone therapy 08/15/2016  . Pure hypercholesterolemia 05/01/2015  . Stage 3 chronic kidney disease ([place]) 12/18/2016  . Syncope and collapse 05/01/2015  . Type 2 diabetes mellitus with stage 3 chronic kidney disease, with long-term current use of insulin ([place]) 07/17/2016  . VT (ventricular tachycardia) ([place]) 05/01/2015   Past Surgical History:  Procedure Laterality Date  . ABDOMINAL HYSTERECTOMY    . ANKLE FRACTURE SURGERY Bilateral    6 months apart, screws  . CARDIAC CATHETERIZATION     2003, 2009, 2016  . CAROTID STENT  06/21/2015   thoracic ao, 4 vessel cerebral arteriogram & rt internal carotid artery pta/stent CPT 78295  . CATARACT EXTRACTION Right    With lens replaced  . COLONOSCOPY  2013  . CORONARY STENT PLACEMENT    . PACEMAKER PLACEMENT     has two, one working and one not, could not remove old one, Dr. Elonda Husky      Current Outpatient Medications  Medication Sig Dispense Refill  . amiodarone (PACERONE) 200 MG tablet TAKE ONE-HALF TABLET AS  DIRECTEDT ON MONDAY,  WEDNESDAY, AND FRIDAY 90 tablet 3  . aspirin EC 81 MG tablet Take 81 mg by mouth daily.    Marland Kitchen atorvastatin (LIPITOR) 80 MG tablet Take 80 mg by mouth daily.    . carvedilol (COREG) 12.5 MG tablet Take 0.5  tablets (6.25 mg total) by mouth 2 (two) times daily. 180 tablet 3  . clopidogrel (PLAVIX) 75 MG tablet Take 75 mg by mouth daily.    . furosemide (LASIX) 20 MG tablet Take 1 tablet (20 mg total) by mouth daily. 90 tablet 3  . HUMALOG 100 UNIT/ML injection Inject 25 units in the am, 25 units at lunch, and 25 units at supper    . LANTUS SOLOSTAR 100 UNIT/ML Solostar Pen Inject 10 units at bedtime    . levothyroxine (SYNTHROID, LEVOTHROID) 75 MCG tablet Take 75 mcg by mouth daily before breakfast.     . Multiple Vitamin (MULTI-VITAMINS) TABS Take 1 tablet  by mouth daily.    . pantoprazole (PROTONIX) 40 MG tablet Take 1 tablet (40 mg total) by mouth daily. 90 tablet 3  . ranolazine (RANEXA) 1000 MG SR tablet Take 1,000 mg by mouth 2 (two) times daily.    . sacubitril-valsartan (ENTRESTO) 24-26 MG Take 1 tablet by mouth 2 (two) times daily. 180 tablet 3  . sertraline (ZOLOFT) 25 MG tablet Take 25 mg by mouth daily.    Marland Kitchen spironolactone (ALDACTONE) 25 MG tablet Take 12.5 mg by mouth daily.    Marland Kitchen zolpidem (AMBIEN CR) 12.5 MG CR tablet Take 12.5 mg by mouth at bedtime.     No current facility-administered medications for this visit.     Allergies:   Shellfish allergy and Sulfa antibiotics   Social History:  The patient  reports that she has never smoked. She has never used smokeless tobacco. She reports that she does not drink alcohol or use drugs.   Family History:  The patient's family history includes Brain cancer in her mother; Breast cancer in her sister; Diabetes in her sister; Heart attack in her father; Heart disease in her father; Rectal cancer in her daughter.    ROS:  Please see the history of present illness.   Otherwise, review of systems is positive for none.   All other systems are reviewed and negative.   PHYSICAL EXAM: VS:  BP 126/60   Pulse 92   Ht 5\' 5"  (1.651 m)   Wt 188 lb (85.3 kg)   SpO2 99%   BMI 31.28 kg/m  , BMI Body mass index is 31.28 kg/m. GEN: Well nourished, well developed, in no acute distress  HEENT: normal  Neck: no JVD, carotid bruits, or masses Cardiac: RRR; no murmurs, rubs, or gallops,no edema  Respiratory:  clear to auscultation bilaterally, normal work of breathing GI: soft, nontender, nondistended, + BS MS: no deformity or atrophy  Skin: warm and dry, device site well healed Neuro:  Strength and sensation are intact Psych: euthymic mood, full affect  EKG:  EKG is not ordered today. Personal review of the ekg ordered 05/27/18 shows A paced, rate 72, lateral MI  Personal review of the device  interrogation today. Results in [place]: 08/19/2017: BNP 348.0 11/14/2017: TSH 3.780 05/27/2018: ALT 14; BUN 33; Creatinine, Ser 2.01; NT-Pro BNP 3,759; Potassium 4.7; Sodium 135    Lipid Panel     Component Value Date/Time   CHOL 131 05/27/2018 0000   TRIG 139 05/27/2018 0000   HDL 41 05/27/2018 0000   CHOLHDL 3.2 05/27/2018 0000   LDLCALC 62 05/27/2018 0000     Wt Readings from Last 3 Encounters:  07/07/18 188 lb (85.3 kg)  05/27/18 188 lb (85.3 kg)  02/11/18 191 lb (86.6 kg)      Other studies Reviewed: Additional studies/  records that were reviewed today include: Myoview 06/03/17  Review of the above records today demonstrates:   Nuclear stress EF: 6%.  There was no ST segment deviation noted during stress.  Defect 1: There is a large defect of severe severity present in the mid anterior, mid anteroseptal, mid inferoseptal, apical anterior, apical septal, apical inferior, apical lateral and apex location.  Findings consistent with prior myocardial infarction.  This is a high risk study.  The left ventricular ejection fraction is severely decreased (<30%).   High risk stress nuclear study with a very large and severe scar/aneurysm involving the distal 2/3 of the left ventricular myocardium. There is a minimal peri-infarct ischemia, but meaningful areas of reversible ischemia are not identified.   ASSESSMENT AND PLAN:  1.  Chronic systolic heart failure due to ischemic cardiomyopathy: On carvedilol and Entresto.  Is feeling much better since being on Entresto.  Continue with current management.  2. Coronary artery disease with chronic stable angina: Doing well without complaints.  No current management changes.  3. Ventricular tachycardia: Seen on device interrogation.  She is on low doses of amiodarone.  She is felt much better since being on Entresto.  Minimal since last check.  No changes.   Current medicines are reviewed at length with the  patient today.   The patient does not have concerns regarding her medicines.  The following changes were made today: None  Labs/ tests ordered today include:  No orders of the defined types were placed in this encounter.    Disposition:   FU with Dewey Neukam 1 year  Signed, Lavene Penagos Meredith Leeds, MD  07/07/2018 11:56 AM     [facility] [address] [place] [address] [phone] (office) [phone] (fax)

## 2018-07-07 NOTE — Patient Instructions (Addendum)
Medication Instructions:  Your physician recommends that you continue on your current medications as directed. Please refer to the Current Medication list given to you today.  If you need a refill on your cardiac medications before your next appointment, please call your pharmacy.   Lab work: None ordered  Testing/Procedures: None oredered  Follow-Up: Remote monitoring is used to Engineer, materials of ICD from home. This monitoring reduces the number of office visits required to check your device to one time per year. It allows [place] to keep an eye on the functioning of your device to ensure it is working properly. You are scheduled for a device check from home on 09/23/2018. You may send your transmission at any time that day. If you have a wireless device, the transmission will be sent automatically. After your physician reviews your transmission, you will receive a postcard with your next transmission date.  At [facility], you and your health needs are our priority.  As part of our continuing mission to provide you with exceptional heart care, we have created designated Provider Care Teams.  These Care Teams include your primary Cardiologist (physician) and Advanced Practice Providers (APPs -  Physician Assistants and Nurse Practitioners) who all work together to provide you with the care you need, when you need it. You will need a follow up appointment in 1 year.  Please call our office 2 months in advance to schedule this appointment.  You may see Dr. Curt Bears or one of the following Advanced Practice Providers on your designated Care Team:   Chanetta Marshall, NP . Tommye Standard, PA-C  Thank you for choosing [facility]!!   Trinidad Curet, RN [phone]

## 2018-07-08 LAB — CUP PACEART INCLINIC DEVICE CHECK
Date Time Interrogation Session: 20191023083308
Implantable Lead Implant Date: 20091013
Implantable Lead Implant Date: 20091013
Implantable Lead Model: 4076
Implantable Lead Model: 6947
Implantable Pulse Generator Implant Date: 20150521
MDC IDC LEAD LOCATION: 753859
MDC IDC LEAD LOCATION: 753860

## 2018-07-24 ENCOUNTER — Other Ambulatory Visit: Payer: Self-pay

## 2018-07-24 NOTE — Patient Outreach (Signed)
Jenna West [facility]) Care Management  07/24/2018  [name] 1945/10/17 [id number]   Medication Adherence call to Jenna West left for patient to call back patient is due on Atorvastatin 80 mg. Jenna West is showing past due under [facility] ins.   [place] Management Direct Dial [phone]  Fax [phone] [email or web address]

## 2018-07-29 LAB — CUP PACEART REMOTE DEVICE CHECK
Battery Voltage: 2.96 V
Brady Statistic AP VP Percent: 60.49 %
Brady Statistic AS VP Percent: 0 %
Brady Statistic AS VS Percent: 0.09 %
Brady Statistic RV Percent Paced: 60.48 %
HIGH POWER IMPEDANCE MEASURED VALUE: 51 Ohm
HighPow Impedance: 41 Ohm
Implantable Lead Implant Date: 20091013
Implantable Lead Location: 753859
Implantable Lead Model: 6947
Implantable Pulse Generator Implant Date: 20150521
Lead Channel Impedance Value: 342 Ohm
Lead Channel Impedance Value: 380 Ohm
Lead Channel Pacing Threshold Amplitude: 0.625 V
Lead Channel Pacing Threshold Amplitude: 0.75 V
Lead Channel Pacing Threshold Pulse Width: 0.4 ms
Lead Channel Sensing Intrinsic Amplitude: 2.75 mV
Lead Channel Setting Pacing Amplitude: 2 V
Lead Channel Setting Sensing Sensitivity: 0.3 mV
MDC IDC LEAD IMPLANT DT: 20091013
MDC IDC LEAD LOCATION: 753860
MDC IDC MSMT BATTERY REMAINING LONGEVITY: 39 mo
MDC IDC MSMT LEADCHNL RA PACING THRESHOLD PULSEWIDTH: 0.4 ms
MDC IDC MSMT LEADCHNL RA SENSING INTR AMPL: 2.75 mV
MDC IDC MSMT LEADCHNL RV IMPEDANCE VALUE: 456 Ohm
MDC IDC MSMT LEADCHNL RV SENSING INTR AMPL: 18.75 mV
MDC IDC MSMT LEADCHNL RV SENSING INTR AMPL: 18.75 mV
MDC IDC SESS DTM: 20191009184640
MDC IDC SET LEADCHNL RA PACING AMPLITUDE: 1.5 V
MDC IDC SET LEADCHNL RV PACING PULSEWIDTH: 0.4 ms
MDC IDC STAT BRADY AP VS PERCENT: 39.42 %
MDC IDC STAT BRADY RA PERCENT PACED: 99.89 %

## 2018-07-30 DIAGNOSIS — E039 Hypothyroidism, unspecified: Secondary | ICD-10-CM | POA: Diagnosis not present

## 2018-07-30 DIAGNOSIS — E119 Type 2 diabetes mellitus without complications: Secondary | ICD-10-CM | POA: Diagnosis not present

## 2018-07-30 DIAGNOSIS — E559 Vitamin D deficiency, unspecified: Secondary | ICD-10-CM | POA: Diagnosis not present

## 2018-07-30 DIAGNOSIS — N183 Chronic kidney disease, stage 3 (moderate): Secondary | ICD-10-CM | POA: Diagnosis not present

## 2018-08-06 DIAGNOSIS — E039 Hypothyroidism, unspecified: Secondary | ICD-10-CM | POA: Diagnosis not present

## 2018-08-06 DIAGNOSIS — Z0000 Encounter for general adult medical examination without abnormal findings: Secondary | ICD-10-CM | POA: Diagnosis not present

## 2018-08-06 DIAGNOSIS — E559 Vitamin D deficiency, unspecified: Secondary | ICD-10-CM | POA: Diagnosis not present

## 2018-08-06 DIAGNOSIS — E1165 Type 2 diabetes mellitus with hyperglycemia: Secondary | ICD-10-CM | POA: Diagnosis not present

## 2018-08-27 ENCOUNTER — Ambulatory Visit: Payer: Medicare Other | Admitting: Cardiology

## 2018-08-31 NOTE — Progress Notes (Signed)
Cardiology Office Note:    Date:  09/02/2018   ID:  DARRELL LEONHARDT, DOB 09/21/1945, MRN [id number]  PCP:  Ronita Hipps, MD  Cardiologist:  Shirlee More, MD    Referring MD: Ronita Hipps, MD    ASSESSMENT:    1. Chronic systolic heart failure ([place])   2. 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 Coronary artery disease of native artery of native heart with stable angina pectoris ([place])   4. Acquired hypothyroidism   5. Pure hypercholesterolemia   6. ICD (implantable cardioverter-defibrillator) in place   7. On amiodarone therapy    PLAN:    In order of problems listed above:  1. Unfortunately despite being on appropriate guideline directed medical therapy and having no volume overload she remains symptomatic New York Heart Association class II is the best results of her current treatment.  She will continue her current medical regimen including her loop diuretic beta-blocker and Entresto  2. stable continue current antihypertensives diuretic 3. Stable recent labs reviewed from PCP office CKD 4. Stable CAD continue medical therapy clopidogrel and high intensity statin beta-blocker ranolazine 5. Stable continue current thyroid supplement 6. Stable continue current statin lipids are ideal recently performed PCP office reviewed 7. Stable device follow-up in our clinic no recent VT VF therapy 8. Continue low-dose amiodarone no evidence of toxicity   Next appointment: 4 months   Medication Adjustments/Labs and Tests Ordered: Current medicines are reviewed at length with the patient today.  Concerns regarding medicines are outlined above.  No orders of the defined types were placed in this encounter.  No orders of the defined types were placed in this encounter.   Chief Complaint  Patient presents with  . Follow-up    ICD on amiodarone  . Coronary Artery Disease  . Cardiomyopathy  .  Congestive Heart Failure  . Chronic Kidney Disease    History of Present Illness:    Jenna West is a 72 y.o. female with a hx of CAD with PCI of LAD and RCA 2004 and LAD 2009 with severe LV dysfunction EF 20-25% and LV aneurysm, CHF, Hypertension, VT on amiodarone and has an ICD, and stage 4 CKD  last seen 05/27/18. Compliance with diet, lifestyle and medications: yes Past Medical History:  Diagnosis Date  . Acquired hypothyroidism 05/01/2015  . Angina pectoris ([place]) 07/13/2015  . Aspirin long-term use 03/07/2016  . Benign essential hypertension 05/01/2015  . Bilateral carotid artery stenosis 03/07/2016  . Cardiomyopathy ([place]) 05/01/2015   Overview:  EF 20% with aneurysm of the apex  . Carotid artery stenosis 06/21/2015  . Coronary arteriosclerosis in native artery 05/01/2015   Overview:  Overview:  PCI and stent to LAD and RCA 2004 for MI DES to LAD for subtotal occlusion with no reflow 8/09  . Coronary artery disease of native artery of native heart with stable angina pectoris ([place]) 05/01/2015   Overview:  PCI and stent to LAD and RCA 2004 for MI DES to LAD for subtotal occlusion with no reflow 2009  . ICD (implantable cardioverter-defibrillator) in place 05/01/2015   Overview:  [employer]  . Left heart failure ([place]) 05/01/2015  . MI (myocardial infarction) ([place]) 05/01/2015  . On amiodarone therapy 08/15/2016  . Pure hypercholesterolemia 05/01/2015  . Stage 3 chronic kidney disease ([place]) 12/18/2016  . Syncope and collapse 05/01/2015  . Type 2 diabetes mellitus with stage 3 chronic kidney disease,  with long-term current use of insulin ([place]) 07/17/2016  . VT (ventricular tachycardia) ([place]) 05/01/2015    Past Surgical History:  Procedure Laterality Date  . ABDOMINAL HYSTERECTOMY    . ANKLE FRACTURE SURGERY Bilateral    6 months apart, screws  . CARDIAC CATHETERIZATION     2003, 2009, 2016  . CAROTID STENT  06/21/2015   thoracic ao, 4 vessel cerebral arteriogram & rt internal carotid  artery pta/stent CPT 03500  . CATARACT EXTRACTION Right    With lens replaced  . COLONOSCOPY  2013  . CORONARY STENT PLACEMENT    . PACEMAKER PLACEMENT     has two, one working and one not, could not remove old one, Dr. Elonda Husky     Current Medications: Current Meds  Medication Sig  . amiodarone (PACERONE) 200 MG tablet TAKE ONE-HALF TABLET AS  DIRECTEDT ON MONDAY,  WEDNESDAY, AND FRIDAY  . aspirin EC 81 MG tablet Take 81 mg by mouth daily.  Marland Kitchen atorvastatin (LIPITOR) 80 MG tablet Take 80 mg by mouth daily.  . carvedilol (COREG) 12.5 MG tablet Take 0.5 tablets (6.25 mg total) by mouth 2 (two) times daily.  . clopidogrel (PLAVIX) 75 MG tablet Take 75 mg by mouth daily.  . furosemide (LASIX) 20 MG tablet Take 1 tablet (20 mg total) by mouth daily.  Marland Kitchen HUMALOG 100 UNIT/ML injection Inject 25 units in the am, 25 units at lunch, and 25 units at supper  . LANTUS SOLOSTAR 100 UNIT/ML Solostar Pen Inject 10 units at bedtime  . levothyroxine (SYNTHROID, LEVOTHROID) 75 MCG tablet Take 75 mcg by mouth daily before breakfast.   . Multiple Vitamin (MULTI-VITAMINS) TABS Take 1 tablet by mouth daily.  . pantoprazole (PROTONIX) 40 MG tablet Take 1 tablet (40 mg total) by mouth daily.  . ranolazine (RANEXA) 1000 MG SR tablet Take 1,000 mg by mouth 2 (two) times daily.  . sacubitril-valsartan (ENTRESTO) 24-26 MG Take 1 tablet by mouth 2 (two) times daily.  . sertraline (ZOLOFT) 25 MG tablet Take 25 mg by mouth daily.  Marland Kitchen spironolactone (ALDACTONE) 25 MG tablet Take 12.5 mg by mouth daily.  Marland Kitchen zolpidem (AMBIEN CR) 12.5 MG CR tablet Take 12.5 mg by mouth at bedtime.     Allergies:   Shellfish allergy and Sulfa antibiotics   Social History   Socioeconomic History  . Marital status: Married    Spouse name: Not on file  . Number of children: Not on file  . Years of education: Not on file  . Highest education level: Not on file  Occupational History  . Not on file  Social Needs  . Financial resource  strain: Not on file  . Food insecurity:    Worry: Not on file    Inability: Not on file  . Transportation needs:    Medical: Not on file    Non-medical: Not on file  Tobacco Use  . Smoking status: Never Smoker  . Smokeless tobacco: Never Used  Substance and Sexual Activity  . Alcohol use: No  . Drug use: No  . Sexual activity: Not on file  Lifestyle  . Physical activity:    Days per week: Not on file    Minutes per session: Not on file  . Stress: Not on file  Relationships  . Social connections:    Talks on phone: Not on file    Gets together: Not on file    Attends religious service: Not on file    Active member of club or  organization: Not on file    Attends meetings of clubs or organizations: Not on file    Relationship status: Not on file  Other Topics Concern  . Not on file  Social History Narrative  . Not on file     Family History: The patient's family history includes Brain cancer in her mother; Breast cancer in her sister; Diabetes in her sister; Heart attack in her father; Heart disease in her father; Rectal cancer in her daughter. ROS:   Please see the history of present illness.    All other systems reviewed and are negative.  EKGs/Labs/Other Studies Reviewed:    The following studies were reviewed today:   Recent Labs:   07/30/18 Chol 134 LDL 72 Cr 1.7 TSH 7.5 Aic 8.5% 11/14/2017: TSH 3.780 05/27/2018: ALT 14; BUN 33; Creatinine, Ser 2.01; NT-Pro BNP 3,759; Potassium 4.7; Sodium 135  Recent Lipid Panel    Component Value Date/Time   CHOL 131 05/27/2018 0000   TRIG 139 05/27/2018 0000   HDL 41 05/27/2018 0000   CHOLHDL 3.2 05/27/2018 0000   LDLCALC 62 05/27/2018 0000    Physical Exam:    VS:  BP 116/64 (BP Location: Right Arm, Patient Position: Sitting, Cuff Size: Normal)   Pulse 78   Ht 5\' 5"  (1.651 m)   Wt 188 lb 6 oz (85.4 kg)   SpO2 99%   BMI 31.35 kg/m     Wt Readings from Last 3 Encounters:  09/02/18 188 lb 6 oz (85.4 kg)  07/07/18  188 lb (85.3 kg)  05/27/18 188 lb (85.3 kg)     GEN:  Well nourished, well developed in no acute distress HEENT: Normal NECK: No JVD; No carotid bruits LYMPHATICS: No lymphadenopathy CARDIAC: RRR, no murmurs, rubs, gallops RESPIRATORY:  Clear to auscultation without rales, wheezing or rhonchi  ABDOMEN: Soft, non-tender, non-distended MUSCULOSKELETAL:  No edema; No deformity  SKIN: Warm and dry NEUROLOGIC:  Alert and oriented x 3 PSYCHIATRIC:  Normal affect    Signed, Shirlee More, MD  09/02/2018 10:54 AM    [place]

## 2018-09-02 ENCOUNTER — Ambulatory Visit (INDEPENDENT_AMBULATORY_CARE_PROVIDER_SITE_OTHER): Payer: Medicare Other | Admitting: Cardiology

## 2018-09-02 ENCOUNTER — Encounter: Payer: Self-pay | Admitting: Cardiology

## 2018-09-02 VITALS — BP 116/64 | HR 78 | Ht 65.0 in | Wt 188.4 lb

## 2018-09-02 DIAGNOSIS — E7800 Pure hypercholesterolemia, unspecified: Secondary | ICD-10-CM

## 2018-09-02 DIAGNOSIS — I5022 Chronic systolic (congestive) heart failure: Secondary | ICD-10-CM

## 2018-09-02 DIAGNOSIS — I130 Hypertensive heart and chronic kidney disease with heart failure and stage 1 through stage 4 chronic kidney disease, or unspecified chronic kidney disease: Secondary | ICD-10-CM | POA: Diagnosis not present

## 2018-09-02 DIAGNOSIS — I25118 Atherosclerotic heart disease of native coronary artery with other forms of angina pectoris: Secondary | ICD-10-CM | POA: Diagnosis not present

## 2018-09-02 DIAGNOSIS — N184 Chronic kidney disease, stage 4 (severe): Secondary | ICD-10-CM

## 2018-09-02 DIAGNOSIS — Z79899 Other long term (current) drug therapy: Secondary | ICD-10-CM

## 2018-09-02 DIAGNOSIS — Z95810 Presence of automatic (implantable) cardiac defibrillator: Secondary | ICD-10-CM

## 2018-09-02 DIAGNOSIS — E039 Hypothyroidism, unspecified: Secondary | ICD-10-CM

## 2018-09-02 NOTE — Patient Instructions (Signed)
Medication Instructions:  Your physician recommends that you continue on your current medications as directed. Please refer to the Current Medication list given to you today.  If you need a refill on your cardiac medications before your next appointment, please call your pharmacy.   Lab work: NONE If you have labs (blood work) drawn today and your tests are completely normal, you will receive your results only by: Marland Kitchen MyChart Message (if you have MyChart) OR . A paper copy in the mail If you have any lab test that is abnormal or we need to change your treatment, we will call you to review the results.  Testing/Procedures: NONE  Follow-Up: At [facility], you and your health needs are our priority.  As part of our continuing mission to provide you with exceptional heart care, we have created designated Provider Care Teams.  These Care Teams include your primary Cardiologist (physician) and Advanced Practice Providers (APPs -  Physician Assistants and Nurse Practitioners) who all work together to provide you with the care you need, when you need it. You will need a follow up appointment in 4 months.  Please call our office 2 months in advance to schedule this appointment.

## 2018-09-21 ENCOUNTER — Telehealth: Payer: Self-pay

## 2018-09-21 ENCOUNTER — Other Ambulatory Visit: Payer: Self-pay | Admitting: *Deleted

## 2018-09-21 MED ORDER — SACUBITRIL-VALSARTAN 24-26 MG PO TABS: 1.0000 | ORAL_TABLET | Freq: Two times a day (BID) | ORAL | 3 refills | Status: DC

## 2018-09-21 NOTE — Telephone Encounter (Signed)
Patient needs paper work completed for [employer] Patient Assistance.  Paper work will be signed by Dr Bettina Gavia.  Rx printed to fax in with paper work.

## 2018-09-23 ENCOUNTER — Ambulatory Visit: Payer: Medicare Other

## 2018-09-24 ENCOUNTER — Encounter: Payer: Self-pay | Admitting: Cardiology

## 2018-09-26 LAB — CUP PACEART REMOTE DEVICE CHECK
Battery Voltage: 2.96 V
Brady Statistic AP VP Percent: 63.64 %
Brady Statistic AP VS Percent: 36.08 %
Brady Statistic AS VS Percent: 0.28 %
Brady Statistic RA Percent Paced: 99.71 %
Brady Statistic RV Percent Paced: 63.64 %
HIGH POWER IMPEDANCE MEASURED VALUE: 46 Ohm
HighPow Impedance: 39 Ohm
Implantable Lead Implant Date: 20091013
Implantable Lead Location: 753859
Implantable Lead Model: 4076
Implantable Lead Model: 6947
Implantable Pulse Generator Implant Date: 20150521
Lead Channel Impedance Value: 380 Ohm
Lead Channel Impedance Value: 380 Ohm
Lead Channel Pacing Threshold Pulse Width: 0.4 ms
Lead Channel Sensing Intrinsic Amplitude: 17.375 mV
Lead Channel Sensing Intrinsic Amplitude: 17.375 mV
Lead Channel Sensing Intrinsic Amplitude: 3.25 mV
Lead Channel Setting Pacing Amplitude: 1.5 V
Lead Channel Setting Pacing Amplitude: 2 V
Lead Channel Setting Pacing Pulse Width: 0.4 ms
Lead Channel Setting Sensing Sensitivity: 0.3 mV
MDC IDC LEAD IMPLANT DT: 20091013
MDC IDC LEAD LOCATION: 753860
MDC IDC MSMT BATTERY REMAINING LONGEVITY: 29 mo
MDC IDC MSMT LEADCHNL RA PACING THRESHOLD AMPLITUDE: 0.625 V
MDC IDC MSMT LEADCHNL RA PACING THRESHOLD PULSEWIDTH: 0.4 ms
MDC IDC MSMT LEADCHNL RA SENSING INTR AMPL: 3.25 mV
MDC IDC MSMT LEADCHNL RV IMPEDANCE VALUE: 437 Ohm
MDC IDC MSMT LEADCHNL RV PACING THRESHOLD AMPLITUDE: 0.875 V
MDC IDC SESS DTM: 20200111002337
MDC IDC STAT BRADY AS VP PERCENT: 0 %

## 2018-09-29 ENCOUNTER — Ambulatory Visit (INDEPENDENT_AMBULATORY_CARE_PROVIDER_SITE_OTHER): Payer: Medicare Other

## 2018-09-29 DIAGNOSIS — I5022 Chronic systolic (congestive) heart failure: Secondary | ICD-10-CM | POA: Diagnosis not present

## 2018-09-29 DIAGNOSIS — I255 Ischemic cardiomyopathy: Secondary | ICD-10-CM

## 2018-09-30 NOTE — Progress Notes (Signed)
Remote ICD transmission.   

## 2018-10-08 DIAGNOSIS — J40 Bronchitis, not specified as acute or chronic: Secondary | ICD-10-CM | POA: Diagnosis not present

## 2018-10-08 DIAGNOSIS — J329 Chronic sinusitis, unspecified: Secondary | ICD-10-CM | POA: Diagnosis not present

## 2018-11-24 DIAGNOSIS — Z760 Encounter for issue of repeat prescription: Secondary | ICD-10-CM | POA: Diagnosis not present

## 2018-11-24 DIAGNOSIS — E039 Hypothyroidism, unspecified: Secondary | ICD-10-CM | POA: Diagnosis not present

## 2018-11-24 DIAGNOSIS — Z79899 Other long term (current) drug therapy: Secondary | ICD-10-CM | POA: Diagnosis not present

## 2018-11-24 DIAGNOSIS — E1165 Type 2 diabetes mellitus with hyperglycemia: Secondary | ICD-10-CM | POA: Diagnosis not present

## 2018-12-09 ENCOUNTER — Encounter (HOSPITAL_COMMUNITY): Payer: Medicare Other

## 2018-12-15 DIAGNOSIS — I10 Essential (primary) hypertension: Secondary | ICD-10-CM | POA: Diagnosis not present

## 2018-12-15 DIAGNOSIS — E1165 Type 2 diabetes mellitus with hyperglycemia: Secondary | ICD-10-CM | POA: Diagnosis not present

## 2018-12-18 ENCOUNTER — Telehealth: Payer: Self-pay | Admitting: Cardiology

## 2018-12-18 NOTE — Telephone Encounter (Signed)
Cardiac Questionnaire:    Since your last visit or hospitalization:    1. Have you been having new or worsening chest pain? no   2. Have you been having new or worsening shortness of breath?no 3. Have you been having new or worsening leg swelling, wt gain, or increase in abdominal girth (pants fitting more tightly)? no   4. Have you had any passing out spells? no    *A YES to any of these questions would result in the appointment being kept. *If all the answers to these questions are NO, we should indicate that given the current situation regarding the worldwide coronarvirus pandemic, at the recommendation of the CDC, we are looking to limit gatherings in our waiting area, and thus will reschedule their appointment beyond four weeks from today.   _____________   TDVVO-16 Pre-Screening Questions:   Do you currently have a fever? no  Have you recently travelled on a cruise, internationally, or to [place], [place], [place], [place], or [place], [place]) ? no  Have you been in contact with someone that is currently pending confirmation of Covid19 testing or has been confirmed to have the [place] virus?  no  Are you currently experiencing fatigue or cough? No  YOUR CARDIOLOGY TEAM HAS ARRANGED FOR AN E-VISIT FOR YOUR APPOINTMENT - PLEASE REVIEW IMPORTANT INFORMATION BELOW SEVERAL DAYS PRIOR TO YOUR APPOINTMENT  Due to the recent COVID-19 pandemic, we are transitioning in-person office visits to tele-medicine visits in an effort to decrease unnecessary exposure to our patients and staff. Medicare and most insurances are covering these visits without a copay needed. We also encourage you to sign up for MyChart if you have not already done so. You will need a smartphone if possible. For patients that do not have this, we can still complete the visit using a regular telephone but do prefer a smartphone to enable video when possible. You may have a close family member that lives with you that can  help. If possible, we also ask that you have a blood pressure cuff and scale at home to measure your blood pressure, heart rate and weight prior to your scheduled appointment. Patients with clinical needs that need an in-person evaluation and testing will still be able to come to the office if absolutely necessary. If you have any questions, feel free to call our office.  CONSENT FOR TELE-HEALTH VISIT - PLEASE REVIEW  I hereby voluntarily request, consent and authorize [facility] and its employed or contracted physicians, physician assistants, nurse practitioners or other licensed health care professionals (the Practitioner), to provide me with telemedicine health care services (the Services") as deemed necessary by the treating Practitioner. I acknowledge and consent to receive the Services by the Practitioner via telemedicine. I understand that the telemedicine visit will involve communicating with the Practitioner through live audiovisual communication technology and the disclosure of certain medical information by electronic transmission. I acknowledge that I have been given the opportunity to request an in-person assessment or other available alternative prior to the telemedicine visit and am voluntarily participating in the telemedicine visit.  I understand that I have the right to withhold or withdraw my consent to the use of telemedicine in the course of my care at any time, without affecting my right to future care or treatment, and that the Practitioner or I may terminate the telemedicine visit at any time. I understand that I have the right to inspect all information obtained and/or recorded in the course of the  telemedicine visit and may receive copies of available information for a reasonable fee.  I understand that some of the potential risks of receiving the Services via telemedicine include:   Delay or interruption in medical evaluation due to technological equipment failure or  disruption;  Information transmitted may not be sufficient (e.g. poor resolution of images) to allow for appropriate medical decision making by the Practitioner; and/or   In rare instances, security protocols could fail, causing a breach of personal health information.  Furthermore, I acknowledge that it is my responsibility to provide information about my medical history, conditions and care that is complete and accurate to the best of my ability. I acknowledge that Practitioner's advice, recommendations, and/or decision may be based on factors not within their control, such as incomplete or inaccurate data provided by me or distortions of diagnostic images or specimens that may result from electronic transmissions. I understand that the practice of medicine is not an exact science and that Practitioner makes no warranties or guarantees regarding treatment outcomes. I acknowledge that I will receive a copy of this consent concurrently upon execution via email to the email address I last provided but may also request a printed copy by calling the office of [facility].    I understand that my insurance will be billed for this visit.   I have read or had this consent read to me.  I understand the contents of this consent, which adequately explains the benefits and risks of the Services being provided via telemedicine.   I have been provided ample opportunity to ask questions regarding this consent and the Services and have had my questions answered to my satisfaction.  I give my informed consent for the services to be provided through the use of telemedicine in my medical care  By participating in this telemedicine visit I agree to the above.  Patient gives consent for televisit 12/18/2018 pp

## 2018-12-29 ENCOUNTER — Ambulatory Visit (INDEPENDENT_AMBULATORY_CARE_PROVIDER_SITE_OTHER): Payer: Medicare Other | Admitting: *Deleted

## 2018-12-29 ENCOUNTER — Other Ambulatory Visit: Payer: Self-pay

## 2018-12-29 DIAGNOSIS — I255 Ischemic cardiomyopathy: Secondary | ICD-10-CM

## 2018-12-30 LAB — CUP PACEART REMOTE DEVICE CHECK
Battery Remaining Longevity: 34 mo
Battery Voltage: 2.95 V
Brady Statistic AP VP Percent: 73.61 %
Brady Statistic AP VS Percent: 26.12 %
Brady Statistic AS VP Percent: 0 %
Brady Statistic AS VS Percent: 0.27 %
Brady Statistic RA Percent Paced: 99.72 %
Brady Statistic RV Percent Paced: 73.61 %
Date Time Interrogation Session: 20200415041804
HighPow Impedance: 39 Ohm
HighPow Impedance: 47 Ohm
Implantable Lead Implant Date: 20091013
Implantable Lead Implant Date: 20091013
Implantable Lead Location: 753859
Implantable Lead Location: 753860
Implantable Lead Model: 4076
Implantable Lead Model: 6947
Implantable Pulse Generator Implant Date: 20150521
Lead Channel Impedance Value: 323 Ohm
Lead Channel Impedance Value: 342 Ohm
Lead Channel Impedance Value: 437 Ohm
Lead Channel Pacing Threshold Amplitude: 0.5 V
Lead Channel Pacing Threshold Amplitude: 0.75 V
Lead Channel Pacing Threshold Pulse Width: 0.4 ms
Lead Channel Pacing Threshold Pulse Width: 0.4 ms
Lead Channel Sensing Intrinsic Amplitude: 16.375 mV
Lead Channel Sensing Intrinsic Amplitude: 16.375 mV
Lead Channel Sensing Intrinsic Amplitude: 3.375 mV
Lead Channel Sensing Intrinsic Amplitude: 3.375 mV
Lead Channel Setting Pacing Amplitude: 1.5 V
Lead Channel Setting Pacing Amplitude: 2 V
Lead Channel Setting Pacing Pulse Width: 0.4 ms
Lead Channel Setting Sensing Sensitivity: 0.3 mV

## 2018-12-31 ENCOUNTER — Telehealth (INDEPENDENT_AMBULATORY_CARE_PROVIDER_SITE_OTHER): Payer: Medicare Other | Admitting: Cardiology

## 2018-12-31 ENCOUNTER — Other Ambulatory Visit: Payer: Self-pay

## 2018-12-31 ENCOUNTER — Encounter: Payer: Self-pay | Admitting: Cardiology

## 2018-12-31 VITALS — BP 115/60 | Ht 65.0 in | Wt 182.0 lb

## 2018-12-31 DIAGNOSIS — I472 Ventricular tachycardia, unspecified: Secondary | ICD-10-CM

## 2018-12-31 DIAGNOSIS — I130 Hypertensive heart and chronic kidney disease with heart failure and stage 1 through stage 4 chronic kidney disease, or unspecified chronic kidney disease: Secondary | ICD-10-CM

## 2018-12-31 DIAGNOSIS — I25118 Atherosclerotic heart disease of native coronary artery with other forms of angina pectoris: Secondary | ICD-10-CM

## 2018-12-31 DIAGNOSIS — I5022 Chronic systolic (congestive) heart failure: Secondary | ICD-10-CM

## 2018-12-31 DIAGNOSIS — N184 Chronic kidney disease, stage 4 (severe): Secondary | ICD-10-CM

## 2018-12-31 DIAGNOSIS — Z7189 Other specified counseling: Secondary | ICD-10-CM

## 2018-12-31 DIAGNOSIS — E7800 Pure hypercholesterolemia, unspecified: Secondary | ICD-10-CM

## 2018-12-31 DIAGNOSIS — Z79899 Other long term (current) drug therapy: Secondary | ICD-10-CM

## 2018-12-31 DIAGNOSIS — Z95810 Presence of automatic (implantable) cardiac defibrillator: Secondary | ICD-10-CM

## 2018-12-31 MED ORDER — NITROGLYCERIN 0.4 MG/SPRAY TL SOLN: 1.0000 | 12 refills | Status: DC | PRN

## 2018-12-31 NOTE — Progress Notes (Signed)
Virtual Visit via Video Note   This visit type was conducted due to national recommendations for restrictions regarding the COVID-19 Pandemic (e.g. social distancing) in an effort to limit this patient's exposure and mitigate transmission in our community.  Due to her co-morbid illnesses, this patient is at least at moderate risk for complications without adequate follow up.  This format is felt to be most appropriate for this patient at this time.  All issues noted in this document were discussed and addressed.  A limited physical exam was performed with this format.  Please refer to the patient's chart for her consent to telehealth for [facility].   Evaluation Performed:  Follow-up visit  Date:  12/31/2018   ID:  Jenna West, DOB 1946-03-22, MRN [id number]  Patient Location: Home Provider Location: Office  PCP:  Ronita Hipps, MD  Cardiologist:  No primary care provider on file. Dr Bettina Gavia Electrophysiologist:  Will Meredith Leeds, MD   Chief Complaint: Follow-up  heart disease  History of Present Illness:    Jenna West is a 73 y.o. 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  She did well until Tuesday her dog got loose she put a leash on him was coming in tripped over the dog and fell face first into a gait.  She did not lose consciousness does not have headache.  She was very upset frightened struggled to get the dog in the home and became severely short of breath.  Appropriately she took a nitroglycerin with good quick relief and never had chest pain.  Her back is sore and she has prominent ecchymosis about the eyes bilaterally no nasal deformity or pain she has an ecchymosis over the scalp but no headache or loss of consciousness.  Outside of this event she had done well she has had no palpitation or syncope  no edema shortness of breath orthopnea.  Her weight is been stable at home and recent device check showed no VT VF therapy.  She is restricting herself to home wears a mask outdoors practices social distancing and has good handwashing and hand sanitation allergen technique  The patient does not have symptoms concerning for COVID-19 infection (fever, chills, cough, or new shortness of breath).    Past Medical History:  Diagnosis Date  . Acquired hypothyroidism 05/01/2015  . Angina pectoris ([place]) 07/13/2015  . Aspirin long-term use 03/07/2016  . Benign essential hypertension 05/01/2015  . Bilateral carotid artery stenosis 03/07/2016  . Cardiomyopathy ([place]) 05/01/2015   Overview:  EF 20% with aneurysm of the apex  . Carotid artery stenosis 06/21/2015  . Coronary arteriosclerosis in native artery 05/01/2015   Overview:  Overview:  PCI and stent to LAD and RCA 2004 for MI DES to LAD for subtotal occlusion with no reflow 8/09  . Coronary artery disease of native artery of native heart with stable angina pectoris ([place]) 05/01/2015   Overview:  PCI and stent to LAD and RCA 2004 for MI DES to LAD for subtotal occlusion with no reflow 2009  . ICD (implantable cardioverter-defibrillator) in place 05/01/2015   Overview:  [employer]  . Left heart failure ([place]) 05/01/2015  . MI (myocardial infarction) ([place]) 05/01/2015  . On amiodarone therapy 08/15/2016  . Pure hypercholesterolemia 05/01/2015  . Stage 3 chronic kidney disease (  [place]) 12/18/2016  . Syncope and collapse 05/01/2015  . Type 2 diabetes mellitus with stage 3 chronic kidney disease, with long-term current use of insulin ([place]) 07/17/2016  . VT (ventricular tachycardia) ([place]) 05/01/2015   Past Surgical History:  Procedure Laterality Date  . ABDOMINAL HYSTERECTOMY    . ANKLE FRACTURE SURGERY Bilateral    6 months apart, screws  . CARDIAC CATHETERIZATION     2003, 2009, 2016  . CAROTID STENT  06/21/2015   thoracic ao, 4 vessel cerebral arteriogram & rt  internal carotid artery pta/stent CPT 53976  . CATARACT EXTRACTION Right    With lens replaced  . COLONOSCOPY  2013  . CORONARY STENT PLACEMENT    . PACEMAKER PLACEMENT     has two, one working and one not, could not remove old one, Dr. Elonda Husky      Current Meds  Medication Sig  . amiodarone (PACERONE) 200 MG tablet TAKE ONE-HALF TABLET AS  DIRECTEDT ON MONDAY,  WEDNESDAY, AND FRIDAY  . aspirin EC 81 MG tablet Take 81 mg by mouth daily.  Marland Kitchen atorvastatin (LIPITOR) 80 MG tablet Take 80 mg by mouth daily.  . carvedilol (COREG) 12.5 MG tablet Take 0.5 tablets (6.25 mg total) by mouth 2 (two) times daily.  . clopidogrel (PLAVIX) 75 MG tablet Take 75 mg by mouth daily.  . furosemide (LASIX) 20 MG tablet Take 20 mg by mouth daily as needed.  Marland Kitchen HUMALOG 100 UNIT/ML injection Inject 25 units in the am, 25 units at lunch, and 25 units at supper  . LANTUS SOLOSTAR 100 UNIT/ML Solostar Pen Inject 12 units at bedtime  . levothyroxine (SYNTHROID, LEVOTHROID) 100 MCG tablet Take 100 mcg by mouth daily before breakfast.   . Multiple Vitamin (MULTI-VITAMINS) TABS Take 1 tablet by mouth daily.  . pantoprazole (PROTONIX) 40 MG tablet Take 40 mg by mouth daily as needed.  . ranolazine (RANEXA) 1000 MG SR tablet Take 1,000 mg by mouth 2 (two) times daily.  . sacubitril-valsartan (ENTRESTO) 24-26 MG Take 1 tablet by mouth 2 (two) times daily.  . sertraline (ZOLOFT) 50 MG tablet Take 50 mg by mouth daily.  Marland Kitchen spironolactone (ALDACTONE) 25 MG tablet Take 12.5 mg by mouth daily.  Marland Kitchen zolpidem (AMBIEN CR) 12.5 MG CR tablet Take 12.5 mg by mouth at bedtime.     Allergies:   Shellfish allergy and Sulfa antibiotics   Social History   Tobacco Use  . Smoking status: Never Smoker  . Smokeless tobacco: Never Used  Substance Use Topics  . Alcohol use: No  . Drug use: No     Family Hx: The patient's family history includes Brain cancer in her mother; Breast cancer in her sister; Diabetes in her sister; Heart attack  in her father; Heart disease in her father; Rectal cancer in her daughter.  ROS:   Please see the history of present illness.     All other systems reviewed and are negative.   Prior CV studies:   The following studies were reviewed today:  ICD check yesterday reviewed by me, report:Conclusion  Normal remote reviewed. 12 beat NSVT x1 Next remote 91 days.     Labs/Other Tests and Data Reviewed:    EKG:  No ECG reviewed.  Recent Labs:  Most recent labs 11/24/2018 CMP was normal except for creatinine 1.8 GFR 27 cc/min sodium 136 potassium 4.3 liver function was normal TSH 6.376 lipid profile cholesterol 123 HDL 34 LDL 67 05/27/2018: ALT 14; BUN 33; Creatinine, Ser 2.01; NT-Pro  BNP 3,759; Potassium 4.7; Sodium 135   Recent Lipid Panel Lab Results  Component Value Date/Time   CHOL 131 05/27/2018 12:00 AM   TRIG 139 05/27/2018 12:00 AM   HDL 41 05/27/2018 12:00 AM   CHOLHDL 3.2 05/27/2018 12:00 AM   LDLCALC 62 05/27/2018 12:00 AM    Wt Readings from Last 3 Encounters:  12/31/18 182 lb (82.6 kg)  09/02/18 188 lb 6 oz (85.4 kg)  07/07/18 188 lb (85.3 kg)     Objective:    Vital Signs:  BP 115/60 (BP Location: Right Arm, Patient Position: Standing)   Ht 5\' 5"  (1.651 m)   Wt 182 lb (82.6 kg)   BMI 30.29 kg/m   She tripped over the dog fell on Tuesday struck her face has periorbital ecchymosis has an ecchymosis over her left scalp Constitutional, well-nourished well-developed in no acute distress Vital signs reviewed Eyes, conjunctiva and sclera are normal without pallor or icterus extraocular motions intact and normal there is no lid lag Respiratory, normal effort and excursion no audible wheezing without a stethoscope Cardiovascular, no neck vein distention or peripheral edema Skin, no rash skin lesion or ulceration of the extremities Neurologic, cranial nerves II to XII are grossly intact and the patient moves all 4 extremities Neuro/Psychiatric, judgment and thought  processes are intact and coherent, alert and oriented x3, mood and affect appear normal.  ASSESSMENT & PLAN:    1. Heart failure chronic systolic stable New York Heart Association class I.  She has done very well with a complex medical treatment including loop diuretic MRA maximally tolerated dose of carvedilol and Entresto limited by hypotension.  Her weight is stable she has good healthcare literacy self-management restrict sodium and continue her current multidrug regimen. 2. Hypertensive heart and chronic kidney disease stable if anything her renal function is somewhat improved on recent labs she tolerates MRA and Entresto without hyperkalemia. 3. CAD stable continue medical therapy including aspirin beta-blocker ranolazine and her statin.  She has had no angina New York Heart Association class I although I think the episode of severe shortness of breath with her fall was anginal in nature and I would continue her current medical treatment will renew her nitroglycerin 4. Ventricular tachycardia stable continue amiodarone 5. Amiodarone therapy stable recent labs showed no evidence of liver thyroid toxicity next visit we will do a chest x-ray check amiodarone level 6. Hyperlipidemia stable continue her statin lipids are ideal at target 7. ICD stable device check yesterday reviewed her episode of fall was not VT VF therapy 8. COVID-19 stable reinforced social distancing wearing a cloth mask when outdoors handwashing and hand sanitation technique  COVID-19 Education: The signs and symptoms of COVID-19 were discussed with the patient and how to seek care for testing (follow up with PCP or arrange E-visit).  The importance of social distancing was discussed today.  Time:   Today, I have spent 30 minutes with the patient with telehealth technology discussing the above problems.     Medication Adjustments/Labs and Tests Ordered: Current medicines are reviewed at length with the patient today.   Concerns regarding medicines are outlined above.   Tests Ordered: No orders of the defined types were placed in this encounter.   Medication Changes: No orders of the defined types were placed in this encounter.   Disposition:  Follow up August 2020  SignedShirlee More, MD  12/31/2018 9:04 AM    [place]

## 2018-12-31 NOTE — Patient Instructions (Signed)
Medication Instructions:  Your physician has recommended you make the following change in your medication:  START: Nitrogylcerine spray - 1spray under the tongue every 1min x 3 sprays  If you need a refill on your cardiac medications before your next appointment, please call your pharmacy.   Lab work: None   If you have labs (blood work) drawn today and your tests are completely normal, you will receive your results only by: Marland Kitchen MyChart Message (if you have MyChart) OR . A paper copy in the mail If you have any lab test that is abnormal or we need to change your treatment, we will call you to review the results.  Testing/Procedures: None  Follow-Up: At [facility], you and your health needs are our priority.  As part of our continuing mission to provide you with exceptional heart care, we have created designated Provider Care Teams.  These Care Teams include your primary Cardiologist (physician) and Advanced Practice Providers (APPs -  Physician Assistants and Nurse Practitioners) who all work together to provide you with the care you need, when you need it. You will need a follow up appointment in 5 months.  Any Other Special Instructions Will Be Listed Below (If Applicable).  Nitroglycerin mouth spray What is this medicine? NITROGLYCERIN (nye troe GLI ser in) is a type of vasodilator. It relaxes blood vessels, increasing the blood and oxygen supply to your heart. This medicine is used to prevent or relieve chest pain caused by angina. This medicine may be used for other purposes; ask your health care provider or pharmacist if you have questions. COMMON BRAND NAME(S): Nitrolingual, NitroMist What should I tell my health care provider before I take this medicine? They need to know if you have any of these conditions: -liver disease -recent heart attack -an unusual or allergic reaction to nitroglycerin, other medicines, foods, dyes, or preservatives -pregnant or trying to get  pregnant -breast-feeding How should I use this medicine? This medicine is only for use in the mouth. Use at the first sign of an attack. You can also use this medicine 5 to 10 minutes before an event likely to produce chest pain. Follow the directions on the prescription label. Do not shake the container. Remove the plastic cover. Before using this medicine for the first time, you must prime the bottle by spraying the pump 5 times into the air away from yourself. If this medicine has not been used for 6 weeks, you must reprime the bottle by spraying once into the air away from yourself. Hold the container upright and spray either onto or underneath the tongue. Do not breathe in the spray. After each spray, close your mouth but do not swallow or rinse. Your symptoms should improve in 1 to 5 minutes. You can repeat the dose every 5 minutes for up to three doses. If you do not feel better after 1 dose, contact your doctor or health care professional immediately or have someone take you straight to an emergency room. Do not take your medicine more often than directed. If you take this medicine often to relieve symptoms of angina, your doctor or health care professional may provide you with different instructions to manage your symptoms. If symptoms do not go away after following these instructions, it is important to call 9-1-1 immediately. Do not take more than 3 doses over 15 minutes. Talk to your pediatrician regarding the use of this medicine in children. Special care may be needed. Overdosage: If you think you have taken  too much of this medicine contact a poison control center or emergency room at once. NOTE: This medicine is only for you. Do not share this medicine with others. What if I miss a dose? This does not apply. This medicine is only used as needed. What may interact with this medicine? Do not take this medicine with any of the following medications: -certain migraine medicines like  ergotamine and dihydroergotamine (DHE) -medicines used to treat erectile dysfunction like sildenafil, tadalafil, and vardenafil -riociguat This medicine may also interact with the following medications: -medicines for high blood pressure This list may not describe all possible interactions. Give your health care provider a list of all the medicines, herbs, non-prescription drugs, or dietary supplements you use. Also tell them if you smoke, drink alcohol, or use illegal drugs. Some items may interact with your medicine. What should I watch for while using this medicine? Tell your doctor or health care professional if you feel your medicine is no longer working. Keep this medicine with you at all times. Sit or lie down when you take your medicine to prevent falling if you feel dizzy or faint after using it. Try to remain calm. This will help you to feel better faster. If you feel dizzy, take several deep breaths and lie down with your feet propped up, or bend forward with your head resting between your knees. You may get drowsy or dizzy. Do not drive, use machinery, or do anything that needs mental alertness until you know how this drug affects you. Do not stand or sit up quickly, especially if you are an older patient. This reduces the risk of dizzy or fainting spells. Alcohol can make you more drowsy and dizzy. Avoid alcoholic drinks. Do not treat yourself for coughs, colds, or pain while you are taking this medicine without asking your doctor or health care professional for advice. Some ingredients may increase your blood pressure. What side effects may I notice from receiving this medicine? Side effects that you should report to your doctor or health care professional as soon as possible: -blurred vision -dry mouth -skin rash -sweating -the feeling of extreme pressure in the head -unusually weak or tired Side effects that usually do not require medical attention (report to your doctor or health  care professional if they continue or are bothersome): -flushing of the face or neck -headache -irregular heartbeat, palpitations -nausea, vomiting This list may not describe all possible side effects. Call your doctor for medical advice about side effects. You may report side effects to FDA at 1-800-FDA-1088. Where should I keep my medicine? Keep out of the reach of children. Store at room temperature between 15 and 30 degrees C (59 and 86 degrees F). Do not spray near flames. Do not forcefully open or burn the container. Throw away any unused medicine after the expiration date. NOTE: This sheet is a summary. It may not cover all possible information. If you have questions about this medicine, talk to your doctor, pharmacist, or health care provider.  2019 Elsevier/Gold Standard (2013-06-24 10:26:21)

## 2019-01-05 ENCOUNTER — Encounter (HOSPITAL_COMMUNITY): Payer: Medicare Other

## 2019-01-05 ENCOUNTER — Encounter: Payer: Self-pay | Admitting: Cardiology

## 2019-01-05 NOTE — Progress Notes (Signed)
Remote ICD transmission.   

## 2019-01-14 DIAGNOSIS — I10 Essential (primary) hypertension: Secondary | ICD-10-CM | POA: Diagnosis not present

## 2019-01-14 DIAGNOSIS — E1165 Type 2 diabetes mellitus with hyperglycemia: Secondary | ICD-10-CM | POA: Diagnosis not present

## 2019-02-16 ENCOUNTER — Other Ambulatory Visit: Payer: Self-pay

## 2019-02-16 ENCOUNTER — Other Ambulatory Visit: Payer: Self-pay | Admitting: Cardiovascular Disease

## 2019-02-16 ENCOUNTER — Ambulatory Visit (HOSPITAL_COMMUNITY)
Admission: RE | Admit: 2019-02-16 | Discharge: 2019-02-16 | Disposition: A | Discharge status: Home / Self Care | Payer: Medicare Other | Source: Ambulatory Visit | Attending: Cardiology | Admitting: Cardiology

## 2019-02-16 DIAGNOSIS — I6529 Occlusion and stenosis of unspecified carotid artery: Secondary | ICD-10-CM

## 2019-02-18 ENCOUNTER — Telehealth: Payer: Self-pay

## 2019-02-18 DIAGNOSIS — I6523 Occlusion and stenosis of bilateral carotid arteries: Secondary | ICD-10-CM

## 2019-02-18 NOTE — Telephone Encounter (Signed)
Pt made aware of carotid dopp results and Dr.Arida's recommendation. Pt verbalized understanding.  Order in [place] for repeat 1 yr carotid.

## 2019-02-18 NOTE — Telephone Encounter (Signed)
-----   Message from Wellington Hampshire, MD sent at 02/18/2019 11:39 AM EDT ----- Inform patient that carotid Doppler showed patent right carotid stent.  Repeat study in 1 year

## 2019-03-16 DIAGNOSIS — E039 Hypothyroidism, unspecified: Secondary | ICD-10-CM | POA: Diagnosis not present

## 2019-03-16 DIAGNOSIS — I10 Essential (primary) hypertension: Secondary | ICD-10-CM | POA: Diagnosis not present

## 2019-03-16 DIAGNOSIS — E1165 Type 2 diabetes mellitus with hyperglycemia: Secondary | ICD-10-CM | POA: Diagnosis not present

## 2019-03-30 ENCOUNTER — Encounter: Payer: Medicare Other | Admitting: *Deleted

## 2019-03-31 ENCOUNTER — Telehealth: Payer: Self-pay

## 2019-03-31 NOTE — Telephone Encounter (Signed)
Left message for patient to remind of missed remote transmission.  

## 2019-04-04 ENCOUNTER — Other Ambulatory Visit: Payer: Self-pay | Admitting: Cardiology

## 2019-04-04 IMAGING — NM NM MISC PROCEDURE
12 series · 60 of 60 positions shown · non-contrast
Comparison: none

[Series 1: wbr_r-proj_st rest · 6.40mm/px · 5 of 64 frames shown (1 of 2)]
[frame 6/64]
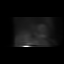
[frame 16/64]
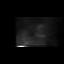
[frame 27/64]
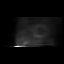
[frame 48/64]
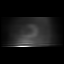
[frame 59/64]
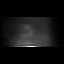

[Series 1: rest · 6.40mm/px · 5 of 64 frames shown (1 of 2)]
[frame 6/64]
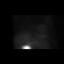
[frame 16/64]
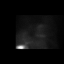
[frame 27/64]
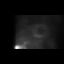
[frame 48/64]
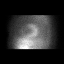
[frame 59/64]
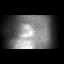

[Series 1: wbr_s-proj_st stress-gsp · 6.40mm/px · 5 of 512 frames shown (1 of 2)]
[frame 43/512]
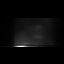
[frame 128/512]
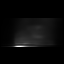
[frame 214/512]
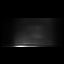
[frame 384/512]
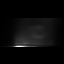
[frame 470/512]
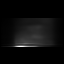

[Series 1: wbr_r-proj_st rest · 6.40mm/px · 5 of 64 frames shown (2 of 2)]
[frame 6/64]
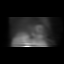
[frame 16/64]
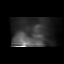
[frame 27/64]
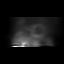
[frame 48/64]
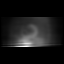
[frame 59/64]
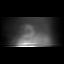

[Series 1: wbr_s-proj_st stress-sum-em · 6.40mm/px · 5 of 64 frames shown (1 of 2)]
[frame 6/64]
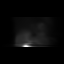
[frame 16/64]
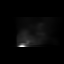
[frame 27/64]
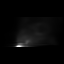
[frame 48/64]
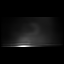
[frame 59/64]
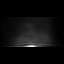

[Series 1: stress-gsp · 6.40mm/px · 5 of 512 frames shown (1 of 2)]
[frame 43/512]
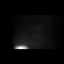
[frame 128/512]
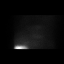
[frame 214/512]
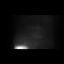
[frame 384/512]
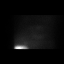
[frame 470/512]
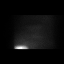

[Series 1: stress-sum-em · 6.40mm/px · 5 of 64 frames shown (1 of 2)]
[frame 6/64]
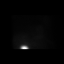
[frame 16/64]
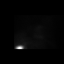
[frame 38/64]
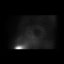
[frame 48/64]
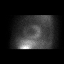
[frame 59/64]
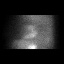

[Series 1: wbr_s-proj_st stress-gsp · 6.40mm/px · 5 of 512 frames shown (2 of 2)]
[frame 43/512]
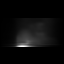
[frame 128/512]
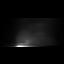
[frame 299/512]
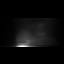
[frame 384/512]
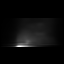
[frame 470/512]
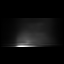

[Series 1: rest · 6.40mm/px · 5 of 64 frames shown (2 of 2)]
[frame 6/64]
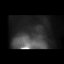
[frame 16/64]
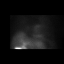
[frame 38/64]
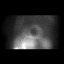
[frame 48/64]
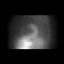
[frame 59/64]
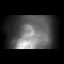

[Series 1: stress-sum-em · 6.40mm/px · 5 of 64 frames shown (2 of 2)]
[frame 6/64]
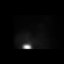
[frame 16/64]
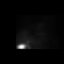
[frame 38/64]
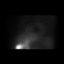
[frame 48/64]
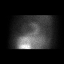
[frame 59/64]
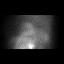

[Series 1: wbr_s-proj_st stress-sum-em · 6.40mm/px · 5 of 64 frames shown (2 of 2)]
[frame 6/64]
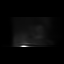
[frame 16/64]
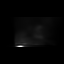
[frame 38/64]
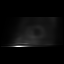
[frame 48/64]
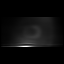
[frame 59/64]
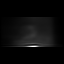

[Series 1: stress-gsp · 6.40mm/px · 5 of 512 frames shown (2 of 2)]
[frame 43/512]
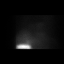
[frame 128/512]
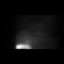
[frame 299/512]
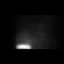
[frame 384/512]
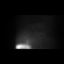
[frame 470/512]
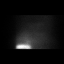

[60 of 60 positions shown; findings below may reference images not displayed]

Canned report from images found in remote index.

Refer to host system for actual result text.

## 2019-04-12 DIAGNOSIS — E039 Hypothyroidism, unspecified: Secondary | ICD-10-CM | POA: Diagnosis not present

## 2019-04-12 DIAGNOSIS — Z0000 Encounter for general adult medical examination without abnormal findings: Secondary | ICD-10-CM | POA: Diagnosis not present

## 2019-04-12 DIAGNOSIS — E1165 Type 2 diabetes mellitus with hyperglycemia: Secondary | ICD-10-CM | POA: Diagnosis not present

## 2019-04-12 DIAGNOSIS — Z79899 Other long term (current) drug therapy: Secondary | ICD-10-CM | POA: Diagnosis not present

## 2019-04-20 ENCOUNTER — Ambulatory Visit (INDEPENDENT_AMBULATORY_CARE_PROVIDER_SITE_OTHER): Payer: Medicare Other | Admitting: *Deleted

## 2019-04-20 DIAGNOSIS — I255 Ischemic cardiomyopathy: Secondary | ICD-10-CM | POA: Diagnosis not present

## 2019-04-20 LAB — CUP PACEART REMOTE DEVICE CHECK
Battery Remaining Longevity: 29 mo
Battery Voltage: 2.94 V
Brady Statistic AP VP Percent: 63.18 %
Brady Statistic AP VS Percent: 36.34 %
Brady Statistic AS VP Percent: 0 %
Brady Statistic AS VS Percent: 0.48 %
Brady Statistic RA Percent Paced: 99.5 %
Brady Statistic RV Percent Paced: 63.18 %
Date Time Interrogation Session: 20200804152306
HighPow Impedance: 36 Ohm
HighPow Impedance: 44 Ohm
Implantable Lead Implant Date: 20091013
Implantable Lead Implant Date: 20091013
Implantable Lead Location: 753859
Implantable Lead Location: 753860
Implantable Lead Model: 4076
Implantable Lead Model: 6947
Implantable Pulse Generator Implant Date: 20150521
Lead Channel Impedance Value: 323 Ohm
Lead Channel Impedance Value: 323 Ohm
Lead Channel Impedance Value: 399 Ohm
Lead Channel Pacing Threshold Amplitude: 0.625 V
Lead Channel Pacing Threshold Amplitude: 1 V
Lead Channel Pacing Threshold Pulse Width: 0.4 ms
Lead Channel Pacing Threshold Pulse Width: 0.4 ms
Lead Channel Sensing Intrinsic Amplitude: 16.125 mV
Lead Channel Sensing Intrinsic Amplitude: 16.125 mV
Lead Channel Sensing Intrinsic Amplitude: 2.5 mV
Lead Channel Sensing Intrinsic Amplitude: 2.5 mV
Lead Channel Setting Pacing Amplitude: 1.5 V
Lead Channel Setting Pacing Amplitude: 2 V
Lead Channel Setting Pacing Pulse Width: 0.4 ms
Lead Channel Setting Sensing Sensitivity: 0.3 mV

## 2019-04-22 ENCOUNTER — Other Ambulatory Visit: Payer: Self-pay | Admitting: *Deleted

## 2019-04-22 MED ORDER — ATORVASTATIN CALCIUM 80 MG PO TABS: 80.0000 mg | ORAL_TABLET | Freq: Every day | ORAL | 0 refills | Status: DC

## 2019-04-22 MED ORDER — CLOPIDOGREL BISULFATE 75 MG PO TABS: 75.0000 mg | ORAL_TABLET | Freq: Every day | ORAL | 0 refills | Status: DC

## 2019-04-22 MED ORDER — SPIRONOLACTONE 25 MG PO TABS: 12.5000 mg | ORAL_TABLET | Freq: Every day | ORAL | 0 refills | Status: DC

## 2019-04-27 ENCOUNTER — Encounter: Payer: Self-pay | Admitting: Cardiology

## 2019-04-27 NOTE — Progress Notes (Signed)
Remote ICD transmission.   

## 2019-05-12 ENCOUNTER — Telehealth: Payer: Medicare Other | Admitting: Cardiology

## 2019-05-18 ENCOUNTER — Telehealth: Payer: Self-pay | Admitting: Cardiology

## 2019-05-18 NOTE — Telephone Encounter (Signed)
° °  Patient fell April 2, states she feels device has shifted    1. Has your device fired? NO  2. Is you device beeping? NO  3. Are you experiencing draining or swelling at device site? NO  4. Are you calling to see if we received your device transmission? NO  5. Have you passed out? NO    Please route to Jenna West

## 2019-05-19 NOTE — Telephone Encounter (Signed)
Spoke with patient. Pt reports that back in April, she was pulled into the fence by her dog and hit her head. She lost consciousness for a few minutes and woke up with a "goose egg" on her head. She did not seek emergency medical attention and has not followed up with PCP. Pt reports that she still has a knot on her head, but facial ecchymosis has resolved. Some dizzy spells with position changes, on and off for a few years, stable once she gets her balance. No headaches. Some ShOB that seems to be lingering over the past few weeks.  Pt reports that her ICD feels more prominent in her chest since her fall in April. She isn't sure if she's lost weight, but feels device position may have changed slightly. No signs/symptoms of infection. She is unable to send a picture. Offered DC appointment to assess site--pt reports she doesn't think this is necessary, just wanted to make sure device is functioning appropriately. Advised that transmission on 8/4 shows stable device function, lead trends stable. Advised to f/u with PCP regarding knot that is still present on her head. Requested manual transmission to reassess OptiVol to ensure ShOB is not fluid-related. Pt in agreement with plan, agrees to send a transmission today.

## 2019-05-19 NOTE — Telephone Encounter (Signed)
Spoke w/ pt and she stated that she has tried to send the transmission w/ the home monitor. She stated that her monitor will not find cell signal she has tried to move the monitor but it has been unsuccessful. She stated that she will try again tomorrow that this monitor does this from time to time and tends to work later on. She will call the office if this problem does not resolve.

## 2019-05-20 NOTE — Telephone Encounter (Signed)
LMOVM requesting call back from patient. Gave direct number for return call.  Transmission received. OptiVol suggests fluid accumulation since ~05/05/19. Presenting rhythm Ap/Vp @ 85bpm. 2 VT-NS episodes, episode on 05/07/19 falls below detection, see EGM below, pt did not report any symptoms with this episode. Routed to Dr. Bettina Gavia for recommendations regarding fluid retention and associated ShOB. Routed to Dr. Curt Bears for review of VT-NS episode.

## 2019-05-21 ENCOUNTER — Telehealth: Payer: Self-pay

## 2019-05-21 NOTE — Telephone Encounter (Signed)
Left message on voicemail for patient to return call. 

## 2019-05-21 NOTE — Telephone Encounter (Signed)
Increase coreg to 12.5 mg BID for NSVT.

## 2019-05-21 NOTE — Telephone Encounter (Signed)
Called patient per Dr Joya Gaskins requested to see if patient has had an increase in her weight, increased shortness of breath, or edema.  Patient states that her weight is stable and she has not had any edema.  Patient states that she does have shortness of breath, but it has not worsened since last office visit with Dr Bettina Gavia.   Dr Bettina Gavia aware and does not wish to make any changes in medications at this time.  Patient advised to contact our office with any new or worsening symptoms.  Patient agreed to plan and verbalized understanding.  No further questions.

## 2019-05-25 MED ORDER — CARVEDILOL 12.5 MG PO TABS: 12.5000 mg | ORAL_TABLET | Freq: Two times a day (BID) | ORAL | 1 refills | Status: DC

## 2019-05-25 NOTE — Telephone Encounter (Signed)
Pt advised to increase Carvedilol to 12.5 mg BID. Patient verbalized understanding and agreeable to plan.  Yearly f/u scheduled for October in [place]

## 2019-05-26 ENCOUNTER — Other Ambulatory Visit: Payer: Self-pay | Admitting: Cardiology

## 2019-06-10 ENCOUNTER — Other Ambulatory Visit: Payer: Self-pay | Admitting: Cardiology

## 2019-06-16 DIAGNOSIS — I10 Essential (primary) hypertension: Secondary | ICD-10-CM | POA: Diagnosis not present

## 2019-06-16 DIAGNOSIS — E1165 Type 2 diabetes mellitus with hyperglycemia: Secondary | ICD-10-CM | POA: Diagnosis not present

## 2019-06-22 DIAGNOSIS — Z23 Encounter for immunization: Secondary | ICD-10-CM | POA: Diagnosis not present

## 2019-06-28 ENCOUNTER — Encounter: Payer: Self-pay | Admitting: Cardiology

## 2019-06-28 ENCOUNTER — Other Ambulatory Visit: Payer: Self-pay

## 2019-06-28 ENCOUNTER — Ambulatory Visit (INDEPENDENT_AMBULATORY_CARE_PROVIDER_SITE_OTHER): Payer: Medicare Other | Admitting: Cardiology

## 2019-06-28 VITALS — BP 135/77 | HR 91 | Ht 65.0 in | Wt 190.6 lb

## 2019-06-28 DIAGNOSIS — Z95810 Presence of automatic (implantable) cardiac defibrillator: Secondary | ICD-10-CM

## 2019-06-28 DIAGNOSIS — I472 Ventricular tachycardia, unspecified: Secondary | ICD-10-CM

## 2019-06-28 DIAGNOSIS — I5022 Chronic systolic (congestive) heart failure: Secondary | ICD-10-CM

## 2019-06-28 DIAGNOSIS — I255 Ischemic cardiomyopathy: Secondary | ICD-10-CM

## 2019-06-28 LAB — CUP PACEART INCLINIC DEVICE CHECK
Battery Remaining Longevity: 29 mo
Battery Voltage: 2.94 V
Brady Statistic AP VP Percent: 64.62 %
Brady Statistic AP VS Percent: 34.96 %
Brady Statistic AS VP Percent: 0 %
Brady Statistic AS VS Percent: 0.42 %
Brady Statistic RA Percent Paced: 99.57 %
Brady Statistic RV Percent Paced: 64.62 %
Date Time Interrogation Session: 20201012140446
HighPow Impedance: 35 Ohm
HighPow Impedance: 43 Ohm
Implantable Lead Implant Date: 20091013
Implantable Lead Implant Date: 20091013
Implantable Lead Location: 753859
Implantable Lead Location: 753860
Implantable Lead Model: 4076
Implantable Lead Model: 6947
Implantable Pulse Generator Implant Date: 20150521
Lead Channel Impedance Value: 323 Ohm
Lead Channel Impedance Value: 342 Ohm
Lead Channel Impedance Value: 380 Ohm
Lead Channel Pacing Threshold Amplitude: 0.75 V
Lead Channel Pacing Threshold Amplitude: 1.25 V
Lead Channel Pacing Threshold Pulse Width: 0.4 ms
Lead Channel Pacing Threshold Pulse Width: 0.4 ms
Lead Channel Sensing Intrinsic Amplitude: 17.125 mV
Lead Channel Sensing Intrinsic Amplitude: 2.625 mV
Lead Channel Setting Pacing Amplitude: 1.5 V
Lead Channel Setting Pacing Amplitude: 2.25 V
Lead Channel Setting Pacing Pulse Width: 0.4 ms
Lead Channel Setting Sensing Sensitivity: 0.3 mV

## 2019-06-28 NOTE — Progress Notes (Signed)
Electrophysiology Office Note   Date:  06/28/2019   ID:  Jenna West, DOB September 13, 1946, MRN [id number]  PCP:  Ronita Hipps, MD  Cardiologist:  Bettina Gavia Primary Electrophysiologist:  Mayzee Reichenbach Meredith Leeds, MD    No chief complaint on file.    History of Present Illness: Jenna West is a 73 y.o. female who is being seen today for the evaluation of CHF at the request of Ronita Hipps, MD. Presenting today for electrophysiology evaluation. History of coronary disease status post PCI to the LAD and RCA in 2000 for an LAD in 2009 with severe LV dysfunction and an EF less than 20%, CHF, hypertension, VT on amiodarone with an ICD, stage IV CKG. She had a Myoview that showed extensive scar and LV dysfunction with an EF of 60%. EF in 2016 was 40%.  Today, denies symptoms of palpitations, chest pain, shortness of breath, orthopnea, PND, lower extremity edema, claudication, dizziness, presyncope, syncope, bleeding, or neurologic sequela. The patient is tolerating medications without difficulties.  She has done okay over the last year.  In April she had a fall that she was pulled by her dog.  She hit her head on the fence.  She is continuing to have intermittent headaches in that area.  She has follow-up with her primary physician coming up.  She is also had 2 episodes of syncope.  What appeared to be due to orthostasis as she had just gotten out of a hot tub, her blood sugar was low at the time.  Another she was at the hairdresser's.  She was in the car.  She does not remember much about the episode.   Past Medical History:  Diagnosis Date  . Acquired hypothyroidism 05/01/2015  . Angina pectoris ([place]) 07/13/2015  . Aspirin long-term use 03/07/2016  . Benign essential hypertension 05/01/2015  . Bilateral carotid artery stenosis 03/07/2016  . 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 Carotid artery stenosis 06/21/2015  . Coronary arteriosclerosis in native  artery 05/01/2015   Overview:  Overview:  PCI and stent to LAD and RCA 2004 for MI DES to LAD for subtotal occlusion with no reflow 8/09  . Coronary artery disease of native artery of native heart with stable angina pectoris ([place]) 05/01/2015   Overview:  PCI and stent to LAD and RCA 2004 for MI DES to LAD for subtotal occlusion with no reflow 2009  . ICD (implantable cardioverter-defibrillator) in place 05/01/2015   Overview:  Medtronic  . Left heart failure ([place]) 05/01/2015  . MI (myocardial infarction) ([place]) 05/01/2015  . On amiodarone therapy 08/15/2016  . Pure hypercholesterolemia 05/01/2015  . Stage 3 chronic kidney disease 12/18/2016  . Syncope and collapse 05/01/2015  . Type 2 diabetes mellitus with stage 3 chronic kidney disease, with long-term current use of insulin ([place]) 07/17/2016  . VT (ventricular tachycardia) ([place]) 05/01/2015   Past Surgical History:  Procedure Laterality Date  . ABDOMINAL HYSTERECTOMY    . ANKLE FRACTURE SURGERY Bilateral    6 months apart, screws  . CARDIAC CATHETERIZATION     2003, 2009, 2016  . CAROTID STENT  06/21/2015   thoracic ao, 4 vessel cerebral arteriogram & rt internal carotid artery pta/stent CPT 86767  . CATARACT EXTRACTION Right    With lens replaced  . COLONOSCOPY  2013  . CORONARY STENT PLACEMENT    . PACEMAKER PLACEMENT     has two, one working and one not, could  not remove old one, Dr. Elonda Husky      Current Outpatient Medications  Medication Sig Dispense Refill  . amiodarone (PACERONE) 200 MG tablet TAKE ONE-HALF TABLET AS  DIRECTEDT ON MONDAY,  WEDNESDAY, AND FRIDAY 90 tablet 3  . aspirin EC 81 MG tablet Take 81 mg by mouth daily.    Marland Kitchen atorvastatin (LIPITOR) 80 MG tablet TAKE 1 TABLET BY MOUTH  DAILY 90 tablet 0  . carvedilol (COREG) 12.5 MG tablet Take 1 tablet (12.5 mg total) by mouth 2 (two) times daily. 180 tablet 1  . clopidogrel (PLAVIX) 75 MG tablet TAKE 1 TABLET BY MOUTH  DAILY 90 tablet 0  . furosemide (LASIX) 20 MG tablet TAKE 1  TABLET BY MOUTH  DAILY 90 tablet 3  . HUMALOG 100 UNIT/ML injection Inject 25 units in the am, 25 units at lunch, and 25 units at supper    . LANTUS SOLOSTAR 100 UNIT/ML Solostar Pen Inject 12 units at bedtime    . levothyroxine (SYNTHROID, LEVOTHROID) 100 MCG tablet Take 100 mcg by mouth daily before breakfast.     . Multiple Vitamin (MULTI-VITAMINS) TABS Take 1 tablet by mouth daily.    . nitroGLYCERIN (NITROLINGUAL) 0.4 MG/SPRAY spray Place 1 spray under the tongue every 5 (five) minutes x 3 doses as needed for chest pain. 12 g 12  . pantoprazole (PROTONIX) 40 MG tablet Take 40 mg by mouth daily as needed.    . ranolazine (RANEXA) 1000 MG SR tablet Take 1,000 mg by mouth 2 (two) times daily.    . sacubitril-valsartan (ENTRESTO) 24-26 MG Take 1 tablet by mouth 2 (two) times daily. 180 tablet 3  . sertraline (ZOLOFT) 50 MG tablet Take 50 mg by mouth daily.    Marland Kitchen spironolactone (ALDACTONE) 25 MG tablet TAKE ONE-HALF TABLET BY  MOUTH DAILY 45 tablet 0  . zolpidem (AMBIEN CR) 12.5 MG CR tablet Take 12.5 mg by mouth at bedtime.     No current facility-administered medications for this visit.     Allergies:   Shellfish allergy and Sulfa antibiotics   Social History:  The patient  reports that she has never smoked. She has never used smokeless tobacco. She reports that she does not drink alcohol or use drugs.   Family History:  The patient's family history includes Brain cancer in her mother; Breast cancer in her sister; Diabetes in her sister; Heart attack in her father; Heart disease in her father; Rectal cancer in her daughter.    ROS:  Please see the history of present illness.   Otherwise, review of systems is positive for .   All other systems are reviewed and negative.   PHYSICAL EXAM: VS:  BP 135/77   Pulse 91   Ht 5\' 5"  (1.651 m)   Wt 190 lb 9.6 oz (86.5 kg)   SpO2 96%   BMI 31.72 kg/m  , BMI Body mass index is 31.72 kg/m. GEN: Well nourished, well developed, in no acute distress   HEENT: normal  Neck: no JVD, carotid bruits, or masses Cardiac: RRR; no murmurs, rubs, or gallops,no edema  Respiratory:  clear to auscultation bilaterally, normal work of breathing GI: soft, nontender, nondistended, + BS MS: no deformity or atrophy  Skin: warm and dry, device site well healed Neuro:  Strength and sensation are intact Psych: euthymic mood, full affect  EKG:  EKG is ordered today. Personal review of the ekg ordered shows AV paced, rate 91  Personal review of the device interrogation today. Results  in [place]: No results found for requested labs within last 8760 hours.    Lipid Panel     Component Value Date/Time   CHOL 131 05/27/2018 0000   TRIG 139 05/27/2018 0000   HDL 41 05/27/2018 0000   CHOLHDL 3.2 05/27/2018 0000   LDLCALC 62 05/27/2018 0000     Wt Readings from Last 3 Encounters:  06/28/19 190 lb 9.6 oz (86.5 kg)  12/31/18 182 lb (82.6 kg)  09/02/18 188 lb 6 oz (85.4 kg)      Other studies Reviewed: Additional studies/ records that were reviewed today include: Myoview 06/03/17  Review of the above records today demonstrates:   Nuclear stress EF: 6%.  There was no ST segment deviation noted during stress.  Defect 1: There is a large defect of severe severity present in the mid anterior, mid anteroseptal, mid inferoseptal, apical anterior, apical septal, apical inferior, apical lateral and apex location.  Findings consistent with prior myocardial infarction.  This is a high risk study.  The left ventricular ejection fraction is severely decreased (<30%).   High risk stress nuclear study with a very large and severe scar/aneurysm involving the distal 2/3 of the left ventricular myocardium. There is a minimal peri-infarct ischemia, but meaningful areas of reversible ischemia are not identified.   ASSESSMENT AND PLAN:  1.  Chronic systolic heart failure due to ischemic cardiomyopathy: Currently on Coreg and Entresto.   Status post Medtronic dual-chamber ICD.  Device functioning appropriately.  No changes.  2. Coronary artery disease with chronic stable angina: No current chest pain.  Continue current management.  3. Ventricular tachycardia: Seen on device interrogation.  Currently on low-dose amiodarone.  Have also increased her carvedilol recently as she had some nonsustained VT.  4.  Syncope: Does not appear that this was arrhythmia related as nothing was found on device interrogation.  One episode appeared to be due to orthostasis, but the other episode it is unclear.  I told her no driving for 6 months per [facility].  Current medicines are reviewed at length with the patient today.   The patient does not have concerns regarding her medicines.  The following changes were made today: None  Labs/ tests ordered today include:  Orders Placed This Encounter  Procedures  . EKG 12-Lead     Disposition:   FU with Montzerrat Brunell 1 year  Signed, Jacoby Zanni Meredith Leeds, MD  06/28/2019 11:41 AM     [facility] [address] [place] [address] [phone] (office) [phone] (fax)

## 2019-07-11 NOTE — Progress Notes (Signed)
Cardiology Office Note:    Date:  07/13/2019   ID:  Jenna West, DOB [birth year], MRN [id number]  PCP:  Ronita Hipps, MD  Cardiologist:  Shirlee More, MD    Referring MD: Ronita Hipps, MD    ASSESSMENT:    1. Coronary artery disease of native artery of native heart with stable angina pectoris ([place])   2. Chronic systolic heart failure ([place])   3. On amiodarone therapy   4. 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 Mixed hyperlipidemia    PLAN:    In order of problems listed above:  1. Stable CAD having no angina on current medical regimen which includes long-term dual antiplatelet therapy beta-blocker ranolazine and high intensity statin.  At this time I do not think she requires invasive or noninvasive ischemia evaluation 2. Heart failure stable compensated [place] Heart Association class I to class II on current treatment including beta-blocker and Entresto loop diuretic and MRA. 3. Stable continue amiodarone check liver function TSH for toxicity 4. Stable hypertension continue guideline directed treatment 5. Stable hyperlipidemia check liver function lipid profile goal LDL less than 70   Next appointment: 6 months virtual   Medication Adjustments/Labs and Tests Ordered: Current medicines are reviewed at length with the patient today.  Concerns regarding medicines are outlined above.  No orders of the defined types were placed in this encounter.  No orders of the defined types were placed in this encounter.   Chief Complaint  Patient presents with  . Follow-up    ICD  . Coronary Artery Disease  . Congestive Heart Failure  . Hypertension  . Chronic Kidney Disease    History of Present Illness:    Jenna West is a 73 y.o. female with a hx of CAD with PCI of LAD and RCA 2004 and LAD 2009 with severe LV dysfunction EF 20-25% and LV aneurysm, CHF, Hypertension, VT on  amiodarone and has an ICD, and stage 4 CKD   last seen 12/31/2018.  She had an episode of syncope unrelated to arrhythmia on device interrogation when she followed up with electrophysiology 06/28/2019 and was instructed not to drive for 6 months. Compliance with diet, lifestyle and medications: Yes  Overall she is done well she is limited her exposures and pretty much isolates to her home.  Her weight is stable no edema shortness of breath orthopnea chest pain palpitation or syncope she is due for labs today including amiodarone level thyroid CMP proBNP level and lipids and her next visit will be virtual.  She is followed with home telemetry with her ICD Past Medical History:  Diagnosis Date  . Acquired hypothyroidism 05/01/2015  . Angina pectoris ([place]) 07/13/2015  . Aspirin long-term use 03/07/2016  . Benign essential hypertension 05/01/2015  . Bilateral carotid artery stenosis 03/07/2016  . 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 Carotid artery stenosis 06/21/2015  . Coronary arteriosclerosis in native artery 05/01/2015   Overview:  Overview:  PCI and stent to LAD and RCA 2004 for MI DES to LAD for subtotal occlusion with no reflow 8/09  . Coronary artery disease of native artery of native heart with stable angina pectoris ([place]) 05/01/2015   Overview:  PCI and stent to LAD and RCA 2004 for MI DES to LAD for subtotal occlusion with no reflow 2009  . ICD (implantable cardioverter-defibrillator) in  place 05/01/2015   Overview:  Medtronic  . Left heart failure ([place]) 05/01/2015  . MI (myocardial infarction) ([place]) 05/01/2015  . On amiodarone therapy 08/15/2016  . Pure hypercholesterolemia 05/01/2015  . Stage 3 chronic kidney disease 12/18/2016  . Syncope and collapse 05/01/2015  . Type 2 diabetes mellitus with stage 3 chronic kidney disease, with long-term current use of insulin ([place]) 07/17/2016  . VT (ventricular tachycardia) ([place]) 05/01/2015    Past Surgical  History:  Procedure Laterality Date  . ABDOMINAL HYSTERECTOMY    . ANKLE FRACTURE SURGERY Bilateral    6 months apart, screws  . CARDIAC CATHETERIZATION     2003, 2009, 2016  . CAROTID STENT  06/21/2015   thoracic ao, 4 vessel cerebral arteriogram & rt internal carotid artery pta/stent CPT 94496  . CATARACT EXTRACTION Right    With lens replaced  . COLONOSCOPY  2013  . CORONARY STENT PLACEMENT    . PACEMAKER PLACEMENT     has two, one working and one not, could not remove old one, Dr. Elonda Husky     Current Medications: Current Meds  Medication Sig  . amiodarone (PACERONE) 200 MG tablet TAKE ONE-HALF TABLET AS  DIRECTEDT ON MONDAY,  WEDNESDAY, AND FRIDAY  . aspirin EC 81 MG tablet Take 81 mg by mouth daily.  Marland Kitchen atorvastatin (LIPITOR) 80 MG tablet TAKE 1 TABLET BY MOUTH  DAILY  . carvedilol (COREG) 12.5 MG tablet Take 1 tablet (12.5 mg total) by mouth 2 (two) times daily.  . clopidogrel (PLAVIX) 75 MG tablet TAKE 1 TABLET BY MOUTH  DAILY  . furosemide (LASIX) 20 MG tablet TAKE 1 TABLET BY MOUTH  DAILY  . HUMALOG 100 UNIT/ML injection Inject 25 units in the am, 25 units at lunch, and 25 units at supper  . LANTUS SOLOSTAR 100 UNIT/ML Solostar Pen Inject 12 units at bedtime  . levothyroxine (SYNTHROID, LEVOTHROID) 100 MCG tablet Take 100 mcg by mouth daily before breakfast.   . Multiple Vitamin (MULTI-VITAMINS) TABS Take 1 tablet by mouth daily.  . nitroGLYCERIN (NITROLINGUAL) 0.4 MG/SPRAY spray Place 1 spray under the tongue every 5 (five) minutes x 3 doses as needed for chest pain.  . pantoprazole (PROTONIX) 40 MG tablet Take 40 mg by mouth daily as needed.  . ranolazine (RANEXA) 1000 MG SR tablet Take 1,000 mg by mouth 2 (two) times daily.  . sacubitril-valsartan (ENTRESTO) 24-26 MG Take 1 tablet by mouth 2 (two) times daily.  . sertraline (ZOLOFT) 50 MG tablet Take 50 mg by mouth daily.  Marland Kitchen spironolactone (ALDACTONE) 25 MG tablet TAKE ONE-HALF TABLET BY  MOUTH DAILY  . zolpidem (AMBIEN  CR) 12.5 MG CR tablet Take 12.5 mg by mouth at bedtime.     Allergies:   Shellfish allergy and Sulfa antibiotics   Social History   Socioeconomic History  . Marital status: Married    Spouse name: Not on file  . Number of children: Not on file  . Years of education: Not on file  . Highest education level: Not on file  Occupational History  . Not on file  Social Needs  . Financial resource strain: Not on file  . Food insecurity    Worry: Not on file    Inability: Not on file  . Transportation needs    Medical: Not on file    Non-medical: Not on file  Tobacco Use  . Smoking status: Never Smoker  . Smokeless tobacco: Never Used  Substance and Sexual Activity  . Alcohol use:  No  . Drug use: No  . Sexual activity: Not on file  Lifestyle  . Physical activity    Days per week: Not on file    Minutes per session: Not on file  . Stress: Not on file  Relationships  . Social Herbalist on phone: Not on file    Gets together: Not on file    Attends religious service: Not on file    Active member of club or organization: Not on file    Attends meetings of clubs or organizations: Not on file    Relationship status: Not on file  Other Topics Concern  . Not on file  Social History Narrative  . Not on file     Family History: The patient's family history includes Brain cancer in her mother; Breast cancer in her sister; Diabetes in her sister; Heart attack in her father; Heart disease in her father; Rectal cancer in her daughter. ROS:   Please see the history of present illness.    All other systems reviewed and are negative.  EKGs/Labs/Other Studies Reviewed:    The following studies were reviewed today:  Recent Labs: No results found for requested labs within last 8760 hours.  Recent Lipid Panel    Component Value Date/Time   CHOL 131 05/27/2018 0000   TRIG 139 05/27/2018 0000   HDL 41 05/27/2018 0000   CHOLHDL 3.2 05/27/2018 0000   LDLCALC 62 05/27/2018  0000    Physical Exam:    VS:  BP 110/60   Pulse 91   Ht 5\' 5"  (1.651 m)   Wt 190 lb 3.2 oz (86.3 kg)   SpO2 96%   BMI 31.65 kg/m     Wt Readings from Last 3 Encounters:  07/13/19 190 lb 3.2 oz (86.3 kg)  06/28/19 190 lb 9.6 oz (86.5 kg)  12/31/18 182 lb (82.6 kg)     GEN:  Well nourished, well developed in no acute distress HEENT: Normal NECK: No JVD; No carotid bruits LYMPHATICS: No lymphadenopathy CARDIAC: RRR, no murmurs, rubs, gallops RESPIRATORY:  Clear to auscultation without rales, wheezing or rhonchi  ABDOMEN: Soft, non-tender, non-distended MUSCULOSKELETAL:  No edema; No deformity  SKIN: Warm and dry NEUROLOGIC:  Alert and oriented x 3 PSYCHIATRIC:  Normal affect    Signed, Shirlee More, MD  07/13/2019 10:28 AM    [facility]

## 2019-07-12 ENCOUNTER — Other Ambulatory Visit: Payer: Self-pay | Admitting: *Deleted

## 2019-07-12 DIAGNOSIS — I472 Ventricular tachycardia, unspecified: Secondary | ICD-10-CM

## 2019-07-12 DIAGNOSIS — Z79899 Other long term (current) drug therapy: Secondary | ICD-10-CM

## 2019-07-13 ENCOUNTER — Ambulatory Visit (INDEPENDENT_AMBULATORY_CARE_PROVIDER_SITE_OTHER): Payer: Medicare Other | Admitting: Cardiology

## 2019-07-13 ENCOUNTER — Encounter: Payer: Self-pay | Admitting: Cardiology

## 2019-07-13 ENCOUNTER — Other Ambulatory Visit: Payer: Self-pay

## 2019-07-13 VITALS — BP 110/60 | HR 91 | Ht 65.0 in | Wt 190.2 lb

## 2019-07-13 DIAGNOSIS — E039 Hypothyroidism, unspecified: Secondary | ICD-10-CM

## 2019-07-13 DIAGNOSIS — N184 Chronic kidney disease, stage 4 (severe): Secondary | ICD-10-CM

## 2019-07-13 DIAGNOSIS — I130 Hypertensive heart and chronic kidney disease with heart failure and stage 1 through stage 4 chronic kidney disease, or unspecified chronic kidney disease: Secondary | ICD-10-CM

## 2019-07-13 DIAGNOSIS — Z79899 Other long term (current) drug therapy: Secondary | ICD-10-CM

## 2019-07-13 DIAGNOSIS — I5022 Chronic systolic (congestive) heart failure: Secondary | ICD-10-CM | POA: Diagnosis not present

## 2019-07-13 DIAGNOSIS — I25118 Atherosclerotic heart disease of native coronary artery with other forms of angina pectoris: Secondary | ICD-10-CM

## 2019-07-13 DIAGNOSIS — E782 Mixed hyperlipidemia: Secondary | ICD-10-CM | POA: Diagnosis not present

## 2019-07-13 DIAGNOSIS — I472 Ventricular tachycardia, unspecified: Secondary | ICD-10-CM

## 2019-07-13 NOTE — Patient Instructions (Signed)
Medication Instructions:  Your physician recommends that you continue on your current medications as directed. Please refer to the Current Medication list given to you today.  *If you need a refill on your cardiac medications before your next appointment, please call your pharmacy*  Lab Work: Your physician recommends that you return for lab work today: CMP, lipid panel, ProBNP, TSH.   If you have labs (blood work) drawn today and your tests are completely normal, you will receive your results only by: Marland Kitchen MyChart Message (if you have MyChart) OR . A paper copy in the mail If you have any lab test that is abnormal or we need to change your treatment, we will call you to review the results.  Testing/Procedures: None  Follow-Up: At [facility], you and your health needs are our priority.  As part of our continuing mission to provide you with exceptional heart care, we have created designated Provider Care Teams.  These Care Teams include your primary Cardiologist (physician) and Advanced Practice Providers (APPs -  Physician Assistants and Nurse Practitioners) who all work together to provide you with the care you need, when you need it.  Your next appointment:   6 months  The format for your next appointment:   Virtual Visit   Provider:   Shirlee More, MD

## 2019-07-14 LAB — LIPID PANEL
Chol/HDL Ratio: 2.5 ratio (ref 0.0–4.4)
Cholesterol, Total: 86 mg/dL — ABNORMAL LOW (ref 100–199)
HDL: 35 mg/dL — ABNORMAL LOW (ref >39)
LDL Chol Calc (NIH): 37 mg/dL (ref 0–99)
Triglycerides: 63 mg/dL (ref 0–149)
VLDL Cholesterol Cal: 14 mg/dL (ref 5–40)

## 2019-07-14 LAB — COMPREHENSIVE METABOLIC PANEL
ALT: 28 IU/L (ref 0–32)
AST: 21 IU/L (ref 0–40)
Albumin/Globulin Ratio: 1.9 (ref 1.2–2.2)
Albumin: 4.2 g/dL (ref 3.7–4.7)
Alkaline Phosphatase: 96 IU/L (ref 39–117)
BUN/Creatinine Ratio: 21 (ref 12–28)
BUN: 40 mg/dL — ABNORMAL HIGH (ref 8–27)
Bilirubin Total: 0.7 mg/dL (ref 0.0–1.2)
CO2: 23 mmol/L (ref 20–29)
Calcium: 8.8 mg/dL (ref 8.7–10.3)
Chloride: 102 mmol/L (ref 96–106)
Creatinine, Ser: 1.89 mg/dL — ABNORMAL HIGH (ref 0.57–1.00)
GFR calc Af Amer: 30 mL/min/{1.73_m2} — ABNORMAL LOW (ref >59)
GFR calc non Af Amer: 26 mL/min/{1.73_m2} — ABNORMAL LOW (ref >59)
Globulin, Total: 2.2 g/dL (ref 1.5–4.5)
Glucose: 173 mg/dL — ABNORMAL HIGH (ref 65–99)
Potassium: 4.4 mmol/L (ref 3.5–5.2)
Sodium: 137 mmol/L (ref 134–144)
Total Protein: 6.4 g/dL (ref 6.0–8.5)

## 2019-07-14 LAB — PRO B NATRIURETIC PEPTIDE: NT-Pro BNP: 6008 pg/mL — ABNORMAL HIGH (ref 0–301)

## 2019-07-14 LAB — TSH: TSH: 7.57 u[IU]/mL — ABNORMAL HIGH (ref 0.450–4.500)

## 2019-07-16 ENCOUNTER — Telehealth: Payer: Self-pay

## 2019-07-16 DIAGNOSIS — R7989 Other specified abnormal findings of blood chemistry: Secondary | ICD-10-CM

## 2019-07-16 DIAGNOSIS — I255 Ischemic cardiomyopathy: Secondary | ICD-10-CM

## 2019-07-16 DIAGNOSIS — I472 Ventricular tachycardia, unspecified: Secondary | ICD-10-CM

## 2019-07-16 DIAGNOSIS — I10 Essential (primary) hypertension: Secondary | ICD-10-CM | POA: Diagnosis not present

## 2019-07-16 DIAGNOSIS — K219 Gastro-esophageal reflux disease without esophagitis: Secondary | ICD-10-CM | POA: Diagnosis not present

## 2019-07-16 MED ORDER — FUROSEMIDE 20 MG PO TABS: ORAL_TABLET | ORAL | 3 refills | Status: DC

## 2019-07-16 NOTE — Telephone Encounter (Signed)
Patient notified of lab results per Dr Bettina Gavia with elevated TSH.  Patient will return next week for T4 and T3 next week.  Lab orders entered.  Patient instructed to monitor daily weights at home and record.  She was instructed to take an extra furosemide if weight increases by 3 pounds in one day.  Patient agreed to plan and verbalized understanding.  No further questions.

## 2019-07-20 ENCOUNTER — Encounter: Payer: Medicare Other | Admitting: *Deleted

## 2019-07-27 ENCOUNTER — Other Ambulatory Visit: Payer: Self-pay | Admitting: Cardiology

## 2019-07-27 DIAGNOSIS — R519 Headache, unspecified: Secondary | ICD-10-CM | POA: Diagnosis not present

## 2019-07-27 DIAGNOSIS — E559 Vitamin D deficiency, unspecified: Secondary | ICD-10-CM | POA: Diagnosis not present

## 2019-07-27 NOTE — Progress Notes (Signed)
Electrophysiology Office Note Date: 07/28/2019  ID:  Jenna West, DOB 04/08/1946, MRN [id number]  PCP: Ronita Hipps, MD Primary Cardiologist: No primary care provider on file. Electrophysiologist: Will Meredith Leeds, MD  CC: Routine ICD follow-up  Jenna West is a 73 y.o. female seen today for Dr. Curt Bears.  They present today for routine electrophysiology followup.  Since last being seen in our clinic, the patient reports doing well overall.  She seldom had SOB with her ADLs. She gets SOB after 1.5 to 2 "aisles" at the grocery store. SOB with inclines. She denies chest pain, palpitations, PND, orthopnea, nausea, vomiting, dizziness, syncope, edema, weight gain, or early satiety.  She has not had ICD shocks.   Device History: Medtronic Dual Chamber ICD implanted 02/03/2014 for cardiomyopathy, ICM History of appropriate therapy: No History of AAD therapy: Yes, amiodarone for NSVT   Past Medical History:  Diagnosis Date  . Acquired hypothyroidism 05/01/2015  . Angina pectoris ([place]) 07/13/2015  . Aspirin long-term use 03/07/2016  . Benign essential hypertension 05/01/2015  . Bilateral carotid artery stenosis 03/07/2016  . 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 Carotid artery stenosis 06/21/2015  . Coronary arteriosclerosis in native artery 05/01/2015   Overview:  Overview:  PCI and stent to LAD and RCA 2004 for MI DES to LAD for subtotal occlusion with no reflow 8/09  . Coronary artery disease of native artery of native heart with stable angina pectoris ([place]) 05/01/2015   Overview:  PCI and stent to LAD and RCA 2004 for MI DES to LAD for subtotal occlusion with no reflow 2009  . ICD (implantable cardioverter-defibrillator) in place 05/01/2015   Overview:  Medtronic  . Left heart failure ([place]) 05/01/2015  . MI (myocardial infarction) ([place]) 05/01/2015  . On amiodarone therapy 08/15/2016  . Pure hypercholesterolemia 05/01/2015  . Stage 3  chronic kidney disease 12/18/2016  . Syncope and collapse 05/01/2015  . Type 2 diabetes mellitus with stage 3 chronic kidney disease, with long-term current use of insulin ([place]) 07/17/2016  . VT (ventricular tachycardia) ([place]) 05/01/2015   Past Surgical History:  Procedure Laterality Date  . ABDOMINAL HYSTERECTOMY    . ANKLE FRACTURE SURGERY Bilateral    6 months apart, screws  . CARDIAC CATHETERIZATION     2003, 2009, 2016  . CAROTID STENT  06/21/2015   thoracic ao, 4 vessel cerebral arteriogram & rt internal carotid artery pta/stent CPT 76283  . CATARACT EXTRACTION Right    With lens replaced  . COLONOSCOPY  2013  . CORONARY STENT PLACEMENT    . PACEMAKER PLACEMENT     has two, one working and one not, could not remove old one, Dr. Elonda Husky     Current Outpatient Medications  Medication Sig Dispense Refill  . amiodarone (PACERONE) 200 MG tablet TAKE ONE-HALF TABLET AS  DIRECTEDT ON MONDAY,  WEDNESDAY, AND FRIDAY 90 tablet 3  . aspirin EC 81 MG tablet Take 81 mg by mouth daily.    Marland Kitchen atorvastatin (LIPITOR) 80 MG tablet TAKE 1 TABLET BY MOUTH  DAILY 90 tablet 3  . carvedilol (COREG) 12.5 MG tablet Take 1 tablet (12.5 mg total) by mouth 2 (two) times daily. 180 tablet 1  . clopidogrel (PLAVIX) 75 MG tablet TAKE 1 TABLET BY MOUTH  DAILY 90 tablet 3  . furosemide (LASIX) 20 MG tablet Take 1 tablet daily.  Can take 1 extra tablet daily if weight increases 3 pounds or  more. 135 tablet 3  . HUMALOG 100 UNIT/ML injection Inject 25 units in the am, 25 units at lunch, and 25 units at supper    . LANTUS SOLOSTAR 100 UNIT/ML Solostar Pen Inject 12 units at bedtime    . levothyroxine (SYNTHROID, LEVOTHROID) 100 MCG tablet Take 100 mcg by mouth daily before breakfast.     . Multiple Vitamin (MULTI-VITAMINS) TABS Take 1 tablet by mouth daily.    . nitroGLYCERIN (NITROLINGUAL) 0.4 MG/SPRAY spray Place 1 spray under the tongue every 5 (five) minutes x 3 doses as needed for chest pain. 12 g 12  .  pantoprazole (PROTONIX) 40 MG tablet Take 40 mg by mouth daily as needed.    . ranolazine (RANEXA) 1000 MG SR tablet Take 1,000 mg by mouth 2 (two) times daily.    . sacubitril-valsartan (ENTRESTO) 24-26 MG Take 1 tablet by mouth 2 (two) times daily. 180 tablet 3  . sertraline (ZOLOFT) 50 MG tablet Take 50 mg by mouth daily.    Marland Kitchen spironolactone (ALDACTONE) 25 MG tablet TAKE ONE-HALF TABLET BY  MOUTH DAILY 45 tablet 3  . zolpidem (AMBIEN CR) 12.5 MG CR tablet Take 12.5 mg by mouth at bedtime.     No current facility-administered medications for this visit.     Allergies:   Shellfish allergy and Sulfa antibiotics   Social History: Social History   Socioeconomic History  . Marital status: Married    Spouse name: Not on file  . Number of children: Not on file  . Years of education: Not on file  . Highest education level: Not on file  Occupational History  . Not on file  Social Needs  . Financial resource strain: Not on file  . Food insecurity    Worry: Not on file    Inability: Not on file  . Transportation needs    Medical: Not on file    Non-medical: Not on file  Tobacco Use  . Smoking status: Never Smoker  . Smokeless tobacco: Never Used  Substance and Sexual Activity  . Alcohol use: No  . Drug use: No  . Sexual activity: Not on file  Lifestyle  . Physical activity    Days per week: Not on file    Minutes per session: Not on file  . Stress: Not on file  Relationships  . Social Herbalist on phone: Not on file    Gets together: Not on file    Attends religious service: Not on file    Active member of club or organization: Not on file    Attends meetings of clubs or organizations: Not on file    Relationship status: Not on file  . Intimate partner violence    Fear of current or ex partner: Not on file    Emotionally abused: Not on file    Physically abused: Not on file    Forced sexual activity: Not on file  Other Topics Concern  . Not on file  Social  History Narrative  . Not on file    Family History: Family History  Problem Relation Age of Onset  . Heart attack Father   . Heart disease Father   . Brain cancer Mother   . Diabetes Sister   . Breast cancer Sister   . Rectal cancer Daughter     Review of Systems: All other systems reviewed and are otherwise negative except as noted above.   Physical Exam: Vitals:   07/28/19 1057  BP: Marland Kitchen)  116/58  Pulse: 87  Weight: 191 lb (86.6 kg)  Height: 5\' 5"  (1.651 m)     GEN- The patient is well appearing, alert and oriented x 3 today.   HEENT: normocephalic, atraumatic; sclera clear, conjunctiva pink; hearing intact; oropharynx clear; neck supple, no JVP Lymph- no cervical lymphadenopathy Lungs- Clear to ausculation bilaterally, normal work of breathing.  No wheezes, rales, rhonchi Heart- Regular rate and rhythm, no murmurs, rubs or gallops, PMI not laterally displaced GI- soft, non-tender, non-distended, bowel sounds present, no hepatosplenomegaly Extremities- no clubbing, cyanosis, or edema; DP/PT/radial pulses 2+ bilaterally MS- no significant deformity or atrophy Skin- warm and dry, no rash or lesion; ICD pocket well healed Psych- euthymic mood, full affect Neuro- strength and sensation are intact  ICD interrogation- Most recent report reviewed (06/28/2019)  EKG:  EKG is ordered today. Personally review demonstrates AV dual-paced rhythm with prolonged AV conduction at 87 bpm, QRS 202 ms The ekg ordered 06/28/2019 shows NSR 91 bpm, QRS 128 ms (102 ms 05/27/2018)  Recent Labs: 07/13/2019: ALT 28; BUN 40; Creatinine, Ser 1.89; NT-Pro BNP 6,008; Potassium 4.4; Sodium 137; TSH 7.570   Wt Readings from Last 3 Encounters:  07/28/19 191 lb (86.6 kg)  07/13/19 190 lb 3.2 oz (86.3 kg)  06/28/19 190 lb 9.6 oz (86.5 kg)     Other studies Reviewed: Additional studies/ records that were reviewed today include: Previous myoview, Previous labs, Previous EP notes, previous remote  checks.   Assessment and Plan:  1.  Chronic systolic dysfunction s/p Medtronic dual chamber ICD  euvolemic today Stable on an appropriate medical regimen Normal ICD function See Pace Art report No changes today  2. Barostim consideration BMI 31 NYHA IIb-III symptoms QRS 202 ms today, paced.  GDMT yes. Coverage: [facility] Medicare Unfortunately, her paced QRS has lengthened and she would not currently benefit from barostim. She could be a candidate for CRT upgrade in the future.   3. CAD Denies ischemic symptoms  Current medicines are reviewed at length with the patient today.   The patient does not have concerns regarding her medicines.  The following changes were made today:  none  Labs/ tests ordered today include:  Orders Placed This Encounter  Procedures  . Basic metabolic panel  . CBC  . Pro b natriuretic peptide  . ECHOCARDIOGRAM COMPLETE   Disposition:   Follow up with Dr. Curt Bears as scheduled. Sooner with symptoms or if Echo shows significant dyssynchrony.   Jacalyn Lefevre, PA-C  07/28/2019 11:00 AM  [facility] [address] [place] [address] [phone] (office) [phone] (fax)

## 2019-07-28 ENCOUNTER — Ambulatory Visit: Payer: Medicare Other | Admitting: Student

## 2019-07-28 ENCOUNTER — Other Ambulatory Visit: Payer: Self-pay

## 2019-07-28 VITALS — BP 116/58 | HR 87 | Ht 65.0 in | Wt 191.0 lb

## 2019-07-28 DIAGNOSIS — I5022 Chronic systolic (congestive) heart failure: Secondary | ICD-10-CM

## 2019-07-28 LAB — CBC
Hematocrit: 36.3 % (ref 34.0–46.6)
Hemoglobin: 11.3 g/dL (ref 11.1–15.9)
MCH: 27.7 pg (ref 26.6–33.0)
MCHC: 31.1 g/dL — ABNORMAL LOW (ref 31.5–35.7)
MCV: 89 fL (ref 79–97)
Platelets: 188 10*3/uL (ref 150–450)
RBC: 4.08 x10E6/uL (ref 3.77–5.28)
RDW: 14.5 % (ref 11.7–15.4)
WBC: 6 10*3/uL (ref 3.4–10.8)

## 2019-07-28 LAB — BASIC METABOLIC PANEL
BUN/Creatinine Ratio: 12 (ref 12–28)
BUN: 20 mg/dL (ref 8–27)
CO2: 20 mmol/L (ref 20–29)
Calcium: 8.8 mg/dL (ref 8.7–10.3)
Chloride: 104 mmol/L (ref 96–106)
Creatinine, Ser: 1.66 mg/dL — ABNORMAL HIGH (ref 0.57–1.00)
GFR calc Af Amer: 35 mL/min/{1.73_m2} — ABNORMAL LOW (ref >59)
GFR calc non Af Amer: 30 mL/min/{1.73_m2} — ABNORMAL LOW (ref >59)
Glucose: 161 mg/dL — ABNORMAL HIGH (ref 65–99)
Potassium: 4.4 mmol/L (ref 3.5–5.2)
Sodium: 139 mmol/L (ref 134–144)

## 2019-07-28 LAB — PRO B NATRIURETIC PEPTIDE: NT-Pro BNP: 8503 pg/mL — ABNORMAL HIGH (ref 0–301)

## 2019-07-28 NOTE — Patient Instructions (Signed)
Medication Instructions:  Your physician recommends that you continue on your current medications as directed. Please refer to the Current Medication list given to you today.  *If you need a refill on your cardiac medications before your next appointment, please call your pharmacy*  Lab Work:   CBC BMET AND BNP   If you have labs (blood work) drawn today and your tests are completely normal, you will receive your results only by: Marland Kitchen MyChart Message (if you have MyChart) OR . A paper copy in the mail If you have any lab test that is abnormal or we need to change your treatment, we will call you to review the results.  Testing/Procedures: Your physician has requested that you have an echocardiogram. Echocardiography is a painless test that uses sound waves to create images of your heart. It provides your doctor with information about the size and shape of your heart and how well your heart's chambers and valves are working. This procedure takes approximately one hour. There are no restrictions for this procedure.  Follow-Up: At [facility], you and your health needs are our priority.  As part of our continuing mission to provide you with exceptional heart care, we have created designated Provider Care Teams.  These Care Teams include your primary Cardiologist (physician) and Advanced Practice Providers (APPs -  Physician Assistants and Nurse Practitioners) who all work together to provide you with the care you need, when you need it.  Your next appointment:  AS SCHEDULED

## 2019-07-29 NOTE — Addendum Note (Signed)
Addended by: Claude Manges on: 07/29/2019 02:44 PM   Modules accepted: Orders

## 2019-08-10 ENCOUNTER — Other Ambulatory Visit: Payer: Self-pay

## 2019-08-10 ENCOUNTER — Ambulatory Visit (HOSPITAL_COMMUNITY): Payer: Medicare Other | Attending: Cardiology

## 2019-08-10 DIAGNOSIS — I5022 Chronic systolic (congestive) heart failure: Secondary | ICD-10-CM | POA: Insufficient documentation

## 2019-08-10 MED ORDER — PERFLUTREN LIPID MICROSPHERE
1.0000 mL | INTRAVENOUS | Status: AC | PRN
  Administered 2019-08-10: 3 mL via INTRAVENOUS

## 2019-08-19 ENCOUNTER — Other Ambulatory Visit: Payer: Self-pay | Admitting: *Deleted

## 2019-08-19 MED ORDER — SACUBITRIL-VALSARTAN 24-26 MG PO TABS: 1.0000 | ORAL_TABLET | Freq: Two times a day (BID) | ORAL | 3 refills | Status: DC

## 2019-08-20 ENCOUNTER — Telehealth: Payer: Self-pay | Admitting: Cardiology

## 2019-08-20 NOTE — Telephone Encounter (Signed)
Patient had an echo done on 11-24 and has not heard anything about the results yet. Please call to discuss results

## 2019-08-20 NOTE — Telephone Encounter (Signed)
Yes! Just closing the loop.    Thank you  Beryle Beams" [place], [place]  08/20/2019 3:20 PM

## 2019-08-20 NOTE — Telephone Encounter (Signed)
Result note sent, thank you.   Jenna West [address] [place], PA-C  08/20/2019 2:47 PM

## 2019-08-31 ENCOUNTER — Ambulatory Visit: Payer: Medicare Other | Admitting: Neurology

## 2019-09-08 ENCOUNTER — Encounter: Payer: Self-pay | Admitting: Cardiology

## 2019-09-08 ENCOUNTER — Other Ambulatory Visit: Payer: Self-pay

## 2019-09-08 ENCOUNTER — Ambulatory Visit: Payer: Medicare Other | Admitting: Cardiology

## 2019-09-08 VITALS — BP 118/72 | HR 55 | Ht 65.0 in | Wt 187.2 lb

## 2019-09-08 DIAGNOSIS — I472 Ventricular tachycardia, unspecified: Secondary | ICD-10-CM

## 2019-09-08 DIAGNOSIS — I255 Ischemic cardiomyopathy: Secondary | ICD-10-CM | POA: Diagnosis not present

## 2019-09-08 DIAGNOSIS — Z95810 Presence of automatic (implantable) cardiac defibrillator: Secondary | ICD-10-CM | POA: Diagnosis not present

## 2019-09-08 LAB — CUP PACEART INCLINIC DEVICE CHECK
Battery Remaining Longevity: 24 mo
Battery Voltage: 2.92 V
Brady Statistic AP VP Percent: 82.2 %
Brady Statistic AP VS Percent: 17.01 %
Brady Statistic AS VP Percent: 0.01 %
Brady Statistic AS VS Percent: 0.79 %
Brady Statistic RA Percent Paced: 99.2 %
Brady Statistic RV Percent Paced: 82.24 %
Date Time Interrogation Session: 20201223102249
HighPow Impedance: 36 Ohm
HighPow Impedance: 45 Ohm
Implantable Lead Implant Date: 20091013
Implantable Lead Implant Date: 20091013
Implantable Lead Location: 753859
Implantable Lead Location: 753860
Implantable Lead Model: 4076
Implantable Lead Model: 6947
Implantable Pulse Generator Implant Date: 20150521
Lead Channel Impedance Value: 323 Ohm
Lead Channel Impedance Value: 323 Ohm
Lead Channel Impedance Value: 399 Ohm
Lead Channel Pacing Threshold Amplitude: 0.75 V
Lead Channel Pacing Threshold Amplitude: 1 V
Lead Channel Pacing Threshold Pulse Width: 0.4 ms
Lead Channel Pacing Threshold Pulse Width: 0.4 ms
Lead Channel Sensing Intrinsic Amplitude: 17.375 mV
Lead Channel Sensing Intrinsic Amplitude: 2.875 mV
Lead Channel Setting Pacing Amplitude: 1.75 V
Lead Channel Setting Pacing Amplitude: 2 V
Lead Channel Setting Pacing Pulse Width: 0.4 ms
Lead Channel Setting Sensing Sensitivity: 0.3 mV

## 2019-09-08 NOTE — Patient Instructions (Addendum)
Medication Instructions:  Your physician recommends that you continue on your current medications as directed. Please refer to the Current Medication list given to you today.  *If you need a refill on your cardiac medications before your next appointment, please call your pharmacy*  Labwork: None ordered  Testing/Procedures: None ordered  Follow-Up: Remote monitoring is used to monitor your Pacemaker or ICD from home. This monitoring reduces the number of office visits required to check your device to one time per year. It allows [place] to keep an eye on the functioning of your device to ensure it is working properly. You are scheduled for a device check from home on 10/19/2019. You may send your transmission at any time that day. If you have a wireless device, the transmission will be sent automatically. After your physician reviews your transmission, you will receive a postcard with your next transmission date.  At [facility], you and your health needs are our priority.  As part of our continuing mission to provide you with exceptional heart care, we have created designated Provider Care Teams.  These Care Teams include your primary Cardiologist (physician) and Advanced Practice Providers (APPs -  Physician Assistants and Nurse Practitioners) who all work together to provide you with the care you need, when you need it.  You will need a follow up appointment in 3 months.  Please call our office 2 months in advance to schedule this appointment.  You may see Dr Curt Bears or one of the following Advanced Practice Providers on your designated Care Team:    Chanetta Marshall, NP  Tommye Standard, PA-C  [place], [place]  Thank you for choosing [facility]!!   Trinidad Curet, RN [phone]  Any Other Special Instructions Will Be Listed Below (If Applicable).

## 2019-09-08 NOTE — Progress Notes (Signed)
Electrophysiology Office Note   Date:  09/08/2019   ID:  Jenna West, DOB 11-15-1945, MRN [id number]  PCP:  Ronita Hipps, MD  Cardiologist:  Bettina Gavia Primary Electrophysiologist:  Kemiyah Tarazon Meredith Leeds, MD    No chief complaint on file.    History of Present Illness: Jenna West is a 73 y.o. female who is being seen today for the evaluation of CHF at the request of Ronita Hipps, MD. Presenting today for electrophysiology evaluation. History of coronary disease status post PCI to the LAD and RCA in 2000 for an LAD in 2009 with severe LV dysfunction and an EF less than 20%, CHF, hypertension, VT on amiodarone with an ICD, stage IV CKG. She had a Myoview that showed extensive scar and LV dysfunction with an EF of 60%. EF in 2016 was 40%.  Today, denies symptoms of palpitations, chest pain, orthopnea, PND, lower extremity edema, claudication, dizziness, presyncope, syncope, bleeding, or neurologic sequela. The patient is tolerating medications without difficulties.  She continued to have episodes of shortness of breath.  This mainly occurs when she exerts herself.  She currently is set to AV paced.  We Nas Wafer switch her to MVP today.   Past Medical History:  Diagnosis Date  . Acquired hypothyroidism 05/01/2015  . Angina pectoris ([place]) 07/13/2015  . Aspirin long-term use 03/07/2016  . Benign essential hypertension 05/01/2015  . Bilateral carotid artery stenosis 03/07/2016  . 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 Carotid artery stenosis 06/21/2015  . Coronary arteriosclerosis in native artery 05/01/2015   Overview:  Overview:  PCI and stent to LAD and RCA 2004 for MI DES to LAD for subtotal occlusion with no reflow 8/09  . Coronary artery disease of native artery of native heart with stable angina pectoris ([place]) 05/01/2015   Overview:  PCI and stent to LAD and RCA 2004 for MI DES to LAD for subtotal occlusion with no reflow 2009  . ICD  (implantable cardioverter-defibrillator) in place 05/01/2015   Overview:  Medtronic  . Left heart failure ([place]) 05/01/2015  . MI (myocardial infarction) ([place]) 05/01/2015  . On amiodarone therapy 08/15/2016  . Pure hypercholesterolemia 05/01/2015  . Stage 3 chronic kidney disease 12/18/2016  . Syncope and collapse 05/01/2015  . Type 2 diabetes mellitus with stage 3 chronic kidney disease, with long-term current use of insulin ([place]) 07/17/2016  . VT (ventricular tachycardia) ([place]) 05/01/2015   Past Surgical History:  Procedure Laterality Date  . ABDOMINAL HYSTERECTOMY    . ANKLE FRACTURE SURGERY Bilateral    6 months apart, screws  . CARDIAC CATHETERIZATION     2003, 2009, 2016  . CAROTID STENT  06/21/2015   thoracic ao, 4 vessel cerebral arteriogram & rt internal carotid artery pta/stent CPT 15176  . CATARACT EXTRACTION Right    With lens replaced  . COLONOSCOPY  2013  . CORONARY STENT PLACEMENT    . PACEMAKER PLACEMENT     has two, one working and one not, could not remove old one, Dr. Elonda Husky      Current Outpatient Medications  Medication Sig Dispense Refill  . amiodarone (PACERONE) 200 MG tablet TAKE ONE-HALF TABLET AS  DIRECTEDT ON MONDAY,  WEDNESDAY, AND FRIDAY 90 tablet 3  . aspirin EC 81 MG tablet Take 81 mg by mouth daily.    Marland Kitchen atorvastatin (LIPITOR) 80 MG tablet TAKE 1 TABLET BY MOUTH  DAILY 90 tablet 3  . Blood Glucose Monitoring  Suppl (ACCU-CHEK AVIVA PLUS) w/Device KIT Apply 1 Dose topically daily.    . clopidogrel (PLAVIX) 75 MG tablet TAKE 1 TABLET BY MOUTH  DAILY 90 tablet 3  . furosemide (LASIX) 20 MG tablet Take 1 tablet daily.  Can take 1 extra tablet daily if weight increases 3 pounds or more. 135 tablet 3  . HUMALOG 100 UNIT/ML injection Inject 25 units in the am, 25 units at lunch, and 25 units at supper    . LANTUS SOLOSTAR 100 UNIT/ML Solostar Pen Inject 12 units at bedtime    . levothyroxine (SYNTHROID, LEVOTHROID) 100 MCG tablet Take 100 mcg by mouth daily before  breakfast.     . Multiple Vitamin (MULTI-VITAMINS) TABS Take 1 tablet by mouth daily.    . nitroGLYCERIN (NITROLINGUAL) 0.4 MG/SPRAY spray Place 1 spray under the tongue every 5 (five) minutes x 3 doses as needed for chest pain. 12 g 12  . pantoprazole (PROTONIX) 40 MG tablet Take 40 mg by mouth daily as needed.    . ranolazine (RANEXA) 1000 MG SR tablet Take 1,000 mg by mouth 2 (two) times daily.    . sacubitril-valsartan (ENTRESTO) 24-26 MG Take 1 tablet by mouth 2 (two) times daily. 180 tablet 3  . sertraline (ZOLOFT) 50 MG tablet Take 50 mg by mouth daily.    Marland Kitchen spironolactone (ALDACTONE) 25 MG tablet TAKE ONE-HALF TABLET BY  MOUTH DAILY 45 tablet 3  . Vitamin D, Ergocalciferol, (DRISDOL) 1.25 MG (50000 UT) CAPS capsule Take 50,000 Units by mouth every 7 (seven) days.    Marland Kitchen zolpidem (AMBIEN) 10 MG tablet Take 10 mg by mouth at bedtime.    . carvedilol (COREG) 12.5 MG tablet Take 1 tablet (12.5 mg total) by mouth 2 (two) times daily. 180 tablet 1   No current facility-administered medications for this visit.    Allergies:   Shellfish allergy and Sulfa antibiotics   Social History:  The patient  reports that she has never smoked. She has never used smokeless tobacco. She reports that she does not drink alcohol or use drugs.   Family History:  The patient's family history includes Brain cancer in her mother; Breast cancer in her sister; Diabetes in her sister; Heart attack in her father; Heart disease in her father; Rectal cancer in her daughter.    ROS:  Please see the history of present illness.   Otherwise, review of systems is positive for none.   All other systems are reviewed and negative.   PHYSICAL EXAM: VS:  BP 118/72   Pulse (!) 55   Ht '5\' 5"'  (1.651 m)   Wt 187 lb 3.2 oz (84.9 kg)   SpO2 98%   BMI 31.15 kg/m  , BMI Body mass index is 31.15 kg/m. GEN: Well nourished, well developed, in no acute distress  HEENT: normal  Neck: no JVD, carotid bruits, or masses Cardiac: RRR;  no murmurs, rubs, or gallops,no edema  Respiratory:  clear to auscultation bilaterally, normal work of breathing GI: soft, nontender, nondistended, + BS MS: no deformity or atrophy  Skin: warm and dry, device site well healed Neuro:  Strength and sensation are intact Psych: euthymic mood, full affect  EKG:  EKG is ordered today. Personal review of the ekg ordered shows sinus rhythm, IVCD, QRS 160 ms  Personal review of the device interrogation today. Results in [place]: 07/13/2019: ALT 28; TSH 7.570 07/28/2019: BUN 20; Creatinine, Ser 1.66; Hemoglobin 11.3; NT-Pro BNP 8,503; Platelets 188; Potassium  4.4; Sodium 139    Lipid Panel     Component Value Date/Time   CHOL 86 (L) 07/13/2019 1040   TRIG 63 07/13/2019 1040   HDL 35 (L) 07/13/2019 1040   CHOLHDL 2.5 07/13/2019 1040   LDLCALC 37 07/13/2019 1040     Wt Readings from Last 3 Encounters:  09/08/19 187 lb 3.2 oz (84.9 kg)  07/28/19 191 lb (86.6 kg)  07/13/19 190 lb 3.2 oz (86.3 kg)      Other studies Reviewed: Additional studies/ records that were reviewed today include: Myoview 06/03/17  Review of the above records today demonstrates:   Nuclear stress EF: 6%.  There was no ST segment deviation noted during stress.  Defect 1: There is a large defect of severe severity present in the mid anterior, mid anteroseptal, mid inferoseptal, apical anterior, apical septal, apical inferior, apical lateral and apex location.  Findings consistent with prior myocardial infarction.  This is a high risk study.  The left ventricular ejection fraction is severely decreased (<30%).   High risk stress nuclear study with a very large and severe scar/aneurysm involving the distal 2/3 of the left ventricular myocardium. There is a minimal peri-infarct ischemia, but meaningful areas of reversible ischemia are not identified.   ASSESSMENT AND PLAN:  1.  Chronic systolic heart failure due to ischemic cardiomyopathy:  Currently on Coreg and Entresto.  Status post Medtronic dual-chamber ICD.  Device functioning appropriately.  We have switched her from DDD pacing to MVP to hopefully decrease her RV pacing burden.  This may help with her overall symptomatology.  If it does not, she may be a candidate for CRT upgrade.  2. Coronary artery disease with chronic stable angina: No current chest pain.  Continue current medications.  3. Ventricular tachycardia: Currently on amiodarone.  No further episodes  4.  Syncope: No further episodes  Current medicines are reviewed at length with the patient today.   The patient does not have concerns regarding her medicines.  The following changes were made today: None  Labs/ tests ordered today include:  Orders Placed This Encounter  Procedures  . EKG 12-Lead     Disposition:   FU with Kristalyn Bergstresser 3 months  Signed, Ariell Gunnels Meredith Leeds, MD  09/08/2019 10:32 AM     [facility] [address] [place] [address] [phone] (office) [phone] (fax)

## 2019-09-13 ENCOUNTER — Telehealth: Payer: Self-pay | Admitting: Cardiology

## 2019-09-13 ENCOUNTER — Ambulatory Visit (INDEPENDENT_AMBULATORY_CARE_PROVIDER_SITE_OTHER): Payer: Medicare Other | Admitting: *Deleted

## 2019-09-13 DIAGNOSIS — I472 Ventricular tachycardia, unspecified: Secondary | ICD-10-CM

## 2019-09-13 LAB — CUP PACEART REMOTE DEVICE CHECK
Date Time Interrogation Session: 20201228175836
Implantable Lead Implant Date: 20091013
Implantable Lead Implant Date: 20091013
Implantable Lead Location: 753859
Implantable Lead Location: 753860
Implantable Lead Model: 4076
Implantable Lead Model: 6947
Implantable Pulse Generator Implant Date: 20150521

## 2019-09-13 NOTE — Telephone Encounter (Signed)
Patient was in last week to see Dr. Curt Bears and she has been having SOB. She wants to know if he upped the speed of her pacemaker.

## 2019-09-13 NOTE — Telephone Encounter (Signed)
Patient reports increased SOB when she is walking and active since visit 09/08/19 when her device was reprogrammed to MVP mode to reduce RV pacing burden. No CP, SOB at rest, no palpitations or syncope. ED precautions reviewed. Patient will send remote transmission for review.

## 2019-09-14 NOTE — Telephone Encounter (Signed)
Transmission reviewed. Normal device function. Significant decrease in RV pacing % since reprogramming.  Options are to come back to office and turn MVP off to see if symptoms resolve or give some more time and see how she is feeling next week.  Left message for patient to call back.  Chanetta Marshall, NP 09/14/2019 9:26 AM

## 2019-09-14 NOTE — Telephone Encounter (Signed)
Spoke with patient. She will see how she does over the weekend. If still symptomatic next week, she will call back to make device clinic appointment for device reprogramming.  Chanetta Marshall, NP 09/14/2019 10:04 AM

## 2019-09-16 DIAGNOSIS — I10 Essential (primary) hypertension: Secondary | ICD-10-CM | POA: Diagnosis not present

## 2019-09-27 DIAGNOSIS — A084 Viral intestinal infection, unspecified: Secondary | ICD-10-CM | POA: Diagnosis not present

## 2019-10-13 ENCOUNTER — Ambulatory Visit: Payer: Medicare Other | Admitting: Neurology

## 2019-10-16 DIAGNOSIS — E559 Vitamin D deficiency, unspecified: Secondary | ICD-10-CM | POA: Diagnosis not present

## 2019-10-16 DIAGNOSIS — E039 Hypothyroidism, unspecified: Secondary | ICD-10-CM | POA: Diagnosis not present

## 2019-10-16 DIAGNOSIS — E1165 Type 2 diabetes mellitus with hyperglycemia: Secondary | ICD-10-CM | POA: Diagnosis not present

## 2019-10-18 ENCOUNTER — Other Ambulatory Visit: Payer: Self-pay | Admitting: Cardiology

## 2019-10-29 ENCOUNTER — Other Ambulatory Visit: Payer: Self-pay | Admitting: Cardiology

## 2019-11-14 DIAGNOSIS — K219 Gastro-esophageal reflux disease without esophagitis: Secondary | ICD-10-CM | POA: Diagnosis not present

## 2019-11-14 DIAGNOSIS — E1165 Type 2 diabetes mellitus with hyperglycemia: Secondary | ICD-10-CM | POA: Diagnosis not present

## 2019-11-22 ENCOUNTER — Ambulatory Visit (INDEPENDENT_AMBULATORY_CARE_PROVIDER_SITE_OTHER): Payer: Medicare Other | Admitting: Cardiology

## 2019-11-22 ENCOUNTER — Other Ambulatory Visit: Payer: Self-pay

## 2019-11-22 ENCOUNTER — Encounter: Payer: Self-pay | Admitting: Cardiology

## 2019-11-22 VITALS — BP 116/74 | HR 68 | Ht 64.5 in | Wt 179.2 lb

## 2019-11-22 DIAGNOSIS — Z79899 Other long term (current) drug therapy: Secondary | ICD-10-CM | POA: Diagnosis not present

## 2019-11-22 DIAGNOSIS — I472 Ventricular tachycardia, unspecified: Secondary | ICD-10-CM

## 2019-11-22 DIAGNOSIS — I255 Ischemic cardiomyopathy: Secondary | ICD-10-CM | POA: Diagnosis not present

## 2019-11-22 DIAGNOSIS — Z95810 Presence of automatic (implantable) cardiac defibrillator: Secondary | ICD-10-CM

## 2019-11-22 DIAGNOSIS — I5022 Chronic systolic (congestive) heart failure: Secondary | ICD-10-CM

## 2019-11-22 LAB — HEPATIC FUNCTION PANEL
ALT: 10 IU/L (ref 0–32)
AST: 10 IU/L (ref 0–40)
Albumin: 3.9 g/dL (ref 3.7–4.7)
Alkaline Phosphatase: 104 IU/L (ref 39–117)
Bilirubin Total: 0.9 mg/dL (ref 0.0–1.2)
Bilirubin, Direct: 0.4 mg/dL (ref 0.00–0.40)
Total Protein: 6.5 g/dL (ref 6.0–8.5)

## 2019-11-22 LAB — CUP PACEART INCLINIC DEVICE CHECK
Battery Remaining Longevity: 28 mo
Battery Voltage: 2.94 V
Brady Statistic AP VP Percent: 0.03 %
Brady Statistic AP VS Percent: 87.74 %
Brady Statistic AS VP Percent: 0.01 %
Brady Statistic AS VS Percent: 12.22 %
Brady Statistic RA Percent Paced: 87.76 %
Brady Statistic RV Percent Paced: 0.04 %
Date Time Interrogation Session: 20210308093214
HighPow Impedance: 33 Ohm
HighPow Impedance: 42 Ohm
Implantable Lead Implant Date: 20091013
Implantable Lead Implant Date: 20091013
Implantable Lead Location: 753859
Implantable Lead Location: 753860
Implantable Lead Model: 4076
Implantable Lead Model: 6947
Implantable Pulse Generator Implant Date: 20150521
Lead Channel Impedance Value: 323 Ohm
Lead Channel Impedance Value: 342 Ohm
Lead Channel Impedance Value: 399 Ohm
Lead Channel Pacing Threshold Amplitude: 0.75 V
Lead Channel Pacing Threshold Amplitude: 1 V
Lead Channel Pacing Threshold Pulse Width: 0.4 ms
Lead Channel Pacing Threshold Pulse Width: 0.4 ms
Lead Channel Sensing Intrinsic Amplitude: 16.75 mV
Lead Channel Sensing Intrinsic Amplitude: 2.875 mV
Lead Channel Setting Pacing Amplitude: 1.75 V
Lead Channel Setting Pacing Amplitude: 2 V
Lead Channel Setting Pacing Pulse Width: 0.4 ms
Lead Channel Setting Sensing Sensitivity: 0.3 mV

## 2019-11-22 LAB — TSH: TSH: 11.5 u[IU]/mL — ABNORMAL HIGH (ref 0.450–4.500)

## 2019-11-22 MED ORDER — ENTRESTO 49-51 MG PO TABS: 1.0000 | ORAL_TABLET | Freq: Two times a day (BID) | ORAL | 3 refills | Status: DC

## 2019-11-22 MED ORDER — ENTRESTO 24-26 MG PO TABS: 1.0000 | ORAL_TABLET | Freq: Two times a day (BID) | ORAL | 3 refills | Status: DC

## 2019-11-22 MED ORDER — CARVEDILOL 6.25 MG PO TABS: 6.2500 mg | ORAL_TABLET | Freq: Two times a day (BID) | ORAL | 1 refills | Status: DC

## 2019-11-22 NOTE — Patient Instructions (Addendum)
Medication Instructions:  Your physician has recommended you make the following change in your medication:  1. DECREASE Carvedilol to 6.25 mg twice daily 2. INCREASE Entresto to 24/26 mg twice daily (take 1 whole tablet twice a day) 3. Lasix - take 40 mg daily for the next 4 days, then return to normal dosing.  *If you need a refill on your cardiac medications before your next appointment, please call your pharmacy*   Lab Work: Amiodarone surveillance labs today: TSH & LFTs If you have labs (blood work) drawn today and your tests are completely normal, you will receive your results only by: Marland Kitchen MyChart Message (if you have MyChart) OR . A paper copy in the mail If you have any lab test that is abnormal or we need to change your treatment, we will call you to review the results.   Testing/Procedures: None ordered   Follow-Up: Remote monitoring is used to monitor your Pacemaker of ICD from home. This monitoring reduces the number of office visits required to check your device to one time per year. It allows [place] to keep an eye on the functioning of your device to ensure it is working properly. You are scheduled for a device check from home on 12/13/19. You may send your transmission at any time that day. If you have a wireless device, the transmission will be sent automatically. After your physician reviews your transmission, you will receive a postcard with your next transmission date.   At [facility], you and your health needs are our priority.  As part of our continuing mission to provide you with exceptional heart care, we have created designated Provider Care Teams.  These Care Teams include your primary Cardiologist (physician) and Advanced Practice Providers (APPs -  Physician Assistants and Nurse Practitioners) who all work together to provide you with the care you need, when you need it.  We recommend signing up for the patient portal called "MyChart".  Sign up information is  provided on this After Visit Summary.  MyChart is used to connect with patients for Virtual Visits (Telemedicine).  Patients are able to view lab/test results, encounter notes, upcoming appointments, etc.  Non-urgent messages can be sent to your provider as well.   To learn more about what you can do with MyChart, go to [email or web address].    Your next appointment:   3 month(s)  The format for your next appointment:   In Person  Provider:   Allegra Lai, MD   Thank you for choosing [facility]!!   Trinidad Curet, RN [phone]

## 2019-11-22 NOTE — Progress Notes (Signed)
Electrophysiology Office Note   Date:  11/22/2019   ID:  Jenna West, DOB 1946/01/28, MRN [id number]  PCP:  Ronita Hipps, MD  Cardiologist:  Bettina Gavia Primary Electrophysiologist:  Chace Klippel Meredith Leeds, MD    No chief complaint on file.    History of Present Illness: Jenna West is a 74 y.o. female who is being seen today for the evaluation of CHF at the request of Ronita Hipps, MD. Presenting today for electrophysiology evaluation. History of coronary disease status post PCI to the LAD and RCA in 2000 for an LAD in 2009 with severe LV dysfunction and an EF less than 20%, CHF, hypertension, VT on amiodarone with an ICD, stage IV CKG. She had a Myoview that showed extensive scar and LV dysfunction with an EF of 60%. EF in 2016 was 40%.  Today, denies symptoms of palpitations, chest pain, shortness of breath, orthopnea, PND, lower extremity edema, claudication, dizziness, presyncope, syncope, bleeding, or neurologic sequela. The patient is tolerating medications without difficulties.  She continues to unfortunately have episodes of weakness and fatigue.  She has been having these episodes over the last 2 months.  She was diagnosed with norovirus at approximately that time.  She was having issues for approximately 2 weeks.  At the last visit, her pacing was changed from DDD to MVP to hopefully decrease RV pacing.   Past Medical History:  Diagnosis Date  . Acquired hypothyroidism 05/01/2015  . Angina pectoris ([place]) 07/13/2015  . Aspirin long-term use 03/07/2016  . Benign essential hypertension 05/01/2015  . Bilateral carotid artery stenosis 03/07/2016  . 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 Carotid artery stenosis 06/21/2015  . Coronary arteriosclerosis in native artery 05/01/2015   Overview:  Overview:  PCI and stent to LAD and RCA 2004 for MI DES to LAD for subtotal occlusion with no reflow 8/09  . Coronary artery disease of native artery  of native heart with stable angina pectoris ([place]) 05/01/2015   Overview:  PCI and stent to LAD and RCA 2004 for MI DES to LAD for subtotal occlusion with no reflow 2009  . ICD (implantable cardioverter-defibrillator) in place 05/01/2015   Overview:  Medtronic  . Left heart failure ([place]) 05/01/2015  . MI (myocardial infarction) ([place]) 05/01/2015  . On amiodarone therapy 08/15/2016  . Pure hypercholesterolemia 05/01/2015  . Stage 3 chronic kidney disease 12/18/2016  . Syncope and collapse 05/01/2015  . Type 2 diabetes mellitus with stage 3 chronic kidney disease, with long-term current use of insulin ([place]) 07/17/2016  . VT (ventricular tachycardia) ([place]) 05/01/2015   Past Surgical History:  Procedure Laterality Date  . ABDOMINAL HYSTERECTOMY    . ANKLE FRACTURE SURGERY Bilateral    6 months apart, screws  . CARDIAC CATHETERIZATION     2003, 2009, 2016  . CAROTID STENT  06/21/2015   thoracic ao, 4 vessel cerebral arteriogram & rt internal carotid artery pta/stent CPT 21975  . CATARACT EXTRACTION Right    With lens replaced  . COLONOSCOPY  2013  . CORONARY STENT PLACEMENT    . PACEMAKER PLACEMENT     has two, one working and one not, could not remove old one, Dr. Elonda Husky      Current Outpatient Medications  Medication Sig Dispense Refill  . amiodarone (PACERONE) 200 MG tablet TAKE ONE-HALF TABLET BY  MOUTH ON MONDAY, WEDNESDAY, AND FRIDAY AS DIRECTED 20 tablet 1  . aspirin EC 81 MG tablet  Take 81 mg by mouth daily.    Marland Kitchen atorvastatin (LIPITOR) 80 MG tablet TAKE 1 TABLET BY MOUTH  DAILY 90 tablet 3  . Blood Glucose Monitoring Suppl (ACCU-CHEK AVIVA PLUS) w/Device KIT Apply 1 Dose topically daily.    . clopidogrel (PLAVIX) 75 MG tablet TAKE 1 TABLET BY MOUTH  DAILY 90 tablet 3  . furosemide (LASIX) 20 MG tablet Take 1 tablet daily.  Can take 1 extra tablet daily if weight increases 3 pounds or more. 135 tablet 3  . HUMALOG 100 UNIT/ML injection Inject 25 units in the am, 25 units at lunch, and 25  units at supper    . LANTUS SOLOSTAR 100 UNIT/ML Solostar Pen Inject 12 units at bedtime    . levothyroxine (SYNTHROID, LEVOTHROID) 100 MCG tablet Take 100 mcg by mouth daily before breakfast.     . Multiple Vitamin (MULTI-VITAMINS) TABS Take 1 tablet by mouth daily.    . nitroGLYCERIN (NITROLINGUAL) 0.4 MG/SPRAY spray Place 1 spray under the tongue every 5 (five) minutes x 3 doses as needed for chest pain. 12 g 12  . pantoprazole (PROTONIX) 40 MG tablet Take 40 mg by mouth daily as needed.    . ranolazine (RANEXA) 1000 MG SR tablet Take 1,000 mg by mouth 2 (two) times daily.    . sertraline (ZOLOFT) 50 MG tablet Take 50 mg by mouth daily.    Marland Kitchen spironolactone (ALDACTONE) 25 MG tablet TAKE ONE-HALF TABLET BY  MOUTH DAILY 45 tablet 3  . Vitamin D, Ergocalciferol, (DRISDOL) 1.25 MG (50000 UT) CAPS capsule Take 50,000 Units by mouth every 7 (seven) days.    Marland Kitchen zolpidem (AMBIEN) 10 MG tablet Take 10 mg by mouth at bedtime.    . carvedilol (COREG) 6.25 MG tablet Take 1 tablet (6.25 mg total) by mouth 2 (two) times daily. 180 tablet 1  . sacubitril-valsartan (ENTRESTO) 49-51 MG Take 1 tablet by mouth 2 (two) times daily. 60 tablet 3   No current facility-administered medications for this visit.    Allergies:   Shellfish allergy and Sulfa antibiotics   Social History:  The patient  reports that she has never smoked. She has never used smokeless tobacco. She reports that she does not drink alcohol or use drugs.   Family History:  The patient's family history includes Brain cancer in her mother; Breast cancer in her sister; Diabetes in her sister; Heart attack in her father; Heart disease in her father; Rectal cancer in her daughter.    ROS:  Please see the history of present illness.   Otherwise, review of systems is positive for none.   All other systems are reviewed and negative.   PHYSICAL EXAM: VS:  BP 116/74   Pulse 68   Ht 5' 4.5" (1.638 m)   Wt 179 lb 3.2 oz (81.3 kg)   SpO2 99%   BMI  30.28 kg/m  , BMI Body mass index is 30.28 kg/m. GEN: Well nourished, well developed, in no acute distress  HEENT: normal  Neck: no JVD, carotid bruits, or masses Cardiac:  RRR; no murmurs, rubs, or gallops,no edema  Respiratory:  clear to auscultation bilaterally, normal work of breathing GI: soft, nontender, nondistended, + BS MS: no deformity or atrophy  Skin: warm and dry, device site well healed Neuro:  Strength and sensation are intact Psych: euthymic mood, full affect  EKG:  EKG is ordered today. Personal review of the ekg ordered shows atrial paced, IVCD, QRS 200 ms  Personal review of the  device interrogation today. Results in [place]: 07/13/2019: ALT 28; TSH 7.570 07/28/2019: BUN 20; Creatinine, Ser 1.66; Hemoglobin 11.3; NT-Pro BNP 8,503; Platelets 188; Potassium 4.4; Sodium 139    Lipid Panel     Component Value Date/Time   CHOL 86 (L) 07/13/2019 1040   TRIG 63 07/13/2019 1040   HDL 35 (L) 07/13/2019 1040   CHOLHDL 2.5 07/13/2019 1040   LDLCALC 37 07/13/2019 1040     Wt Readings from Last 3 Encounters:  11/22/19 179 lb 3.2 oz (81.3 kg)  09/08/19 187 lb 3.2 oz (84.9 kg)  07/28/19 191 lb (86.6 kg)      Other studies Reviewed: Additional studies/ records that were reviewed today include: Myoview 06/03/17  Review of the above records today demonstrates:   Nuclear stress EF: 6%.  There was no ST segment deviation noted during stress.  Defect 1: There is a large defect of severe severity present in the mid anterior, mid anteroseptal, mid inferoseptal, apical anterior, apical septal, apical inferior, apical lateral and apex location.  Findings consistent with prior myocardial infarction.  This is a high risk study.  The left ventricular ejection fraction is severely decreased (<30%).   High risk stress nuclear study with a very large and severe scar/aneurysm involving the distal 2/3 of the left ventricular myocardium. There is a minimal  peri-infarct ischemia, but meaningful areas of reversible ischemia are not identified.   ASSESSMENT AND PLAN:  1.  Chronic systolic heart failure due to ischemic cardiomyopathy: Currently on optimal medical therapy with carvedilol and Entresto.  Status post Medtronic dual-chamber ICD.  She has been feeling weak and fatigued.  Due to that, we Camrie Stock cut her carvedilol to 6.25 mg and doubled her Entresto.  She also has evidence of volume overload on device interrogation.  We Laylamarie Meuser have her take Lasix 40 mg a day for the next 4 days and then go back down to her 20 mg a day.  If she continues to have episodes of heart failure, she Anecia Nusbaum need a BiV upgrade.  We Kaitlyn Skowron wait to see if medication changes improve this issue.  2. Coronary artery disease with chronic stable angina: No current chest pain.  Continue with current management.  3. Ventricular tachycardia: Currently on amiodarone without further episodes.  4.  Syncope: No further episodes  Current medicines are reviewed at length with the patient today.   The patient does not have concerns regarding her medicines.  The following changes were made today: Decrease carvedilol, increase Entresto  Labs/ tests ordered today include:  Orders Placed This Encounter  Procedures  . TSH  . Hepatic function panel  . CUP PACEART [place]  . EKG 12-Lead     Disposition:   FU with Brayla Pat 3 months  Signed, Fuller Makin Meredith Leeds, MD  11/22/2019 9:52 AM     [facility] [address] [place] [address] [phone] (office) [phone] (fax)

## 2019-11-22 NOTE — Addendum Note (Signed)
Addended by: Stanton Kidney on: 11/22/2019 10:00 AM   Modules accepted: Orders

## 2019-12-07 DIAGNOSIS — E039 Hypothyroidism, unspecified: Secondary | ICD-10-CM | POA: Diagnosis not present

## 2019-12-07 DIAGNOSIS — E1165 Type 2 diabetes mellitus with hyperglycemia: Secondary | ICD-10-CM | POA: Diagnosis not present

## 2019-12-07 DIAGNOSIS — Z0000 Encounter for general adult medical examination without abnormal findings: Secondary | ICD-10-CM | POA: Diagnosis not present

## 2019-12-07 DIAGNOSIS — Z79899 Other long term (current) drug therapy: Secondary | ICD-10-CM | POA: Diagnosis not present

## 2019-12-07 DIAGNOSIS — E559 Vitamin D deficiency, unspecified: Secondary | ICD-10-CM | POA: Diagnosis not present

## 2019-12-13 ENCOUNTER — Ambulatory Visit (INDEPENDENT_AMBULATORY_CARE_PROVIDER_SITE_OTHER): Payer: Medicare Other | Admitting: *Deleted

## 2019-12-13 DIAGNOSIS — I472 Ventricular tachycardia, unspecified: Secondary | ICD-10-CM

## 2019-12-13 LAB — CUP PACEART REMOTE DEVICE CHECK
Battery Remaining Longevity: 29 mo
Battery Voltage: 2.93 V
Brady Statistic AP VP Percent: 0.04 %
Brady Statistic AP VS Percent: 95.55 %
Brady Statistic AS VP Percent: 0 %
Brady Statistic AS VS Percent: 4.41 %
Brady Statistic RA Percent Paced: 95.55 %
Brady Statistic RV Percent Paced: 0.05 %
Date Time Interrogation Session: 20210329012204
HighPow Impedance: 34 Ohm
HighPow Impedance: 44 Ohm
Implantable Lead Implant Date: 20091013
Implantable Lead Implant Date: 20091013
Implantable Lead Location: 753859
Implantable Lead Location: 753860
Implantable Lead Model: 4076
Implantable Lead Model: 6947
Implantable Pulse Generator Implant Date: 20150521
Lead Channel Impedance Value: 323 Ohm
Lead Channel Impedance Value: 342 Ohm
Lead Channel Impedance Value: 380 Ohm
Lead Channel Pacing Threshold Amplitude: 0.75 V
Lead Channel Pacing Threshold Amplitude: 1.125 V
Lead Channel Pacing Threshold Pulse Width: 0.4 ms
Lead Channel Pacing Threshold Pulse Width: 0.4 ms
Lead Channel Sensing Intrinsic Amplitude: 17.375 mV
Lead Channel Sensing Intrinsic Amplitude: 17.375 mV
Lead Channel Sensing Intrinsic Amplitude: 2.75 mV
Lead Channel Sensing Intrinsic Amplitude: 2.75 mV
Lead Channel Setting Pacing Amplitude: 1.5 V
Lead Channel Setting Pacing Amplitude: 2.25 V
Lead Channel Setting Pacing Pulse Width: 0.4 ms
Lead Channel Setting Sensing Sensitivity: 0.3 mV

## 2019-12-13 NOTE — Progress Notes (Signed)
ICD Remote  

## 2019-12-23 ENCOUNTER — Telehealth: Payer: Self-pay | Admitting: *Deleted

## 2019-12-23 NOTE — Telephone Encounter (Signed)
Followed up w/ pt to see how she is doing on the increased Entresto (as advised on 3/8 OV w/ [place]). Pt reports she is still only taking 1/2 tablet of Entresto 24/26 mg BID.   Pt advised to increase to a whole tablet BID, as advised on 3/8. Pt understands I will follow up in next several weeks to see how she is doing.

## 2020-01-13 ENCOUNTER — Encounter: Payer: Self-pay | Admitting: Cardiology

## 2020-01-13 ENCOUNTER — Other Ambulatory Visit: Payer: Self-pay

## 2020-01-13 ENCOUNTER — Ambulatory Visit: Payer: Medicare Other | Admitting: Cardiology

## 2020-01-13 VITALS — BP 108/62 | HR 71 | Ht 64.5 in | Wt 169.0 lb

## 2020-01-13 DIAGNOSIS — N184 Chronic kidney disease, stage 4 (severe): Secondary | ICD-10-CM

## 2020-01-13 DIAGNOSIS — Z95810 Presence of automatic (implantable) cardiac defibrillator: Secondary | ICD-10-CM | POA: Diagnosis not present

## 2020-01-13 DIAGNOSIS — I255 Ischemic cardiomyopathy: Secondary | ICD-10-CM | POA: Diagnosis not present

## 2020-01-13 DIAGNOSIS — I5022 Chronic systolic (congestive) heart failure: Secondary | ICD-10-CM | POA: Diagnosis not present

## 2020-01-13 DIAGNOSIS — I472 Ventricular tachycardia, unspecified: Secondary | ICD-10-CM

## 2020-01-13 DIAGNOSIS — I130 Hypertensive heart and chronic kidney disease with heart failure and stage 1 through stage 4 chronic kidney disease, or unspecified chronic kidney disease: Secondary | ICD-10-CM

## 2020-01-13 DIAGNOSIS — E782 Mixed hyperlipidemia: Secondary | ICD-10-CM

## 2020-01-13 NOTE — Progress Notes (Signed)
Cardiology Office Note:    Date:  01/13/2020   ID:  DEYSHA CARTIER, DOB 1946/02/16, MRN [id number]  PCP:  Ronita Hipps, MD  Cardiologist:  Shirlee More, MD    Referring MD: Ronita Hipps, MD    ASSESSMENT:    1. Chronic systolic (congestive) heart failure ([place])   2. Ischemic cardiomyopathy   3. VT (ventricular tachycardia) ([place])   4. ICD (implantable cardioverter-defibrillator) in place   5. 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 Mixed hyperlipidemia    PLAN:    In order of problems listed above:  1. Status no fluid overload New York Heart Association class II and she is on maximally tolerated guideline directed treatment limited in the past by orthostatic hypotension I would not try to uptitrate any further and Entresto.  Renal function potassium normal.  She has a severe cardiomyopathy and ICD in place and has had no recent VT VF therapy.  She has marked conduction delay 2. Is my EP colleagues if she would benefit from consideration of CRT 3. Stable CKD 4. Continue amiodarone no evidence of liver or thyroid toxicity 5. Continue statin her lipids are ideal   Next appointment: 4 months   Medication Adjustments/Labs and Tests Ordered: Current medicines are reviewed at length with the patient today.  Concerns regarding medicines are outlined above.  Orders Placed This Encounter  Procedures  . EKG 12-Lead   No orders of the defined types were placed in this encounter.   Chief Complaint  Patient presents with  . Follow-up    6 Month  . Congestive Heart Failure    History of Present Illness:    MAJORIE SANTEE is a 74 y.o. female with a hx of CAD with PCI of LAD and RCA 2004 and LAD 2009 with severe LV dysfunction EF 20-25% and LV aneurysm, CHF, Hypertension, VT on amiodarone and has an ICD, and stage 4 CKD She had an episode of syncope unrelated to arrhythmia on device  interrogation when she followed up with electrophysiology 06/28/2019 and was instructed not to drive for 6 months. Was last seen 07/13/2019. Compliance with diet, lifestyle and medications: Yes recent proBNP level was elevated despite the absence of fluid overload.  Recent labs primary care physician 12/07/2019 shows a cholesterol 87 HDL 31 LDL 44 A1c is quite elevated at 9.2% creatinine 1.60 normal TSH 2.05.  She has liver function tests done that were normal   6 mo ago  (07/13/19) 1 yr ago  (05/27/18) 1 yr ago  (02/11/18)   NT-Pro BNP 0 - 301 pg/mL 6,008High   3,759High  CM  2,749High     Overall she is doing well although she had a recent enterocolitis.  Dr. Curt Bears had increased the dose of her Delene Loll and she has had no orthostatic episodes.  She is fatigued short of breath with more than than usual activity but has had no edema orthopnea shortness of breath with ADLs chest pain palpitation or syncope.  She tolerates her statin without muscle pain or weakness and tolerates her antithrombotic therapy no antiplatelet without bleeding. Past Medical History:  Diagnosis Date  . Acquired hypothyroidism 05/01/2015  . Angina pectoris ([place]) 07/13/2015  . Aspirin long-term use 03/07/2016  . Benign essential hypertension 05/01/2015  . Bilateral carotid artery stenosis 03/07/2016  . Cardiomyopathy ([place]) 05/01/2015   Overview:  EF 20% with aneurysm of the apex  . Carotid  artery stenosis 06/21/2015  . Chronic systolic heart failure ([place]) 05/28/2017  . Coronary arteriosclerosis in native artery 05/01/2015   Overview:  Overview:  PCI and stent to LAD and RCA 2004 for MI DES to LAD for subtotal occlusion with no reflow 8/09  . Coronary artery disease involving native coronary artery of native heart with angina pectoris ([place]) 05/01/2015   Overview:  Overview:  PCI and stent to LAD and RCA 2004 for MI DES to LAD for subtotal occlusion with no reflow 8/09  . Coronary artery disease of native artery of native heart  with stable angina pectoris ([place]) 05/01/2015   Overview:  PCI and stent to LAD and RCA 2004 for MI DES to LAD for subtotal occlusion with no reflow 2009  . Heartburn 05/29/2017  . 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 Hypertensive heart and kidney disease with HF and CKD ([place]) 05/28/2017  . ICD (implantable cardioverter-defibrillator) in place 05/01/2015   Overview:  Medtronic  . Ischemic cardiomyopathy 06/04/2017  . Left heart failure ([place]) 05/01/2015  . MI (myocardial infarction) ([place]) 05/01/2015  . On amiodarone therapy 08/15/2016  . Pure hypercholesterolemia 05/01/2015  . Stage 3 chronic kidney disease 12/18/2016  . Syncope and collapse 05/01/2015  . Type 2 diabetes mellitus with stage 3 chronic kidney disease, with long-term current use of insulin ([place]) 07/17/2016  . VT (ventricular tachycardia) ([place]) 05/01/2015    Past Surgical History:  Procedure Laterality Date  . ABDOMINAL HYSTERECTOMY    . ANKLE FRACTURE SURGERY Bilateral    6 months apart, screws  . CARDIAC CATHETERIZATION     2003, 2009, 2016  . CAROTID STENT  06/21/2015   thoracic ao, 4 vessel cerebral arteriogram & rt internal carotid artery pta/stent CPT 26834  . CATARACT EXTRACTION Right    With lens replaced  . COLONOSCOPY  2013  . CORONARY STENT PLACEMENT    . PACEMAKER PLACEMENT     has two, one working and one not, could not remove old one, Dr. Elonda Husky     Current Medications: Current Meds  Medication Sig  . amiodarone (PACERONE) 200 MG tablet TAKE ONE-HALF TABLET BY  MOUTH ON MONDAY, WEDNESDAY, AND FRIDAY AS DIRECTED  . aspirin EC 81 MG tablet Take 81 mg by mouth daily.  Marland Kitchen atorvastatin (LIPITOR) 80 MG tablet TAKE 1 TABLET BY MOUTH  DAILY  . Blood Glucose Monitoring Suppl (ACCU-CHEK AVIVA PLUS) w/Device KIT Apply 1 Dose topically daily.  . carvedilol (COREG) 6.25 MG tablet Take 1 tablet (6.25 mg total) by mouth 2 (two)  times daily.  . clopidogrel (PLAVIX) 75 MG tablet TAKE 1 TABLET BY MOUTH  DAILY  . furosemide (LASIX) 20 MG tablet Take 1 tablet daily.  Can take 1 extra tablet daily if weight increases 3 pounds or more.  Marland Kitchen HUMALOG 100 UNIT/ML injection Inject 25 units in the am, 25 units at lunch, and 25 units at supper  . LANTUS SOLOSTAR 100 UNIT/ML Solostar Pen Inject 12 units at bedtime  . levothyroxine (SYNTHROID, LEVOTHROID) 100 MCG tablet Take 100 mcg by mouth daily before breakfast.   . Multiple Vitamin (MULTI-VITAMINS) TABS Take 1 tablet by mouth daily.  . nitroGLYCERIN (NITROLINGUAL) 0.4 MG/SPRAY spray Place 1 spray under the tongue every 5 (five) minutes x 3 doses as needed for chest pain.  . pantoprazole (PROTONIX) 40 MG tablet Take 40 mg by mouth daily as needed.  . ranolazine (RANEXA) 1000  MG SR tablet Take 1,000 mg by mouth 2 (two) times daily.  . sacubitril-valsartan (ENTRESTO) 24-26 MG Take 1 tablet by mouth 2 (two) times daily.  . sertraline (ZOLOFT) 50 MG tablet Take 50 mg by mouth daily.  Marland Kitchen spironolactone (ALDACTONE) 25 MG tablet TAKE ONE-HALF TABLET BY  MOUTH DAILY  . Vitamin D, Ergocalciferol, (DRISDOL) 1.25 MG (50000 UT) CAPS capsule Take 50,000 Units by mouth every 7 (seven) days.  Marland Kitchen zolpidem (AMBIEN) 10 MG tablet Take 10 mg by mouth at bedtime.     Allergies:   Shellfish allergy and Sulfa antibiotics   Social History   Socioeconomic History  . Marital status: Married    Spouse name: Not on file  . Number of children: Not on file  . Years of education: Not on file  . Highest education level: Not on file  Occupational History  . Not on file  Tobacco Use  . Smoking status: Never Smoker  . Smokeless tobacco: Never Used  Substance and Sexual Activity  . Alcohol use: No  . Drug use: No  . Sexual activity: Not on file  Other Topics Concern  . Not on file  Social History Narrative  . Not on file   Social Determinants of Health   Financial Resource Strain:   . Difficulty  of Paying Living Expenses:   Food Insecurity:   . Worried About Charity fundraiser in the Last Year:   . Arboriculturist in the Last Year:   Transportation Needs:   . Film/video editor (Medical):   Marland Kitchen Lack of Transportation (Non-Medical):   Physical Activity:   . Days of Exercise per Week:   . Minutes of Exercise per Session:   Stress:   . Feeling of Stress :   Social Connections:   . Frequency of Communication with Friends and Family:   . Frequency of Social Gatherings with Friends and Family:   . Attends Religious Services:   . Active Member of Clubs or Organizations:   . Attends Archivist Meetings:   Marland Kitchen Marital Status:      Family History: The patient's family history includes Brain cancer in her mother; Breast cancer in her sister; Diabetes in her sister; Heart attack in her father; Heart disease in her father; Rectal cancer in her daughter. ROS:   Please see the history of present illness.    All other systems reviewed and are negative.  EKGs/Labs/Other Studies Reviewed:    The following studies were reviewed today:  EKG:  EKG ordered today and personally reviewed.  The ekg ordered today demonstrates paced rhythm.  Wide QRS duration  Recent Labs: 07/28/2019: BUN 20; Creatinine, Ser 1.66; Hemoglobin 11.3; NT-Pro BNP 8,503; Platelets 188; Potassium 4.4; Sodium 139 11/22/2019: ALT 10; TSH 11.500  Recent Lipid Panel    Component Value Date/Time   CHOL 86 (L) 07/13/2019 1040   TRIG 63 07/13/2019 1040   HDL 35 (L) 07/13/2019 1040   CHOLHDL 2.5 07/13/2019 1040   LDLCALC 37 07/13/2019 1040    Physical Exam:    VS:  BP 108/62   Pulse 71   Ht 5' 4.5" (1.638 m)   Wt 169 lb (76.7 kg)   SpO2 98%   BMI 28.56 kg/m     Wt Readings from Last 3 Encounters:  01/13/20 169 lb (76.7 kg)  11/22/19 179 lb 3.2 oz (81.3 kg)  09/08/19 187 lb 3.2 oz (84.9 kg)     GEN: She looks frail well  nourished, well developed in no acute distress HEENT: Normal NECK: No JVD;  No carotid bruits LYMPHATICS: No lymphadenopathy CARDIAC: Soft S1 no S3 RRR, no murmurs, rubs, gallops RESPIRATORY:  Clear to auscultation without rales, wheezing or rhonchi  ABDOMEN: Soft, non-tender, non-distended MUSCULOSKELETAL:  No edema; No deformity  SKIN: Warm and dry NEUROLOGIC:  Alert and oriented x 3 PSYCHIATRIC:  Normal affect    Signed, Shirlee More, MD  01/13/2020 8:39 AM    [place]

## 2020-01-13 NOTE — Patient Instructions (Signed)
Medication Instructions:  Your physician recommends that you continue on your current medications as directed. Please refer to the Current Medication list given to you today.  *If you need a refill on your cardiac medications before your next appointment, please call your pharmacy*   Lab Work: None If you have labs (blood work) drawn today and your tests are completely normal, you will receive your results only by: Marland Kitchen MyChart Message (if you have MyChart) OR . A paper copy in the mail If you have any lab test that is abnormal or we need to change your treatment, we will call you to review the results.   Testing/Procedures: None   Follow-Up: At [facility], you and your health needs are our priority.  As part of our continuing mission to provide you with exceptional heart care, we have created designated Provider Care Teams.  These Care Teams include your primary Cardiologist (physician) and Advanced Practice Providers (APPs -  Physician Assistants and Nurse Practitioners) who all work together to provide you with the care you need, when you need it.  We recommend signing up for the patient portal called "MyChart".  Sign up information is provided on this After Visit Summary.  MyChart is used to connect with patients for Virtual Visits (Telemedicine).  Patients are able to view lab/test results, encounter notes, upcoming appointments, etc.  Non-urgent messages can be sent to your provider as well.   To learn more about what you can do with MyChart, go to [email or web address].    Your next appointment:   4 month(s)  The format for your next appointment:   In Person  Provider:   Shirlee More, MD   Other Instructions

## 2020-01-14 ENCOUNTER — Telehealth: Payer: Self-pay | Admitting: Cardiology

## 2020-01-14 NOTE — Telephone Encounter (Signed)
lmtcb

## 2020-01-14 NOTE — Telephone Encounter (Signed)
Patient states that Jenna West called her yesterday but she was in a room full of people and could not understand her. She was calling to discuss.

## 2020-01-21 NOTE — Telephone Encounter (Signed)
lmtcb

## 2020-01-24 NOTE — Telephone Encounter (Signed)
Pt reports that she has been taking whole tablet Entresto 24/26 mg for past month, since we last spoke. No issues to reports, that she knows of. She does say that her "heart feels like it is slack, I get totally out of breath just walking".  Pt aware what symptoms are r/t.  Aware that we will discuss how things are and if medication change needs to be made (entresto titration).  Will not advise of increasing d/t current issue and was not increased when she saw primary cardiologist, [facility], on 4/29. Pt aware to keep appt on 6/14 w/ Dr. Curt Bears. Patient verbalized understanding and agreeable to plan.   She does reports experiencing Norovirus last month and "hasn't been right since". She lost 30 pds since virus, has no appetite and not eating.  She continues to lose weight and is concerned - "when I eat my stomach hurts".  States PCP didn't think weight loss was a problem. Advised pt to call and discuss continued issues/ concern. Advised to clarify she is not getting better. Pt agreeable to plan.

## 2020-02-02 ENCOUNTER — Other Ambulatory Visit: Payer: Self-pay

## 2020-02-02 ENCOUNTER — Ambulatory Visit: Payer: Medicare Other | Admitting: Neurology

## 2020-02-02 ENCOUNTER — Telehealth: Payer: Self-pay | Admitting: Neurology

## 2020-02-02 ENCOUNTER — Encounter: Payer: Self-pay | Admitting: Neurology

## 2020-02-02 VITALS — BP 155/90 | HR 57 | Ht 65.0 in | Wt 168.3 lb

## 2020-02-02 DIAGNOSIS — R634 Abnormal weight loss: Secondary | ICD-10-CM

## 2020-02-02 DIAGNOSIS — I255 Ischemic cardiomyopathy: Secondary | ICD-10-CM | POA: Diagnosis not present

## 2020-02-02 DIAGNOSIS — Z95810 Presence of automatic (implantable) cardiac defibrillator: Secondary | ICD-10-CM

## 2020-02-02 DIAGNOSIS — Z820 Family history of epilepsy and other diseases of the nervous system: Secondary | ICD-10-CM

## 2020-02-02 DIAGNOSIS — R413 Other amnesia: Secondary | ICD-10-CM

## 2020-02-02 DIAGNOSIS — Z800 Family history of malignant neoplasm of digestive organs: Secondary | ICD-10-CM

## 2020-02-02 DIAGNOSIS — Z0189 Encounter for other specified special examinations: Secondary | ICD-10-CM | POA: Diagnosis not present

## 2020-02-02 DIAGNOSIS — R0683 Snoring: Secondary | ICD-10-CM

## 2020-02-02 NOTE — Telephone Encounter (Signed)
[employer] medicare order sent to GI. No auth they will reach out to the patient to schedule.  

## 2020-02-02 NOTE — Patient Instructions (Signed)
You have complaints of memory loss: memory loss or changes in cognitive function can have many reasons and does not always mean you have dementia. Conditions that can contribute to subjective or objective memory loss include: depression, stress, poor sleep from insomnia or sleep apnea, dehydration, fluctuation in blood sugar values, thyroid or electrolyte dysfunction and certain vitamin deficiencies. Dementia can be caused by stroke, brain atherosclerosis or brain vascular disease due to vascular risk factors (smoking, high blood pressure, high cholesterol, obesity and uncontrolled diabetes), certain degenerative brain disorders (including Parkinson's disease and Multiple sclerosis) and by Alzheimer's disease or other, more rare and sometimes hereditary causes. We will do some additional testing: blood work (which has, in part, been done recently already by Dr. Helene Kelp) and we will do a brain scan. We cannot do an MRI and will do a CT without contrast (due to kidney impairment). We will not start any new medication from my end.   Please talk to Dr. Helene Kelp about your appetite loss, stomach pain, diarrhea, and family history of colon cancer, you have not had a recent endoscopy especially no colonoscopy.  You may need an upper and lower endoscopy, you may benefit from seeing a GI specialist.  Please talk to Dr. Helene Kelp about a referral.

## 2020-02-02 NOTE — Progress Notes (Signed)
Subjective:    Patient ID: Jenna West is a 74 y.o. female.  HPI     Star Age, MD, PhD [facility] [address] [place], [place] [address]  Dear Dr. Helene Kelp,  I saw your patient, [name], upon your kind request in my neurologic clinic today for initial consultation of her memory loss.  The patient is accompanied by her sister, [name], today.  As you know, [name] is a 74 year old right-handed woman with an underlying complex medical history of heart disease with history of MI, ischemic cardiomyopathy, ventricular tachycardia, history of syncope, status post ICD implantation, chronic systolic congestive heart failure, carotid artery stenosis, hypertension, hypothyroidism, hyperlipidemia, uncontrolled DM, low vit. D, and overweight state, who reports Forgetfulness and problems with her memory for the past 2 months.  Her sister requested evaluation based on her observation recently.  Patient spent some time with the sister recently in [place] and patient's sister noted that patient was not eating well, she was at times confused or sluggish in her thinking and forgetful. I reviewed your recent office records.  She had blood work on 12/07/2019, A1c was 9.2 at the time.  She has had impaired kidney function, latest creatinine level was 1.6.  She also had low vitamin D, most recent value was 25, prior to that was 13.  Patient reports appetite loss and her sister is concerned about patient's weight loss of over 30 pounds in the recent few months.  Patient has also fallen several times.  She is followed by cardiology closely, ICD placement originally was over 20 years ago, she had it replaced once.  She may need another wire soon as I understand.  The patient is married and lives with her husband.  She has a Psychiatrist with good relationship to her stepdaughter.  She lost her only biological daughter at age 91 due to colon cancer.  Patient's sister  reports that they have a strong family history of colon cancer and the patient has never had a colonoscopy.  She has had stomach pain and diarrhea and because of her weight loss patient sister is particularly concerned about colon cancer. Of note, the patient does not sleep very well, has not slept well since she lost her daughter.  She has been on Ambien, currently 10 mg each night.  She also is on low-dose sertraline, 50 mg daily.  Patient's sister has sleep apnea.  Patient has never had a sleep study but does snore and has nocturia about once or twice per average night, has had recent morning headaches.  She has never had a brain MRI and no recent head CT.  Patient's sister reports that there is a family history of dementia in the maternal grandmother.  Patient is 1 of a total of 4 girls.  They lost 1 sister at the age of 66. Patient is a non-smoker and does not drink alcohol, they have 1 dog and 2 cats in the household.  She admits that she does not eat very much.  She does not hydrate with water very much.  Her Past Medical History Is Significant For: Past Medical History:  Diagnosis Date  . Acquired hypothyroidism 05/01/2015  . Angina pectoris ([place]) 07/13/2015  . Aspirin long-term use 03/07/2016  . Benign essential hypertension 05/01/2015  . Bilateral carotid artery stenosis 03/07/2016  . 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 Carotid artery stenosis 06/21/2015  . Chronic  systolic heart failure ([place]) 05/28/2017  . Coronary arteriosclerosis in native artery 05/01/2015   Overview:  Overview:  PCI and stent to LAD and RCA 2004 for MI DES to LAD for subtotal occlusion with no reflow 8/09  . Coronary artery disease involving native coronary artery of native heart with angina pectoris ([place]) 05/01/2015   Overview:  Overview:  PCI and stent to LAD and RCA 2004 for MI DES to LAD for subtotal occlusion with no reflow 8/09  . Coronary artery disease of native artery of  native heart with stable angina pectoris ([place]) 05/01/2015   Overview:  PCI and stent to LAD and RCA 2004 for MI DES to LAD for subtotal occlusion with no reflow 2009  . Heartburn 05/29/2017  . 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 Hypertensive heart and kidney disease with HF and CKD ([place]) 05/28/2017  . ICD (implantable cardioverter-defibrillator) in place 05/01/2015   Overview:  [employer]  . Ischemic cardiomyopathy 06/04/2017  . Left heart failure ([place]) 05/01/2015  . MI (myocardial infarction) ([place]) 05/01/2015  . On amiodarone therapy 08/15/2016  . Pure hypercholesterolemia 05/01/2015  . Stage 3 chronic kidney disease 12/18/2016  . Syncope and collapse 05/01/2015  . Type 2 diabetes mellitus with stage 3 chronic kidney disease, with long-term current use of insulin ([place]) 07/17/2016  . VT (ventricular tachycardia) ([place]) 05/01/2015    Her Past Surgical History Is Significant For: Past Surgical History:  Procedure Laterality Date  . ABDOMINAL HYSTERECTOMY    . ANKLE FRACTURE SURGERY Bilateral    6 months apart, screws  . CARDIAC CATHETERIZATION     2003, 2009, 2016  . CAROTID STENT  06/21/2015   thoracic ao, 4 vessel cerebral arteriogram & rt internal carotid artery pta/stent CPT 01751  . CATARACT EXTRACTION Right    With lens replaced  . COLONOSCOPY  2013  . CORONARY STENT PLACEMENT    . PACEMAKER PLACEMENT     has two, one working and one not, could not remove old one, Dr. Elonda Husky     Her Family History Is Significant For: Family History  Problem Relation Age of Onset  . Heart attack Father   . Heart disease Father   . Brain cancer Mother   . Diabetes Sister   . Breast cancer Sister   . Rectal cancer Daughter     Her Social History Is Significant For: Social History   Socioeconomic History  . Marital status: Married    Spouse name: Not on file  . Number of children: Not on file   . Years of education: Not on file  . Highest education level: Not on file  Occupational History  . Not on file  Tobacco Use  . Smoking status: Never Smoker  . Smokeless tobacco: Never Used  Substance and Sexual Activity  . Alcohol use: No  . Drug use: No  . Sexual activity: Not on file  Other Topics Concern  . Not on file  Social History Narrative  . Not on file   Social Determinants of Health   Financial Resource Strain:   . Difficulty of Paying Living Expenses:   Food Insecurity:   . Worried About Charity fundraiser in the Last Year:   . Arboriculturist in the Last Year:   Transportation Needs:   . Film/video editor (Medical):   Marland Kitchen Lack of Transportation (Non-Medical):   Physical Activity:   .  Days of Exercise per Week:   . Minutes of Exercise per Session:   Stress:   . Feeling of Stress :   Social Connections:   . Frequency of Communication with Friends and Family:   . Frequency of Social Gatherings with Friends and Family:   . Attends Religious Services:   . Active Member of Clubs or Organizations:   . Attends Archivist Meetings:   Marland Kitchen Marital Status:     Her Allergies Are:  Allergies  Allergen Reactions  . Shellfish Allergy Swelling    Of mouth only  . Sulfa Antibiotics Other (See Comments)    Internal bleeding  :   Her Current Medications Are:  Outpatient Encounter Medications as of 02/02/2020  Medication Sig  . amiodarone (PACERONE) 200 MG tablet TAKE ONE-HALF TABLET BY  MOUTH ON MONDAY, WEDNESDAY, AND FRIDAY AS DIRECTED  . aspirin EC 81 MG tablet Take 81 mg by mouth daily.  Marland Kitchen atorvastatin (LIPITOR) 80 MG tablet TAKE 1 TABLET BY MOUTH  DAILY  . Blood Glucose Monitoring Suppl (ACCU-CHEK AVIVA PLUS) w/Device KIT Apply 1 Dose topically daily.  . carvedilol (COREG) 12.5 MG tablet Take 12.5 mg by mouth 2 (two) times daily with a meal.  . clopidogrel (PLAVIX) 75 MG tablet TAKE 1 TABLET BY MOUTH  DAILY  . furosemide (LASIX) 20 MG tablet Take 1  tablet daily.  Can take 1 extra tablet daily if weight increases 3 pounds or more.  Marland Kitchen HUMALOG 100 UNIT/ML injection Inject 25 units in the am, 25 units at lunch, and 25 units at supper  . LANTUS SOLOSTAR 100 UNIT/ML Solostar Pen Inject 12 units at bedtime  . levothyroxine (SYNTHROID, LEVOTHROID) 100 MCG tablet Take 100 mcg by mouth daily before breakfast.   . Multiple Vitamin (MULTI-VITAMINS) TABS Take 1 tablet by mouth daily.  . nitroGLYCERIN (NITROLINGUAL) 0.4 MG/SPRAY spray Place 1 spray under the tongue every 5 (five) minutes x 3 doses as needed for chest pain.  . sacubitril-valsartan (ENTRESTO) 24-26 MG Take 1 tablet by mouth 2 (two) times daily.  . sertraline (ZOLOFT) 50 MG tablet Take 50 mg by mouth daily.  Marland Kitchen spironolactone (ALDACTONE) 25 MG tablet TAKE ONE-HALF TABLET BY  MOUTH DAILY  . Vitamin D, Ergocalciferol, (DRISDOL) 1.25 MG (50000 UT) CAPS capsule Take 50,000 Units by mouth every 7 (seven) days.  Marland Kitchen zolpidem (AMBIEN) 10 MG tablet Take 10 mg by mouth at bedtime.  . ranolazine (RANEXA) 1000 MG SR tablet Take 1,000 mg by mouth 2 (two) times daily.  . [DISCONTINUED] carvedilol (COREG) 6.25 MG tablet Take 1 tablet (6.25 mg total) by mouth 2 (two) times daily. (Patient not taking: Reported on 02/02/2020)  . [DISCONTINUED] pantoprazole (PROTONIX) 40 MG tablet Take 40 mg by mouth daily as needed.   No facility-administered encounter medications on file as of 02/02/2020.  :   Review of Systems:  Out of a complete 14 point review of systems, all are reviewed and negative with the exception of these symptoms as listed below:  Review of Systems  Neurological:       Here for evaluation on worsening memory.     Objective:  Neurological Exam  Physical Exam Physical Examination:   Vitals:   02/02/20 1035  BP: (!) 155/90  Pulse: (!) 57   General Examination: The patient is a very pleasant 74 y.o. female in no acute distress. She appears somewhat chronically ill and deconditioned.   She is well-groomed. Somewhat pale.   HEENT: Normocephalic, atraumatic, pupils  are equal, round and reactive to light. Extraocular tracking is good without limitation to gaze excursion or nystagmus noted. Normal smooth pursuit is noted. Hearing is grossly intact. Face is symmetric with normal facial animation and normal facial sensation. Speech is clear with no dysarthria noted. There is no hypophonia. There is an intermittent lower lip and jaw tremor.  She has no head tremor. There are no carotid bruits on auscultation. Oropharynx exam reveals: Mild to moderate mouth dryness, adequate dental hygiene, mild airway crowding secondary to small airway entry and redundant soft palate.  Tonsils are absent.  Tongue protrudes centrally in palate elevates symmetrically.  Mallampati class II.    Chest: Clear to auscultation without wheezing, rhonchi or crackles noted.  Heart: S1+S2+0, regular and normal without murmurs, rubs or gallops noted.   Abdomen: Soft, non-tender and non-distended with normal bowel sounds appreciated on auscultation.  Extremities: There is no pitting edema in the distal lower extremities bilaterally.  Skin: Warm and dry without trophic changes noted.  Musculoskeletal: exam reveals no obvious joint deformities, tenderness or joint swelling or erythema.   Neurologically:  Mental status: The patient is awake, alert and oriented in all 4 spheres. Her immediate and remote memory, attention, language skills and fund of knowledge are mildly impaired, and that the patient is not giving a whole lot of details and the details are provided by her sister.  Also, the patient is not very forthcoming about her history, but does answer questions appropriately. Mood is possibly mildly depressed, affect mildly blunted.  On 02/02/2020: MMSE: 22/30, CDT: 1/4, AFT: 6/min.  Cranial nerves II - XII are as described above under HEENT exam. In addition: shoulder shrug is normal with equal shoulder height  noted. Motor exam: Thin built, global strength of 4+ out of 5, no resting tremor, no significant postural or action tremor, Romberg was not tested due to safety concerns.  Reflexes are 1+ in the upper extremities and knees, trace or perhaps absent in the ankles.  Fine motor skills are globally mildly impaired.  Sensory exam is intact to light touch.  She stands with mild difficulty and has a nonspecific mild gait insecurity, no walking aid.  Sister reports that she recently got the patient a cane which can be folded out into a tripod with seat but sister is more concerned that patient may need a walker.   Assessment and Plan:   In summary, ZION LINT is a very pleasant 74 y.o.-year old female with an underlying complex medical history of heart disease with history of MI, ischemic cardiomyopathy, ventricular tachycardia, history of syncope, status post ICD implantation, chronic systolic congestive heart failure, carotid artery stenosis, hypertension, hypothyroidism, hyperlipidemia, uncontrolled DM, low vit. D, and overweight state, who presents for evaluation of her memory loss of several months duration.  Her sister, who is a Marine scientist, has noticed forgetfulness and sluggish thinking in the recent past when they spent some time together.  The patient has certainly multiple vascular risk factors, also a family history of memory loss, recent decline in overall condition what with her appetite loss and weight loss are also contributors.  She may be at risk for obstructive sleep apnea given her snoring, sleep disturbance and family history of sleep apnea.  We talked about all of these possibilities.  She is strongly encouraged to talk to you about her GI related issues including stomach pain, diarrhea, appetite loss, weight loss and family history of colon cancer.  She has not had a colonoscopy and  may even benefit from an upper and lower GI endoscopy and consultation with GI.  They are encouraged to call your  office for further guidance.  She has a history of vitamin D deficiency, we will check additional labs today including vitamin B12, B1, B6, and we will call her with her results.  We will also proceed with a sleep study to rule out underlying obstructive sleep apnea and she would be willing to try positive airway pressure therapy if the need arises.  She is encouraged to stay well-hydrated, well rested, consider starting to use a walker as she was somewhat unsteady with walking today.  She does not have any evidence of parkinsonism.  She is advised about the importance of good nutrition.  I will order a head CT without contrast, impaired kidney function would not allow for contrast administration safely and because of her ICD she cannot have an MRI unfortunately.  We will keep her posted as to her test results including her blood test results, sleep study results, and scan results by phone call and plan to follow-up after testing.  We may consider medication for dementia in the near future.   I answered all their questions today and the patient and her sister were in agreement.  Thank you very much for allowing me to participate in the care of this nice patient. If I can be of any further assistance to you please do not hesitate to call me at [phone].  Sincerely,   Star Age, MD, PhD

## 2020-02-07 ENCOUNTER — Telehealth: Payer: Self-pay

## 2020-02-07 DIAGNOSIS — R109 Unspecified abdominal pain: Secondary | ICD-10-CM | POA: Diagnosis not present

## 2020-02-07 NOTE — Progress Notes (Signed)
Please advise patient that her labs from 5/19 showed elevated blood sugar and abnormal kidney function. These are not new findings, but issues that will need to be worked on, as we discussed. You can also call her sister, [name], who is an Therapist, sports.

## 2020-02-07 NOTE — Telephone Encounter (Signed)
I called pt's sister. No answer, left a message asking pt to call me back.

## 2020-02-07 NOTE — Telephone Encounter (Signed)
-----   Message from Star Age, MD sent at 02/07/2020  8:04 AM EDT ----- Please advise patient that her labs from 5/19 showed elevated blood sugar and abnormal kidney function. These are not new findings, but issues that will need to be worked on, as we discussed. You can also call her sister, [name], who is an Therapist, sports.

## 2020-02-08 ENCOUNTER — Telehealth: Payer: Self-pay

## 2020-02-08 NOTE — Telephone Encounter (Signed)
LVM for pt to call me back to schedule sleep study  

## 2020-02-09 LAB — COMPREHENSIVE METABOLIC PANEL
ALT: 14 IU/L (ref 0–32)
AST: 14 IU/L (ref 0–40)
Albumin/Globulin Ratio: 1.5 (ref 1.2–2.2)
Albumin: 4 g/dL (ref 3.7–4.7)
Alkaline Phosphatase: 103 IU/L (ref 48–121)
BUN/Creatinine Ratio: 10 — ABNORMAL LOW (ref 12–28)
BUN: 15 mg/dL (ref 8–27)
Bilirubin Total: 1.7 mg/dL — ABNORMAL HIGH (ref 0.0–1.2)
CO2: 20 mmol/L (ref 20–29)
Calcium: 9.3 mg/dL (ref 8.7–10.3)
Chloride: 100 mmol/L (ref 96–106)
Creatinine, Ser: 1.53 mg/dL — ABNORMAL HIGH (ref 0.57–1.00)
GFR calc Af Amer: 38 mL/min/{1.73_m2} — ABNORMAL LOW (ref >59)
GFR calc non Af Amer: 33 mL/min/{1.73_m2} — ABNORMAL LOW (ref >59)
Globulin, Total: 2.7 g/dL (ref 1.5–4.5)
Glucose: 262 mg/dL — ABNORMAL HIGH (ref 65–99)
Potassium: 4.8 mmol/L (ref 3.5–5.2)
Sodium: 137 mmol/L (ref 134–144)
Total Protein: 6.7 g/dL (ref 6.0–8.5)

## 2020-02-09 LAB — CBC WITH DIFFERENTIAL/PLATELET
Basophils Absolute: 0 10*3/uL (ref 0.0–0.2)
Basos: 0 %
EOS (ABSOLUTE): 0 10*3/uL (ref 0.0–0.4)
Eos: 0 %
Hematocrit: 38.2 % (ref 34.0–46.6)
Hemoglobin: 11.6 g/dL (ref 11.1–15.9)
Immature Grans (Abs): 0 10*3/uL (ref 0.0–0.1)
Immature Granulocytes: 0 %
Lymphocytes Absolute: 0.7 10*3/uL (ref 0.7–3.1)
Lymphs: 15 %
MCH: 28 pg (ref 26.6–33.0)
MCHC: 30.4 g/dL — ABNORMAL LOW (ref 31.5–35.7)
MCV: 92 fL (ref 79–97)
Monocytes Absolute: 0.3 10*3/uL (ref 0.1–0.9)
Monocytes: 6 %
Neutrophils Absolute: 3.5 10*3/uL (ref 1.4–7.0)
Neutrophils: 79 %
Platelets: 171 10*3/uL (ref 150–450)
RBC: 4.15 x10E6/uL (ref 3.77–5.28)
RDW: 14.8 % (ref 11.7–15.4)
WBC: 4.5 10*3/uL (ref 3.4–10.8)

## 2020-02-09 LAB — B12 AND FOLATE PANEL
Folate: 3.1 ng/mL (ref >3.0)
Vitamin B-12: 890 pg/mL (ref 232–1245)

## 2020-02-09 LAB — VITAMIN B1: Thiamine: 97.5 nmol/L (ref 66.5–200.0)

## 2020-02-09 LAB — RPR: RPR Ser Ql: NONREACTIVE

## 2020-02-09 LAB — VITAMIN B6

## 2020-02-09 NOTE — Telephone Encounter (Signed)
I contacted the pt's sister [name] and we discussed the recent labs. She verbalized understanding.

## 2020-02-16 ENCOUNTER — Other Ambulatory Visit (HOSPITAL_COMMUNITY): Payer: Self-pay | Admitting: Cardiovascular Disease

## 2020-02-16 ENCOUNTER — Ambulatory Visit (HOSPITAL_COMMUNITY)
Admission: RE | Admit: 2020-02-16 | Discharge: 2020-02-16 | Disposition: A | Discharge status: Home / Self Care | Payer: Medicare Other | Source: Ambulatory Visit | Attending: Cardiology | Admitting: Cardiology

## 2020-02-16 ENCOUNTER — Other Ambulatory Visit: Payer: Self-pay

## 2020-02-16 DIAGNOSIS — I6529 Occlusion and stenosis of unspecified carotid artery: Secondary | ICD-10-CM | POA: Insufficient documentation

## 2020-02-16 DIAGNOSIS — I779 Disorder of arteries and arterioles, unspecified: Secondary | ICD-10-CM

## 2020-02-18 ENCOUNTER — Other Ambulatory Visit: Payer: Self-pay | Admitting: *Deleted

## 2020-02-18 DIAGNOSIS — I6523 Occlusion and stenosis of bilateral carotid arteries: Secondary | ICD-10-CM

## 2020-02-23 ENCOUNTER — Telehealth: Payer: Self-pay | Admitting: Cardiology

## 2020-02-23 ENCOUNTER — Ambulatory Visit
Admission: RE | Admit: 2020-02-23 | Discharge: 2020-02-23 | Disposition: A | Discharge status: Home / Self Care | Payer: Medicare Other | Source: Ambulatory Visit | Attending: Neurology | Admitting: Neurology

## 2020-02-23 DIAGNOSIS — Z800 Family history of malignant neoplasm of digestive organs: Secondary | ICD-10-CM

## 2020-02-23 DIAGNOSIS — R634 Abnormal weight loss: Secondary | ICD-10-CM

## 2020-02-23 DIAGNOSIS — R413 Other amnesia: Secondary | ICD-10-CM | POA: Diagnosis not present

## 2020-02-23 DIAGNOSIS — R0683 Snoring: Secondary | ICD-10-CM

## 2020-02-23 DIAGNOSIS — I255 Ischemic cardiomyopathy: Secondary | ICD-10-CM

## 2020-02-23 DIAGNOSIS — Z820 Family history of epilepsy and other diseases of the nervous system: Secondary | ICD-10-CM

## 2020-02-23 DIAGNOSIS — Z95810 Presence of automatic (implantable) cardiac defibrillator: Secondary | ICD-10-CM

## 2020-02-23 NOTE — Telephone Encounter (Signed)
New message   Patient's sister states that she will be accompanying patient to Dr. Curt Bears visit on 02/28/20. She states that the patient has dimentia.

## 2020-02-23 NOTE — Telephone Encounter (Signed)
Informed ok for sister to accompany pt to appt.

## 2020-02-24 ENCOUNTER — Other Ambulatory Visit: Payer: Self-pay

## 2020-02-24 ENCOUNTER — Ambulatory Visit (INDEPENDENT_AMBULATORY_CARE_PROVIDER_SITE_OTHER): Payer: Medicare Other | Admitting: Neurology

## 2020-02-24 DIAGNOSIS — Z800 Family history of malignant neoplasm of digestive organs: Secondary | ICD-10-CM

## 2020-02-24 DIAGNOSIS — Z95810 Presence of automatic (implantable) cardiac defibrillator: Secondary | ICD-10-CM

## 2020-02-24 DIAGNOSIS — G4733 Obstructive sleep apnea (adult) (pediatric): Secondary | ICD-10-CM

## 2020-02-24 DIAGNOSIS — R0683 Snoring: Secondary | ICD-10-CM

## 2020-02-24 DIAGNOSIS — R413 Other amnesia: Secondary | ICD-10-CM

## 2020-02-24 DIAGNOSIS — Z820 Family history of epilepsy and other diseases of the nervous system: Secondary | ICD-10-CM

## 2020-02-24 DIAGNOSIS — G4720 Circadian rhythm sleep disorder, unspecified type: Secondary | ICD-10-CM

## 2020-02-24 DIAGNOSIS — I255 Ischemic cardiomyopathy: Secondary | ICD-10-CM

## 2020-02-24 DIAGNOSIS — R634 Abnormal weight loss: Secondary | ICD-10-CM

## 2020-02-28 ENCOUNTER — Other Ambulatory Visit: Payer: Self-pay

## 2020-02-28 ENCOUNTER — Encounter: Payer: Self-pay | Admitting: Cardiology

## 2020-02-28 ENCOUNTER — Ambulatory Visit (INDEPENDENT_AMBULATORY_CARE_PROVIDER_SITE_OTHER): Payer: Medicare Other | Admitting: Cardiology

## 2020-02-28 VITALS — BP 124/64 | HR 80 | Ht 65.0 in | Wt 161.4 lb

## 2020-02-28 DIAGNOSIS — I472 Ventricular tachycardia, unspecified: Secondary | ICD-10-CM

## 2020-02-28 DIAGNOSIS — Z79899 Other long term (current) drug therapy: Secondary | ICD-10-CM | POA: Diagnosis not present

## 2020-02-28 DIAGNOSIS — I5022 Chronic systolic (congestive) heart failure: Secondary | ICD-10-CM | POA: Diagnosis not present

## 2020-02-28 DIAGNOSIS — I255 Ischemic cardiomyopathy: Secondary | ICD-10-CM | POA: Diagnosis not present

## 2020-02-28 DIAGNOSIS — Z95810 Presence of automatic (implantable) cardiac defibrillator: Secondary | ICD-10-CM | POA: Diagnosis not present

## 2020-02-28 LAB — CUP PACEART INCLINIC DEVICE CHECK
Battery Remaining Longevity: 29 mo
Battery Voltage: 2.84 V
Brady Statistic AP VP Percent: 0.03 %
Brady Statistic AP VS Percent: 76.9 %
Brady Statistic AS VP Percent: 0.01 %
Brady Statistic AS VS Percent: 23.05 %
Brady Statistic RA Percent Paced: 76.9 %
Brady Statistic RV Percent Paced: 0.04 %
Date Time Interrogation Session: 20210614094313
HighPow Impedance: 34 Ohm
HighPow Impedance: 44 Ohm
Implantable Lead Implant Date: 20091013
Implantable Lead Implant Date: 20091013
Implantable Lead Location: 753859
Implantable Lead Location: 753860
Implantable Lead Model: 4076
Implantable Lead Model: 6947
Implantable Pulse Generator Implant Date: 20150521
Lead Channel Impedance Value: 304 Ohm
Lead Channel Impedance Value: 342 Ohm
Lead Channel Impedance Value: 380 Ohm
Lead Channel Pacing Threshold Amplitude: 0.75 V
Lead Channel Pacing Threshold Amplitude: 1.25 V
Lead Channel Pacing Threshold Pulse Width: 0.4 ms
Lead Channel Pacing Threshold Pulse Width: 0.4 ms
Lead Channel Sensing Intrinsic Amplitude: 18.625 mV
Lead Channel Sensing Intrinsic Amplitude: 3 mV
Lead Channel Setting Pacing Amplitude: 1.5 V
Lead Channel Setting Pacing Amplitude: 2.25 V
Lead Channel Setting Pacing Pulse Width: 0.4 ms
Lead Channel Setting Sensing Sensitivity: 0.3 mV

## 2020-02-28 LAB — COMPREHENSIVE METABOLIC PANEL
ALT: 9 IU/L (ref 0–32)
AST: 15 IU/L (ref 0–40)
Albumin/Globulin Ratio: 1.5 (ref 1.2–2.2)
Albumin: 4 g/dL (ref 3.7–4.7)
Alkaline Phosphatase: 97 IU/L (ref 48–121)
BUN/Creatinine Ratio: 7 — ABNORMAL LOW (ref 12–28)
BUN: 10 mg/dL (ref 8–27)
Bilirubin Total: 1.8 mg/dL — ABNORMAL HIGH (ref 0.0–1.2)
CO2: 22 mmol/L (ref 20–29)
Calcium: 8.7 mg/dL (ref 8.7–10.3)
Chloride: 99 mmol/L (ref 96–106)
Creatinine, Ser: 1.35 mg/dL — ABNORMAL HIGH (ref 0.57–1.00)
GFR calc Af Amer: 45 mL/min/{1.73_m2} — ABNORMAL LOW (ref >59)
GFR calc non Af Amer: 39 mL/min/{1.73_m2} — ABNORMAL LOW (ref >59)
Globulin, Total: 2.6 g/dL (ref 1.5–4.5)
Glucose: 217 mg/dL — ABNORMAL HIGH (ref 65–99)
Potassium: 4 mmol/L (ref 3.5–5.2)
Sodium: 138 mmol/L (ref 134–144)
Total Protein: 6.6 g/dL (ref 6.0–8.5)

## 2020-02-28 LAB — TSH: TSH: 9.12 u[IU]/mL — ABNORMAL HIGH (ref 0.450–4.500)

## 2020-02-28 LAB — MAGNESIUM: Magnesium: 1.8 mg/dL (ref 1.6–2.3)

## 2020-02-28 NOTE — Patient Instructions (Signed)
Medication Instructions:  Your physician recommends that you continue on your current medications as directed. Please refer to the Current Medication list given to you today.  *If you need a refill on your cardiac medications before your next appointment, please call your pharmacy*   Lab Work: Today: CMET,  Magnesium level & TSH If you have labs (blood work) drawn today and your tests are completely normal, you will receive your results only by: Marland Kitchen MyChart Message (if you have MyChart) OR . A paper copy in the mail If you have any lab test that is abnormal or we need to change your treatment, we will call you to review the results.   Testing/Procedures: None ordered   Follow-Up: Remote monitoring is used to monitor your Pacemaker of ICD from home. This monitoring reduces the number of office visits required to check your device to one time per year. It allows [place] to keep an eye on the functioning of your device to ensure it is working properly. You are scheduled for a device check from home on 03/13/2020, 06/12/2020. You may send your transmission at any time that day. If you have a wireless device, the transmission will be sent automatically. After your physician reviews your transmission, you will receive a postcard with your next transmission date.  At [facility], you and your health needs are our priority.  As part of our continuing mission to provide you with exceptional heart care, we have created designated Provider Care Teams.  These Care Teams include your primary Cardiologist (physician) and Advanced Practice Providers (APPs -  Physician Assistants and Nurse Practitioners) who all work together to provide you with the care you need, when you need it.  We recommend signing up for the patient portal called "MyChart".  Sign up information is provided on this After Visit Summary.  MyChart is used to connect with patients for Virtual Visits (Telemedicine).  Patients are able to view  lab/test results, encounter notes, upcoming appointments, etc.  Non-urgent messages can be sent to your provider as well.   To learn more about what you can do with MyChart, go to [email or web address].    Your next appointment:   3 month(s)  The format for your next appointment:   In Person  Provider:   Allegra Lai, MD   Thank you for choosing [facility]!!   Trinidad Curet, RN [phone]    Other Instructions

## 2020-02-28 NOTE — Progress Notes (Signed)
Electrophysiology Office Note   Date:  02/28/2020   ID:  Jenna West, DOB [birth year], MRN [id number]  PCP:  Richardo Priest, MD  Cardiologist:  Bettina Gavia Primary Electrophysiologist:  Constance Haw, MD    No chief complaint on file.    History of Present Illness: Jenna West is a 74 y.o. female who is being seen today for the evaluation of CHF at the request of Ronita Hipps, MD. Presenting today for electrophysiology evaluation. History of coronary disease status post PCI to the LAD and RCA in 2000 for an LAD in 2009 with severe LV dysfunction and an EF less than 20%, CHF, hypertension, VT on amiodarone with an ICD, stage IV CKG. She had a Myoview that showed extensive scar and LV dysfunction with an EF of 60%. EF in 2016 was 40%.  Today, denies symptoms of palpitations, chest pain, shortness of breath, orthopnea, PND, lower extremity edema, claudication, dizziness, presyncope, syncope, bleeding, or neurologic sequela. The patient is tolerating medications without difficulties.  She has been having significant issues over the last few weeks to months.  She has had a few falls and has hit her head.  She has follow-up with neurology scheduled.  She also has not been eating or drinking, with a lot of nausea and diarrhea.  Her sister is worried that this could be due to colon cancer as it does not run in her family and she has never had a colonoscopy.  Her device interrogation shows a high burden of VT, both nonsustained and sustained, treated with ATP but no ICD shocks.  She does complain of some dizziness.  Past Medical History:  Diagnosis Date  . Acquired hypothyroidism 05/01/2015  . Angina pectoris ([place]) 07/13/2015  . Aspirin long-term use 03/07/2016  . Benign essential hypertension 05/01/2015  . Bilateral carotid artery stenosis 03/07/2016  . 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 Carotid artery stenosis 06/21/2015  . Chronic systolic  heart failure ([place]) 05/28/2017  . Coronary arteriosclerosis in native artery 05/01/2015   Overview:  Overview:  PCI and stent to LAD and RCA 2004 for MI DES to LAD for subtotal occlusion with no reflow 8/09  . Coronary artery disease involving native coronary artery of native heart with angina pectoris ([place]) 05/01/2015   Overview:  Overview:  PCI and stent to LAD and RCA 2004 for MI DES to LAD for subtotal occlusion with no reflow 8/09  . Coronary artery disease of native artery of native heart with stable angina pectoris ([place]) 05/01/2015   Overview:  PCI and stent to LAD and RCA 2004 for MI DES to LAD for subtotal occlusion with no reflow 2009  . Heartburn 05/29/2017  . 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 ICD (implantable cardioverter-defibrillator) in place 05/01/2015   Overview:  Medtronic  . Ischemic cardiomyopathy 06/04/2017  . Left heart failure ([place]) 05/01/2015  . MI (myocardial infarction) ([place]) 05/01/2015  . On amiodarone therapy 08/15/2016  . Pure hypercholesterolemia 05/01/2015  . Stage 3 chronic kidney disease 12/18/2016  . Syncope and collapse 05/01/2015  . Type 2 diabetes mellitus with stage 3 chronic kidney disease, with long-term current use of insulin ([place]) 07/17/2016  . VT (ventricular tachycardia) ([place]) 05/01/2015   Past Surgical History:  Procedure Laterality Date  .  ABDOMINAL HYSTERECTOMY    . ANKLE FRACTURE SURGERY Bilateral    6 months apart, screws  . CARDIAC CATHETERIZATION     2003, 2009, 2016  . CAROTID STENT  06/21/2015   thoracic ao, 4 vessel cerebral arteriogram & rt internal carotid artery pta/stent CPT 50354  . CATARACT EXTRACTION Right    With lens replaced  . COLONOSCOPY  2013  . CORONARY STENT PLACEMENT    . PACEMAKER PLACEMENT     has two, one working and one not, could not remove old  one, Dr. Elonda Husky      Current Outpatient Medications  Medication Sig Dispense Refill  . amiodarone (PACERONE) 200 MG tablet TAKE ONE-HALF TABLET BY  MOUTH ON MONDAY, WEDNESDAY, AND FRIDAY AS DIRECTED 20 tablet 1  . aspirin EC 81 MG tablet Take 81 mg by mouth daily.    Marland Kitchen atorvastatin (LIPITOR) 80 MG tablet TAKE 1 TABLET BY MOUTH  DAILY 90 tablet 3  . Blood Glucose Monitoring Suppl (ACCU-CHEK AVIVA PLUS) w/Device KIT Apply 1 Dose topically daily.    . carvedilol (COREG) 12.5 MG tablet Take 12.5 mg by mouth 2 (two) times daily with a meal.    . clopidogrel (PLAVIX) 75 MG tablet TAKE 1 TABLET BY MOUTH  DAILY 90 tablet 3  . furosemide (LASIX) 20 MG tablet Take 1 tablet daily.  Can take 1 extra tablet daily if weight increases 3 pounds or more. 135 tablet 3  . HUMALOG 100 UNIT/ML injection Inject 25 units in the am, 25 units at lunch, and 25 units at supper    . LANTUS SOLOSTAR 100 UNIT/ML Solostar Pen Inject 12 units at bedtime    . levothyroxine (SYNTHROID, LEVOTHROID) 100 MCG tablet Take 100 mcg by mouth daily before breakfast.     . Multiple Vitamin (MULTI-VITAMINS) TABS Take 1 tablet by mouth daily.    . nitroGLYCERIN (NITROLINGUAL) 0.4 MG/SPRAY spray Place 1 spray under the tongue every 5 (five) minutes x 3 doses as needed for chest pain. 12 g 12  . sacubitril-valsartan (ENTRESTO) 24-26 MG Take 1 tablet by mouth 2 (two) times daily. 60 tablet 3  . sertraline (ZOLOFT) 50 MG tablet Take 50 mg by mouth daily.    Marland Kitchen spironolactone (ALDACTONE) 25 MG tablet TAKE ONE-HALF TABLET BY  MOUTH DAILY 45 tablet 3  . Vitamin D, Ergocalciferol, (DRISDOL) 1.25 MG (50000 UT) CAPS capsule Take 50,000 Units by mouth every 7 (seven) days.    Marland Kitchen zolpidem (AMBIEN) 10 MG tablet Take 10 mg by mouth at bedtime.    . ranolazine (RANEXA) 1000 MG SR tablet Take 1,000 mg by mouth 2 (two) times daily.     No current facility-administered medications for this visit.    Allergies:   Shellfish allergy and Sulfa antibiotics    Social History:  The patient  reports that she has never smoked. She has never used smokeless tobacco. She reports that she does not drink alcohol and does not use drugs.   Family History:  The patient's family history includes Brain cancer in her mother; Breast cancer in her sister; Diabetes in her sister; Heart attack in her father; Heart disease in her father; Rectal cancer in her daughter.   ROS:  Please see the history of present illness.   Otherwise, review of systems is positive for none.   All other systems are reviewed and negative.   PHYSICAL EXAM: VS:  BP 124/64   Pulse 80   Ht '5\' 5"'  (1.651 m)   Wt  161 lb 6.4 oz (73.2 kg)   SpO2 98%   BMI 26.86 kg/m  , BMI Body mass index is 26.86 kg/m. GEN: Well nourished, well developed, in no acute distress  HEENT: normal  Neck: no JVD, carotid bruits, or masses Cardiac: RRR; no murmurs, rubs, or gallops,no edema  Respiratory:  clear to auscultation bilaterally, normal work of breathing GI: soft, nontender, nondistended, + BS MS: no deformity or atrophy  Skin: warm and dry, device site well healed Neuro:  Strength and sensation are intact Psych: euthymic mood, full affect  EKG:  EKG is ordered today. Personal review of the ekg ordered shows sinus rhythm, IVCD, rate 80, QRS 162 ms  Personal review of the device interrogation today. Results in [place]: 07/28/2019: NT-Pro BNP 8,503 11/22/2019: TSH 11.500 02/02/2020: ALT 14; BUN 15; Creatinine, Ser 1.53; Hemoglobin 11.6; Platelets 171; Potassium 4.8; Sodium 137    Lipid Panel     Component Value Date/Time   CHOL 86 (L) 07/13/2019 1040   TRIG 63 07/13/2019 1040   HDL 35 (L) 07/13/2019 1040   CHOLHDL 2.5 07/13/2019 1040   LDLCALC 37 07/13/2019 1040     Wt Readings from Last 3 Encounters:  02/28/20 161 lb 6.4 oz (73.2 kg)  02/02/20 168 lb 5 oz (76.3 kg)  01/13/20 169 lb (76.7 kg)      Other studies Reviewed: Additional studies/ records that were reviewed  today include: Myoview 06/03/17  Review of the above records today demonstrates:   Nuclear stress EF: 6%.  There was no ST segment deviation noted during stress.  Defect 1: There is a large defect of severe severity present in the mid anterior, mid anteroseptal, mid inferoseptal, apical anterior, apical septal, apical inferior, apical lateral and apex location.  Findings consistent with prior myocardial infarction.  This is a high risk study.  The left ventricular ejection fraction is severely decreased (<30%).   High risk stress nuclear study with a very large and severe scar/aneurysm involving the distal 2/3 of the left ventricular myocardium. There is a minimal peri-infarct ischemia, but meaningful areas of reversible ischemia are not identified.   ASSESSMENT AND PLAN:  1.  Chronic systolic heart failure due to ischemic cardiomyopathy: Currently on optimal medical therapy with carvedilol and Entresto.  Status post Medtronic dual-chamber ICD.  No obvious volume overload.  2. Coronary artery disease with chronic stable angina: No current chest pain.  Continue with current management.  3. Ventricular tachycardia: Unfortunately she has had more frequent episodes of VT over the last few weeks to months.  Multiple of been treated with ATP, though no ICD shocks.  She is having quite a bit of diarrhea and vomiting and has not been eating well.  We Ameilia Rattan check electrolytes to see if this could be part of the issue.  May need to increase her amiodarone dose.  Also adjusted her defibrillator to include a slow VT at 136 bpm with ATP and shock  4.  Syncope: Has continued to have episodes.  Not obviously due to her VT.  Reminded of no driving for 6 months.  Case discussed with primary cardiology  Current medicines are reviewed at length with the patient today.   The patient does not have concerns regarding her medicines.  The following changes were made today: None  Labs/ tests ordered today  include:  Orders Placed This Encounter  Procedures  . Magnesium  . TSH  . Comp Met (CMET)  . EKG 12-Lead  Disposition:   FU with Branae Crail 3 months  Signed, Rexine Gowens Meredith Leeds, MD  02/28/2020 9:17 AM     [facility] [address] [place]   [id number] [phone] (office) [phone] (fax)

## 2020-03-01 ENCOUNTER — Telehealth: Payer: Self-pay | Admitting: *Deleted

## 2020-03-01 NOTE — Telephone Encounter (Signed)
Spoke with husband, on [place] and informed him the patient's  CT scan was read as normal. He verbalized understanding, appreciation.

## 2020-03-02 NOTE — Addendum Note (Signed)
Addended by: Star Age on: 03/02/2020 07:00 PM   Modules accepted: Orders

## 2020-03-02 NOTE — Progress Notes (Signed)
Patient referred by Dr. Helene Kelp (for memory loss), seen by me on 02/02/20, diagnostic PSG on 02/24/20.   Please call and notify the patient that the recent sleep study showed moderate obstructive sleep apnea. I recommend treatment for this in the form of CPAP. This will require a repeat sleep study for proper titration and mask fitting and correct monitoring of the oxygen saturations. Please explain to patient. I have placed an order in the chart. Thanks.  Star Age, MD, PhD [facility])

## 2020-03-02 NOTE — Procedures (Signed)
PATIENT'S NAME:  Jenna West, Jenna West DOB:      11-07-1945      MR#:    [id number]     DATE OF RECORDING: 02/24/2020 REFERRING M.D.:  Kennith Maes, MD Study Performed:   Baseline Polysomnogram HISTORY: 74 year old woman with a history of heart disease with history of MI, ischemic cardiomyopathy, ventricular tachycardia, history of syncope, status post ICD implantation, chronic systolic congestive heart failure, carotid artery stenosis, hypertension, hypothyroidism, hyperlipidemia, uncontrolled DM, low vit. D, and overweight state, who reports forgetfulness and problems with her memory for the past 2 months. She snores and does not sleep well. The patient's weight 168 pounds with a height of 65 (inches), resulting in a BMI of 27.9 kg/m2.   CURRENT MEDICATIONS: Pacerone, Aspirin, Lipitor, Coreg, Plavix, Lasix, Humalog, Lantus Solostar, Synthroid, Multivitamins, Nitrolingual, Entresto, Zoloft, Aldactone, Drisdol, Ambien, Ranexa   PROCEDURE:  This is a multichannel digital polysomnogram utilizing the Somnostar 11.2 system.  Electrodes and sensors were applied and monitored per AASM Specifications.   EEG, EOG, Chin and Limb EMG, were sampled at 200 Hz.  ECG, Snore and Nasal Pressure, Thermal Airflow, Respiratory Effort, CPAP Flow and Pressure, Oximetry was sampled at 50 Hz. Digital video and audio were recorded.      BASELINE STUDY  Lights Out was at 20:45 and Lights On at 04:30.  Total recording time (TRT) was 465.5 minutes, with a total sleep time (TST) of 449.5 minutes.   The patient's sleep latency was 0.5 minutes.  REM sleep was absent.  The sleep efficiency was 96.6 %.     SLEEP ARCHITECTURE: WASO (Wake after sleep onset) was 15.5 minutes.  There were 1 minutes in Stage N1, 448.5 minutes Stage N2, 0 minutes Stage N3 and 0 minutes in Stage REM.  The percentage of Stage N1 was .2%, Stage N2 was 99.8%, which is markedly increased, Stage N3 and Stage R (REM sleep) were absent. The arousals were noted as: 74 were  spontaneous, 27 were associated with PLMs, 21 were associated with respiratory events.  RESPIRATORY ANALYSIS:  There were a total of 156 respiratory events:  73 obstructive apneas, 1 central apneas and 1 mixed apneas with a total of 75 apneas and an apnea index (AI) of 10. /hour. There were 81 hypopneas with a hypopnea index of 10.8 /hour. The patient also had 0 respiratory event related arousals (RERAs).      The total APNEA/HYPOPNEA INDEX (AHI) was 20.8/hour and the total RESPIRATORY DISTURBANCE INDEX was  20.8 /hour.  0 events occurred in REM sleep and 164 events in NREM. The REM AHI was n/a, versus a non-REM AHI of 20.8. The patient spent 1 minutes of total sleep time in the supine position and 449 minutes in non-supine.. The supine AHI was n/a versus a non-supine AHI of 20.8.  OXYGEN SATURATION & C02:  The Wake baseline 02 saturation was 91%, with the lowest being 74%. Time spent below 89% saturation equaled 235 minutes.  PERIODIC LIMB MOVEMENTS:   The patient had a total of 397 Periodic Limb Movements.  The Periodic Limb Movement (PLM) index was 53. and the PLM Arousal index was 3.6/hour.  Audio and video analysis did not show any abnormal or unusual movements, behaviors, phonations or vocalizations. The patient took no bathroom breaks. Mild to moderate snoring was noted. The EKG showed a wide QRS complex. She has a ICD in place.   Post-study, the patient indicated that sleep was worse than usual.   IMPRESSION:  1. Obstructive Sleep Apnea (OSA)  2. Dysfunctions associated with sleep stages or arousal from sleep  RECOMMENDATIONS:  1. This study demonstrates moderate obstructive sleep apnea, with a total AHI of 20.8/hour and O2 nadir of 74%. The absence of REM sleep and near-absence of supine sleep likely underestimate her sleep disordered breathing. Treatment with positive airway pressure in the form of CPAP is recommended. This will require a full night titration study to optimize therapy.  Other treatment options may include avoidance of supine sleep position along with weight loss, upper airway or jaw surgery in selected patients or the use of an oral appliance in certain patients. ENT evaluation and/or consultation with a maxillofacial surgeon or dentist may be feasible in some instances.    2. Please note that untreated obstructive sleep apnea may carry additional perioperative morbidity. Patients with significant obstructive sleep apnea should receive perioperative PAP therapy and the surgeons and particularly the anesthesiologist should be informed of the diagnosis and the severity of the sleep disordered breathing. 3. This study shows abnormal sleep stage percentages; these are nonspecific findings and per se do not signify an intrinsic sleep disorder or a cause for the patient's sleep-related symptoms. Causes include (but are not limited to) the first night effect of the sleep study, circadian rhythm disturbances, medication effect or an underlying mood disorder or medical problem.  4. The patient should be cautioned not to drive, work at heights, or operate dangerous or heavy equipment when tired or sleepy. Review and reiteration of good sleep hygiene measures should be pursued with any patient. 5. The patient will be seen in follow-up in the sleep clinic at [facility] for discussion of the test results, symptom and treatment compliance review, further management strategies, etc. The referring provider will be notified of the test results.  I certify that I have reviewed the entire raw data recording prior to the issuance of this report in accordance with the Standards of Accreditation of the American Academy of Sleep Medicine (AASM)  Star Age, MD, PhD Diplomat, American Board of Neurology and Sleep Medicine (Neurology and Sleep Medicine)

## 2020-03-08 ENCOUNTER — Telehealth: Payer: Self-pay

## 2020-03-08 NOTE — Telephone Encounter (Signed)
I called pt. I advised pt that Dr. Rexene Alberts reviewed their sleep study results and found that moderate osa was found and recommends that pt be treated with a cpap. Dr. Rexene Alberts recommends that pt return for a repeat sleep study in order to properly titrate the cpap and ensure a good mask fit. Pt is agreeable to returning for a titration study. I advised pt that our sleep lab will file with pt's insurance and call pt to schedule the sleep study when we hear back from the pt's insurance regarding coverage of this sleep study. Pt verbalized understanding of results. Pt had no questions at this time but was encouraged to call back if questions arise.

## 2020-03-08 NOTE — Telephone Encounter (Signed)
-----   Message from Star Age, MD sent at 03/02/2020  7:00 PM EDT ----- Patient referred by Dr. Helene Kelp (for memory loss), seen by me on 02/02/20, diagnostic PSG on 02/24/20.   Please call and notify the patient that the recent sleep study showed moderate obstructive sleep apnea. I recommend treatment for this in the form of CPAP. This will require a repeat sleep study for proper titration and mask fitting and correct monitoring of the oxygen saturations. Please explain to patient. I have placed an order in the chart. Thanks.  Star Age, MD, PhD [facility])

## 2020-03-11 ENCOUNTER — Other Ambulatory Visit: Payer: Self-pay | Admitting: Cardiology

## 2020-03-13 ENCOUNTER — Ambulatory Visit (INDEPENDENT_AMBULATORY_CARE_PROVIDER_SITE_OTHER): Payer: Medicare Other | Admitting: *Deleted

## 2020-03-13 DIAGNOSIS — I255 Ischemic cardiomyopathy: Secondary | ICD-10-CM | POA: Diagnosis not present

## 2020-03-13 LAB — CUP PACEART REMOTE DEVICE CHECK
Battery Remaining Longevity: 27 mo
Battery Voltage: 2.93 V
Brady Statistic AP VP Percent: 0.02 %
Brady Statistic AP VS Percent: 34.7 %
Brady Statistic AS VP Percent: 0.02 %
Brady Statistic AS VS Percent: 65.26 %
Brady Statistic RA Percent Paced: 34.67 %
Brady Statistic RV Percent Paced: 0.04 %
Date Time Interrogation Session: 20210628012502
HighPow Impedance: 35 Ohm
HighPow Impedance: 40 Ohm
Implantable Lead Implant Date: 20091013
Implantable Lead Implant Date: 20091013
Implantable Lead Location: 753859
Implantable Lead Location: 753860
Implantable Lead Model: 4076
Implantable Lead Model: 6947
Implantable Pulse Generator Implant Date: 20150521
Lead Channel Impedance Value: 304 Ohm
Lead Channel Impedance Value: 323 Ohm
Lead Channel Impedance Value: 380 Ohm
Lead Channel Pacing Threshold Amplitude: 0.625 V
Lead Channel Pacing Threshold Amplitude: 1.125 V
Lead Channel Pacing Threshold Pulse Width: 0.4 ms
Lead Channel Pacing Threshold Pulse Width: 0.4 ms
Lead Channel Sensing Intrinsic Amplitude: 16.75 mV
Lead Channel Sensing Intrinsic Amplitude: 16.75 mV
Lead Channel Sensing Intrinsic Amplitude: 2.75 mV
Lead Channel Sensing Intrinsic Amplitude: 2.75 mV
Lead Channel Setting Pacing Amplitude: 1.5 V
Lead Channel Setting Pacing Amplitude: 2.25 V
Lead Channel Setting Pacing Pulse Width: 0.4 ms
Lead Channel Setting Sensing Sensitivity: 0.3 mV

## 2020-03-14 NOTE — Progress Notes (Signed)
Remote ICD transmission.   

## 2020-03-17 ENCOUNTER — Telehealth: Payer: Self-pay | Admitting: *Deleted

## 2020-03-17 DIAGNOSIS — Z79899 Other long term (current) drug therapy: Secondary | ICD-10-CM

## 2020-03-17 DIAGNOSIS — I5022 Chronic systolic (congestive) heart failure: Secondary | ICD-10-CM

## 2020-03-17 MED ORDER — AMIODARONE HCL 200 MG PO TABS
200.0000 mg | ORAL_TABLET | Freq: Every day | ORAL | 1 refills | Status: DC
Start: 2020-03-17 — End: 2020-05-26

## 2020-03-17 NOTE — Telephone Encounter (Signed)
-----   Message from Will Meredith Leeds, MD sent at 03/13/2020  3:42 PM EDT ----- Abnormal device interrogation reviewed.  Lead parameters and battery status stable.  Multiple episodes of VT noted.  Increase amiodarone to 200 mg a day.  Also has evidence of volume overload which is likely contributing.  Increase Lasix to 40 mg a day for the next week with a basic metabolic in 1 week.

## 2020-03-17 NOTE — Telephone Encounter (Signed)
Informed patient's sister, ok per pt, of results and verbal understanding expressed. Pt will stop by the [place] office next Friday for follow up lab work.

## 2020-03-21 DIAGNOSIS — Z800 Family history of malignant neoplasm of digestive organs: Secondary | ICD-10-CM | POA: Diagnosis not present

## 2020-03-21 DIAGNOSIS — R109 Unspecified abdominal pain: Secondary | ICD-10-CM | POA: Diagnosis not present

## 2020-03-21 DIAGNOSIS — R110 Nausea: Secondary | ICD-10-CM | POA: Diagnosis not present

## 2020-03-21 DIAGNOSIS — R197 Diarrhea, unspecified: Secondary | ICD-10-CM | POA: Diagnosis not present

## 2020-03-22 ENCOUNTER — Telehealth: Payer: Self-pay | Admitting: Cardiology

## 2020-03-22 NOTE — Telephone Encounter (Signed)
Harmonii is calling stating she had her blood work done in [place] yesterday while at her appointment for her coloscopy and they stated she would not need to go to the [place] office on Friday as advised by Dr. Curt Bears due to this. Venia just wanted to make sure Dr. Shonna Chock is aware of this and agrees since the blood work was for a medication change. Please advise.

## 2020-03-23 ENCOUNTER — Telehealth: Payer: Self-pay | Admitting: *Deleted

## 2020-03-23 NOTE — Telephone Encounter (Signed)
   [facility] Pre-operative Risk Assessment    HEARTCARE STAFF: - Please ensure there is not already an duplicate clearance open for this procedure. - Under Visit Info/Reason for Call, type in Other and utilize the format Clearance MM/DD/YY or Clearance TBD. Do not use dashes or single digits. - If request is for dental extraction, please clarify the # of teeth to be extracted.  Request for surgical clearance:  1. What type of surgery is being performed? COLONOSCOPY   2. When is this surgery scheduled? TBD   3. What type of clearance is required (medical clearance vs. Pharmacy clearance to hold med vs. Both)? MEDICAL  4. Are there any medications that need to be held prior to surgery and how long? PLAVIX x 5 DAYS PRIOR TO PROCEDURE  5. Practice name and name of physician performing surgery? [facility]; I LEFT VM WITH REQUESTING OFFICE TO PLEASE CALL BACK WITH NAME OF MD WHO WILL BE PERFORMING PROCEDURE  6. What is the office phone number? [phone]   7.   What is the office fax number? [phone]  8.   Anesthesia type (None, local, MAC, general) ? NOT LISTED   Julaine Hua 03/23/2020, 1:48 PM  _________________________________________________________________   (provider comments below)

## 2020-03-24 ENCOUNTER — Telehealth: Payer: Self-pay | Admitting: Cardiology

## 2020-03-24 NOTE — Telephone Encounter (Signed)
Left message for the patient to call back and speak to the on call preop APP.   Patient has PMH of CAD, VT s/p ICD and ICM. Recent device transmission suggested more VT burden and sign of volume overload. Both amiodarone and lasix were increased. Patient supposed to have a BMET in 1 week, however no order was placed, when they went to the [place] [facility] today, they were unable to obtain the BMET.   Dr. Curt Bears, please comment:  1. if recent increase VT be prohibitive for her to proceed with colonoscopy.    2. Does she need a office appt given recent changes prior to clearance  3. Can she hold plavix if no chest pain   4. I assume you still want a BMET (callback pool to arrange the lab)  Note, her Mobile number listed is actually her sister who is her HPOA. Home number listed is her home phone.   Please forward response to P CV DIV PREOP

## 2020-03-24 NOTE — Telephone Encounter (Signed)
Pt called back on 03/24/20 and advised she got labs done at [place] today.  No action needed.

## 2020-03-24 NOTE — Telephone Encounter (Signed)
See phone note from 03/22/20. Patient wants to know if she still needs to get the BMP done at the [facility] office today. Please advise.

## 2020-03-24 NOTE — Telephone Encounter (Signed)
Pt states that she came by the office this morning for lab work even though she had lab work at her GI doctor on Wednesday.

## 2020-03-27 ENCOUNTER — Other Ambulatory Visit: Payer: Self-pay | Admitting: Cardiology

## 2020-03-27 ENCOUNTER — Other Ambulatory Visit: Payer: Self-pay | Admitting: Physician Assistant

## 2020-03-27 DIAGNOSIS — I5022 Chronic systolic (congestive) heart failure: Secondary | ICD-10-CM | POA: Diagnosis not present

## 2020-03-27 DIAGNOSIS — R197 Diarrhea, unspecified: Secondary | ICD-10-CM

## 2020-03-27 DIAGNOSIS — Z79899 Other long term (current) drug therapy: Secondary | ICD-10-CM | POA: Diagnosis not present

## 2020-03-27 LAB — BASIC METABOLIC PANEL
BUN/Creatinine Ratio: 12 (ref 12–28)
BUN: 19 mg/dL (ref 8–27)
CO2: 27 mmol/L (ref 20–29)
Calcium: 9.6 mg/dL (ref 8.7–10.3)
Chloride: 93 mmol/L — ABNORMAL LOW (ref 96–106)
Creatinine, Ser: 1.54 mg/dL — ABNORMAL HIGH (ref 0.57–1.00)
GFR calc Af Amer: 38 mL/min/{1.73_m2} — ABNORMAL LOW (ref >59)
GFR calc non Af Amer: 33 mL/min/{1.73_m2} — ABNORMAL LOW (ref >59)
Glucose: 240 mg/dL — ABNORMAL HIGH (ref 65–99)
Potassium: 3.7 mmol/L (ref 3.5–5.2)
Sodium: 137 mmol/L (ref 134–144)

## 2020-03-27 NOTE — Addendum Note (Signed)
Addended by: Stanton Kidney on: 03/27/2020 10:50 AM   Modules accepted: Orders

## 2020-03-27 NOTE — Telephone Encounter (Signed)
Faxed BMET lab orders to [facility]

## 2020-03-27 NOTE — Telephone Encounter (Signed)
BMET has already been ordered 03-17-20 and is active in our system.  Called and s/w Oswald Hillock (DPR) she states that pt tried to go to [place] [facility] Friday and they told her that they "do not know what they are talking about" informed [name] that the order has been in the system wince 03-17-20 and I do not know what happened. She will have pt go back to the lab today. Informed [name]  To have pt call us when she is AT the lab so we can see what is happening. She will inform pt. Will await call from the lab or the result.

## 2020-03-28 NOTE — Telephone Encounter (Signed)
BMET result in chart: Will Meredith Leeds, MD 03/27/2020  3:45 PM EDT: K low, increase K rich foods.

## 2020-03-28 NOTE — Telephone Encounter (Signed)
   Appears preop assessment was addressed directly with Dr. Curt Bears. Will remove from the preop pool at this time.  Abigail Butts, PA-C 03/28/20; 4:01 PM

## 2020-03-28 NOTE — Telephone Encounter (Signed)
Called and s/w pt informed pt of lab result and she asks to mail a list of [employer]. Also, she states that she is scheduled to have colonoscopy tomorrow 03-29-2020 @2pm . I will call Dr Kathline Magic office and see what the status is. Pt is asking for CB with update. Called and spoke with operator at Dr Kathline Magic office. She states that yesterday that Dr Curt Bears spoke with their PA Yehuda Mao and told her that pt is ok for Colonoscopy and to do the procedure at [facility] hospital and to hold Plavix for 5 days prior.  Called pt said that she is having a CT tomorrow not the colonoscopy tomorrow. she need the CT first to see if they will proceed with colonscopy.

## 2020-03-29 ENCOUNTER — Ambulatory Visit
Admission: RE | Admit: 2020-03-29 | Discharge: 2020-03-29 | Disposition: A | Discharge status: Home / Self Care | Payer: Medicare Other | Source: Ambulatory Visit | Attending: Physician Assistant | Admitting: Physician Assistant

## 2020-03-29 DIAGNOSIS — K5730 Diverticulosis of large intestine without perforation or abscess without bleeding: Secondary | ICD-10-CM | POA: Diagnosis not present

## 2020-03-29 DIAGNOSIS — R197 Diarrhea, unspecified: Secondary | ICD-10-CM

## 2020-03-29 DIAGNOSIS — I700 Atherosclerosis of aorta: Secondary | ICD-10-CM | POA: Diagnosis not present

## 2020-03-29 MED ORDER — IOPAMIDOL (ISOVUE-300) INJECTION 61%
60.0000 mL | Freq: Once | INTRAVENOUS | Status: AC | PRN
  Administered 2020-03-29: 60 mL via INTRAVENOUS

## 2020-03-31 ENCOUNTER — Telehealth: Payer: Self-pay | Admitting: Cardiology

## 2020-03-31 NOTE — Telephone Encounter (Signed)
° ° °  Pt following up clearance, advised clearance was done and recommendations was sent to doctor who will do procedure. Pt understood

## 2020-04-03 ENCOUNTER — Inpatient Hospital Stay (HOSPITAL_COMMUNITY): Admission: RE | Admit: 2020-04-03 | Payer: Medicare Other | Source: Ambulatory Visit

## 2020-04-04 ENCOUNTER — Other Ambulatory Visit (HOSPITAL_COMMUNITY)
Admission: RE | Admit: 2020-04-04 | Discharge: 2020-04-04 | Disposition: A | Discharge status: Home / Self Care | Payer: Medicare Other | Source: Ambulatory Visit | Attending: Gastroenterology | Admitting: Gastroenterology

## 2020-04-04 DIAGNOSIS — Z20822 Contact with and (suspected) exposure to covid-19: Secondary | ICD-10-CM | POA: Insufficient documentation

## 2020-04-04 DIAGNOSIS — Z01812 Encounter for preprocedural laboratory examination: Secondary | ICD-10-CM | POA: Insufficient documentation

## 2020-04-04 LAB — SARS CORONAVIRUS 2 (TAT 6-24 HRS): SARS Coronavirus 2: NEGATIVE

## 2020-04-05 ENCOUNTER — Telehealth: Payer: Self-pay | Admitting: *Deleted

## 2020-04-05 ENCOUNTER — Encounter (HOSPITAL_COMMUNITY): Payer: Self-pay | Admitting: Gastroenterology

## 2020-04-05 NOTE — Progress Notes (Signed)
Patient states at time of preprocedure phone call she has not received preprocedure instructions regarding Plavix.  Patient instructed to call office of Dr Paulita Fujita at end of this phone call for preprocedure instructions regarding Plavix.  Patient voiced understanding.  Patient given phone number for DR Outlaw.

## 2020-04-05 NOTE — Progress Notes (Signed)
After speaking with patient in regards to preprocedure phone call regarding colonoscopy on 04/06/20 and patient stated thast MD had told her 2 years ago that device went off several times during last colonoscopy spoke with Roanna Banning in regards to above.  Sent email to [place] rep  with [employer] in regards to above with the date and time of procedure on 04/06/20 so that he would be aware of procedure and date and time in case he were to receive a phone call.

## 2020-04-05 NOTE — Telephone Encounter (Signed)
   [facility] Pre-operative Risk Assessment    HEARTCARE STAFF: - Please ensure there is not already an duplicate clearance open for this procedure. - Under Visit Info/Reason for Call, type in Other and utilize the format Clearance MM/DD/YY or Clearance TBD. Do not use dashes or single digits. - If request is for dental extraction, please clarify the # of teeth to be extracted.  Request for surgical clearance: SEE CLEARANCE NOTES FROM 03/23/20.    1. What type of surgery is being performed? ENDOSCOPY and COLONOSCOPY   2. When is this surgery scheduled? 04/07/20   3. What type of clearance is required (medical clearance vs. Pharmacy clearance to hold med vs. Both)? MEDICAL  4. Are there any medications that need to be held prior to surgery and how long? PLAVIX x 5 DAYS PRIOR   5. Practice name and name of physician performing surgery? [facility]; DR. Gwyndolyn Saxon OUTLAW   6. What is the office phone number? [phone]   7.   What is the office fax number? [phone]  8.   Anesthesia type (None, local, MAC, general) ? NOT LISTED (PROPOFOL?)   Jenna West 04/05/2020, 9:44 AM  _________________________________________________________________   (provider comments below)

## 2020-04-05 NOTE — Telephone Encounter (Signed)
   Primary Cardiologist: Shirlee More, MD  Chart reviewed as part of pre-operative protocol coverage. Given past medical history and time since last visit, based on ACC/AHA guidelines, Jenna West would be at acceptable risk for the planned procedure without further cardiovascular testing.   She will hold her Plavix for 5 days prior to procedure.  Please resume as soon as hemostasis is achieved.  I will route this recommendation to the requesting party via Epic fax function and remove from pre-op pool.  Please call with questions.  Jossie Ng. Sawsan Riggio NP-C    04/05/2020, 9:57 AM [address] Office [phone] Fax [phone]

## 2020-04-06 ENCOUNTER — Ambulatory Visit (HOSPITAL_COMMUNITY): Admission: RE | Admit: 2020-04-06 | Payer: Medicare Other | Source: Home / Self Care | Admitting: Gastroenterology

## 2020-04-06 HISTORY — DX: Presence of automatic (implantable) cardiac defibrillator: Z95.810

## 2020-04-06 SURGERY — COLONOSCOPY WITH PROPOFOL
Anesthesia: Monitor Anesthesia Care

## 2020-04-10 ENCOUNTER — Other Ambulatory Visit: Payer: Medicare Other

## 2020-04-24 ENCOUNTER — Other Ambulatory Visit: Payer: Self-pay | Admitting: Cardiology

## 2020-04-24 MED ORDER — NITROGLYCERIN 0.4 MG/SPRAY TL SOLN: 1.0000 | 12 refills | Status: AC | PRN

## 2020-04-24 NOTE — Telephone Encounter (Signed)
    *  STAT* If patient is at the pharmacy, call can be transferred to refill team.   1. Which medications need to be refilled? (please list name of each medication and dose if known) nitroGLYCERIN (NITROLINGUAL) 0.4 MG/SPRAY spray  2. Which pharmacy/location (including street and city if local pharmacy) is medication to be sent to? [place], [address] [employer], [address]  3. Do they need a 30 day or 90 day supply? 1 spray bottle

## 2020-04-24 NOTE — Telephone Encounter (Signed)
Refill sent in per request.  

## 2020-05-24 ENCOUNTER — Other Ambulatory Visit: Payer: Self-pay | Admitting: Cardiology

## 2020-05-27 ENCOUNTER — Other Ambulatory Visit: Payer: Self-pay | Admitting: Cardiology

## 2020-05-29 ENCOUNTER — Encounter: Payer: Medicare Other | Admitting: Cardiology

## 2020-06-12 ENCOUNTER — Ambulatory Visit (INDEPENDENT_AMBULATORY_CARE_PROVIDER_SITE_OTHER): Payer: Medicare Other | Admitting: Emergency Medicine

## 2020-06-12 ENCOUNTER — Telehealth: Payer: Self-pay

## 2020-06-12 DIAGNOSIS — I472 Ventricular tachycardia, unspecified: Secondary | ICD-10-CM

## 2020-06-12 LAB — CUP PACEART REMOTE DEVICE CHECK
Battery Remaining Longevity: 26 mo
Battery Voltage: 2.92 V
Brady Statistic AP VP Percent: 0.02 %
Brady Statistic AP VS Percent: 57.41 %
Brady Statistic AS VP Percent: 0.02 %
Brady Statistic AS VS Percent: 42.55 %
Brady Statistic RA Percent Paced: 57.21 %
Brady Statistic RV Percent Paced: 0.04 %
Date Time Interrogation Session: 20210927022826
HighPow Impedance: 30 Ohm
HighPow Impedance: 38 Ohm
Implantable Lead Implant Date: 20091013
Implantable Lead Implant Date: 20091013
Implantable Lead Location: 753859
Implantable Lead Location: 753860
Implantable Lead Model: 4076
Implantable Lead Model: 6947
Implantable Pulse Generator Implant Date: 20150521
Lead Channel Impedance Value: 304 Ohm
Lead Channel Impedance Value: 342 Ohm
Lead Channel Impedance Value: 380 Ohm
Lead Channel Pacing Threshold Amplitude: 0.625 V
Lead Channel Pacing Threshold Amplitude: 1.125 V
Lead Channel Pacing Threshold Pulse Width: 0.4 ms
Lead Channel Pacing Threshold Pulse Width: 0.4 ms
Lead Channel Sensing Intrinsic Amplitude: 17 mV
Lead Channel Sensing Intrinsic Amplitude: 17 mV
Lead Channel Sensing Intrinsic Amplitude: 2.875 mV
Lead Channel Sensing Intrinsic Amplitude: 2.875 mV
Lead Channel Setting Pacing Amplitude: 1.5 V
Lead Channel Setting Pacing Amplitude: 2.25 V
Lead Channel Setting Pacing Pulse Width: 0.4 ms
Lead Channel Setting Sensing Sensitivity: 0.3 mV

## 2020-06-12 NOTE — Telephone Encounter (Signed)
Carelink alert- 17 treated VT episodes, rates 150-180bpm treated successfully with ATP 1 burst except most recent on 06/05/20 requiring 3 sequences. 42 NSVT. No shocks. Routing for further review. On Amiodarone.   Noted that Jenna had an appointment on 9/13 that Jenna missed.  Will need to reschedule for appt.    Reviewed current episode with Dr. Quentin Ore in office, he advised missed appt needs to be rescheduled for MD/ APP as soon as avail.    Spoke with Jenna West due to apparent memory issues when speaking with Jenna.    It does seem that Jenna has had a decrease in frequency of episodes since the majority of the episodes occurred in the month of June.  When speaking with Jenna she denies any symptoms expect for maybe some tiredness lately, Jenna also seemed overly concerned about her phone not working for the past couple of weeks.  Jenna did not seem to understand that her phone not working had no bearing on her heart rate.  I spoke with Jenna spouse and he stated that the phone has been working correctly and indicated that this has been issue with Jenna memory lately.    Jenna West confirmed Jenna compliance with medications and denies that Jenna has had any cardiac complaints.    Optivol is elevated.  Jenna spouse said that she is taking furosemide.  Jenna denies any increased SOB, although Jenna has had a cough for sometime.  Jenna spouse requested that I call Jenna sister Jenna West who is on [place].    Spoke with Jenna West, advised of current situation.  She ahs some concerns about Jenna compliance with medications.  She also reports Jenna has been having issues with Diarrhea that Jenna is refusing to have colonoscopy as recommended, states Jenna daughter died of colon cancer and Jenna is fearful of having same issue.  Jenna West wanted Dr. Curt Bears to know this.

## 2020-06-13 ENCOUNTER — Telehealth: Payer: Self-pay | Admitting: Cardiology

## 2020-06-13 NOTE — Telephone Encounter (Signed)
Medication list printed and was put up front for pick-up at this time.

## 2020-06-13 NOTE — Telephone Encounter (Signed)
Patient states her daughter Derry Lory will be coming by today around 9:30 am to pick up a list of the patient's medications.

## 2020-06-13 NOTE — Progress Notes (Signed)
Remote ICD transmission.   

## 2020-06-15 DIAGNOSIS — I110 Hypertensive heart disease with heart failure: Secondary | ICD-10-CM | POA: Diagnosis not present

## 2020-06-15 DIAGNOSIS — E1165 Type 2 diabetes mellitus with hyperglycemia: Secondary | ICD-10-CM | POA: Diagnosis not present

## 2020-06-15 DIAGNOSIS — I5032 Chronic diastolic (congestive) heart failure: Secondary | ICD-10-CM | POA: Diagnosis not present

## 2020-06-18 ENCOUNTER — Inpatient Hospital Stay (HOSPITAL_COMMUNITY)
Admission: EM | Admit: 2020-06-18 | Discharge: 2020-06-20 | DRG: 100 | Disposition: A | Discharge status: Home Health | Payer: Medicare Other | Attending: Family Medicine | Admitting: Family Medicine

## 2020-06-18 ENCOUNTER — Emergency Department (HOSPITAL_COMMUNITY): Payer: Medicare Other

## 2020-06-18 ENCOUNTER — Other Ambulatory Visit: Payer: Self-pay

## 2020-06-18 DIAGNOSIS — I5023 Acute on chronic systolic (congestive) heart failure: Secondary | ICD-10-CM | POA: Diagnosis not present

## 2020-06-18 DIAGNOSIS — Z95810 Presence of automatic (implantable) cardiac defibrillator: Secondary | ICD-10-CM | POA: Diagnosis not present

## 2020-06-18 DIAGNOSIS — N1832 Chronic kidney disease, stage 3b: Secondary | ICD-10-CM | POA: Diagnosis present

## 2020-06-18 DIAGNOSIS — E1122 Type 2 diabetes mellitus with diabetic chronic kidney disease: Secondary | ICD-10-CM | POA: Diagnosis present

## 2020-06-18 DIAGNOSIS — Z7902 Long term (current) use of antithrombotics/antiplatelets: Secondary | ICD-10-CM

## 2020-06-18 DIAGNOSIS — R413 Other amnesia: Secondary | ICD-10-CM | POA: Diagnosis present

## 2020-06-18 DIAGNOSIS — I255 Ischemic cardiomyopathy: Secondary | ICD-10-CM | POA: Diagnosis present

## 2020-06-18 DIAGNOSIS — N1831 Chronic kidney disease, stage 3a: Secondary | ICD-10-CM | POA: Diagnosis not present

## 2020-06-18 DIAGNOSIS — Z794 Long term (current) use of insulin: Secondary | ICD-10-CM

## 2020-06-18 DIAGNOSIS — Z20822 Contact with and (suspected) exposure to covid-19: Secondary | ICD-10-CM | POA: Diagnosis present

## 2020-06-18 DIAGNOSIS — R197 Diarrhea, unspecified: Secondary | ICD-10-CM

## 2020-06-18 DIAGNOSIS — F32A Depression, unspecified: Secondary | ICD-10-CM | POA: Diagnosis present

## 2020-06-18 DIAGNOSIS — R569 Unspecified convulsions: Secondary | ICD-10-CM | POA: Diagnosis not present

## 2020-06-18 DIAGNOSIS — Z90710 Acquired absence of both cervix and uterus: Secondary | ICD-10-CM

## 2020-06-18 DIAGNOSIS — Z91013 Allergy to seafood: Secondary | ICD-10-CM

## 2020-06-18 DIAGNOSIS — W19XXXA Unspecified fall, initial encounter: Secondary | ICD-10-CM | POA: Diagnosis present

## 2020-06-18 DIAGNOSIS — Z8249 Family history of ischemic heart disease and other diseases of the circulatory system: Secondary | ICD-10-CM

## 2020-06-18 DIAGNOSIS — E8809 Other disorders of plasma-protein metabolism, not elsewhere classified: Secondary | ICD-10-CM | POA: Diagnosis not present

## 2020-06-18 DIAGNOSIS — I252 Old myocardial infarction: Secondary | ICD-10-CM

## 2020-06-18 DIAGNOSIS — K529 Noninfective gastroenteritis and colitis, unspecified: Secondary | ICD-10-CM | POA: Diagnosis not present

## 2020-06-18 DIAGNOSIS — S060X9A Concussion with loss of consciousness of unspecified duration, initial encounter: Secondary | ICD-10-CM

## 2020-06-18 DIAGNOSIS — I472 Ventricular tachycardia: Secondary | ICD-10-CM | POA: Diagnosis not present

## 2020-06-18 DIAGNOSIS — E876 Hypokalemia: Secondary | ICD-10-CM | POA: Diagnosis not present

## 2020-06-18 DIAGNOSIS — R410 Disorientation, unspecified: Secondary | ICD-10-CM | POA: Diagnosis not present

## 2020-06-18 DIAGNOSIS — N183 Chronic kidney disease, stage 3 unspecified: Secondary | ICD-10-CM

## 2020-06-18 DIAGNOSIS — N179 Acute kidney failure, unspecified: Secondary | ICD-10-CM | POA: Diagnosis present

## 2020-06-18 DIAGNOSIS — Z9181 History of falling: Secondary | ICD-10-CM | POA: Diagnosis not present

## 2020-06-18 DIAGNOSIS — G40409 Other generalized epilepsy and epileptic syndromes, not intractable, without status epilepticus: Secondary | ICD-10-CM | POA: Diagnosis present

## 2020-06-18 DIAGNOSIS — E039 Hypothyroidism, unspecified: Secondary | ICD-10-CM | POA: Diagnosis not present

## 2020-06-18 DIAGNOSIS — G4730 Sleep apnea, unspecified: Secondary | ICD-10-CM | POA: Diagnosis not present

## 2020-06-18 DIAGNOSIS — Z882 Allergy status to sulfonamides status: Secondary | ICD-10-CM

## 2020-06-18 DIAGNOSIS — R0902 Hypoxemia: Secondary | ICD-10-CM | POA: Diagnosis not present

## 2020-06-18 DIAGNOSIS — R61 Generalized hyperhidrosis: Secondary | ICD-10-CM | POA: Diagnosis not present

## 2020-06-18 DIAGNOSIS — E7800 Pure hypercholesterolemia, unspecified: Secondary | ICD-10-CM | POA: Diagnosis not present

## 2020-06-18 DIAGNOSIS — J811 Chronic pulmonary edema: Secondary | ICD-10-CM | POA: Diagnosis not present

## 2020-06-18 DIAGNOSIS — Z955 Presence of coronary angioplasty implant and graft: Secondary | ICD-10-CM

## 2020-06-18 DIAGNOSIS — Z79899 Other long term (current) drug therapy: Secondary | ICD-10-CM

## 2020-06-18 DIAGNOSIS — I517 Cardiomegaly: Secondary | ICD-10-CM | POA: Diagnosis not present

## 2020-06-18 DIAGNOSIS — R451 Restlessness and agitation: Secondary | ICD-10-CM | POA: Diagnosis not present

## 2020-06-18 DIAGNOSIS — Z7982 Long term (current) use of aspirin: Secondary | ICD-10-CM

## 2020-06-18 DIAGNOSIS — Z9114 Patient's other noncompliance with medication regimen: Secondary | ICD-10-CM

## 2020-06-18 DIAGNOSIS — I429 Cardiomyopathy, unspecified: Secondary | ICD-10-CM

## 2020-06-18 DIAGNOSIS — I130 Hypertensive heart and chronic kidney disease with heart failure and stage 1 through stage 4 chronic kidney disease, or unspecified chronic kidney disease: Secondary | ICD-10-CM | POA: Diagnosis present

## 2020-06-18 DIAGNOSIS — I25118 Atherosclerotic heart disease of native coronary artery with other forms of angina pectoris: Secondary | ICD-10-CM | POA: Diagnosis not present

## 2020-06-18 DIAGNOSIS — R55 Syncope and collapse: Secondary | ICD-10-CM

## 2020-06-18 DIAGNOSIS — I10 Essential (primary) hypertension: Secondary | ICD-10-CM | POA: Diagnosis not present

## 2020-06-18 DIAGNOSIS — Z833 Family history of diabetes mellitus: Secondary | ICD-10-CM

## 2020-06-18 LAB — ETHANOL: Alcohol, Ethyl (B): <10 mg/dL (ref <10)

## 2020-06-18 LAB — RESP PANEL BY RT PCR (RSV, FLU A&B, COVID)
Influenza A by PCR: NEGATIVE
Influenza B by PCR: NEGATIVE
Respiratory Syncytial Virus by PCR: NEGATIVE
SARS Coronavirus 2 by RT PCR: NEGATIVE

## 2020-06-18 LAB — CBC WITH DIFFERENTIAL/PLATELET
Abs Immature Granulocytes: 0.01 10*3/uL (ref 0.00–0.07)
Basophils Absolute: 0 10*3/uL (ref 0.0–0.1)
Basophils Relative: 1 %
Eosinophils Absolute: 0.1 10*3/uL (ref 0.0–0.5)
Eosinophils Relative: 2 %
HCT: 46.5 % — ABNORMAL HIGH (ref 36.0–46.0)
Hemoglobin: 14.4 g/dL (ref 12.0–15.0)
Immature Granulocytes: 0 %
Lymphocytes Relative: 30 %
Lymphs Abs: 1.5 10*3/uL (ref 0.7–4.0)
MCH: 28.2 pg (ref 26.0–34.0)
MCHC: 31 g/dL (ref 30.0–36.0)
MCV: 91 fL (ref 80.0–100.0)
Monocytes Absolute: 0.4 10*3/uL (ref 0.1–1.0)
Monocytes Relative: 8 %
Neutro Abs: 3 10*3/uL (ref 1.7–7.7)
Neutrophils Relative %: 59 %
Platelets: 171 10*3/uL (ref 150–400)
RBC: 5.11 MIL/uL (ref 3.87–5.11)
RDW: 15.7 % — ABNORMAL HIGH (ref 11.5–15.5)
WBC: 5.1 10*3/uL (ref 4.0–10.5)
nRBC: 0 % (ref 0.0–0.2)

## 2020-06-18 LAB — URINALYSIS, COMPLETE (UACMP) WITH MICROSCOPIC
Bacteria, UA: NONE SEEN
Bilirubin Urine: NEGATIVE
Glucose, UA: NEGATIVE mg/dL
Ketones, ur: NEGATIVE mg/dL
Nitrite: NEGATIVE
Protein, ur: 30 mg/dL — AB
Specific Gravity, Urine: 1.011 (ref 1.005–1.030)
pH: 6 (ref 5.0–8.0)

## 2020-06-18 LAB — COMPREHENSIVE METABOLIC PANEL
ALT: 21 U/L (ref 0–44)
AST: 27 U/L (ref 15–41)
Albumin: 3.3 g/dL — ABNORMAL LOW (ref 3.5–5.0)
Alkaline Phosphatase: 78 U/L (ref 38–126)
Anion gap: 17 — ABNORMAL HIGH (ref 5–15)
BUN: 18 mg/dL (ref 8–23)
CO2: 23 mmol/L (ref 22–32)
Calcium: 8.7 mg/dL — ABNORMAL LOW (ref 8.9–10.3)
Chloride: 98 mmol/L (ref 98–111)
Creatinine, Ser: 1.75 mg/dL — ABNORMAL HIGH (ref 0.44–1.00)
GFR calc Af Amer: 33 mL/min — ABNORMAL LOW (ref >60)
GFR calc non Af Amer: 28 mL/min — ABNORMAL LOW (ref >60)
Glucose, Bld: 76 mg/dL (ref 70–99)
Potassium: 3.2 mmol/L — ABNORMAL LOW (ref 3.5–5.1)
Sodium: 138 mmol/L (ref 135–145)
Total Bilirubin: 1.6 mg/dL — ABNORMAL HIGH (ref 0.3–1.2)
Total Protein: 6.6 g/dL (ref 6.5–8.1)

## 2020-06-18 LAB — I-STAT CHEM 8, ED
BUN: 21 mg/dL (ref 8–23)
Calcium, Ion: 0.95 mmol/L — ABNORMAL LOW (ref 1.15–1.40)
Chloride: 98 mmol/L (ref 98–111)
Creatinine, Ser: 1.6 mg/dL — ABNORMAL HIGH (ref 0.44–1.00)
Glucose, Bld: 74 mg/dL (ref 70–99)
HCT: 47 % — ABNORMAL HIGH (ref 36.0–46.0)
Hemoglobin: 16 g/dL — ABNORMAL HIGH (ref 12.0–15.0)
Potassium: 3.1 mmol/L — ABNORMAL LOW (ref 3.5–5.1)
Sodium: 136 mmol/L (ref 135–145)
TCO2: 26 mmol/L (ref 22–32)

## 2020-06-18 LAB — PROTIME-INR
INR: 1.1 (ref 0.8–1.2)
Prothrombin Time: 13.5 seconds (ref 11.4–15.2)

## 2020-06-18 LAB — CBG MONITORING, ED: Glucose-Capillary: 104 mg/dL — ABNORMAL HIGH (ref 70–99)

## 2020-06-18 LAB — RAPID URINE DRUG SCREEN, HOSP PERFORMED
Amphetamines: NOT DETECTED
Barbiturates: NOT DETECTED
Benzodiazepines: NOT DETECTED
Cocaine: NOT DETECTED
Opiates: NOT DETECTED
Tetrahydrocannabinol: NOT DETECTED

## 2020-06-18 LAB — HEMOGLOBIN A1C
Hgb A1c MFr Bld: 6.6 % — ABNORMAL HIGH (ref 4.8–5.6)
Mean Plasma Glucose: 142.72 mg/dL

## 2020-06-18 LAB — BRAIN NATRIURETIC PEPTIDE: B Natriuretic Peptide: 714.1 pg/mL — ABNORMAL HIGH (ref 0.0–100.0)

## 2020-06-18 LAB — TSH: TSH: 3.705 u[IU]/mL (ref 0.350–4.500)

## 2020-06-18 LAB — TROPONIN I (HIGH SENSITIVITY): Troponin I (High Sensitivity): 25 ng/L — ABNORMAL HIGH (ref <18)

## 2020-06-18 MED ORDER — SACUBITRIL-VALSARTAN 24-26 MG PO TABS
1.0000 | ORAL_TABLET | Freq: Two times a day (BID) | ORAL | Status: DC
  Administered 2020-06-19: 1 via ORAL
  Filled 2020-06-18 (×3): qty 1

## 2020-06-18 MED ORDER — NITROGLYCERIN 0.4 MG/SPRAY TL SOLN
1.0000 | Status: DC | PRN
  Filled 2020-06-18: qty 12

## 2020-06-18 MED ORDER — LEVETIRACETAM IN NACL 500 MG/100ML IV SOLN
500.0000 mg | Freq: Two times a day (BID) | INTRAVENOUS | Status: DC
  Administered 2020-06-18 – 2020-06-19 (×3): 500 mg via INTRAVENOUS
  Filled 2020-06-18 (×4): qty 100

## 2020-06-18 MED ORDER — FUROSEMIDE 20 MG PO TABS: 20.0000 mg | ORAL_TABLET | Freq: Every day | ORAL | Status: DC

## 2020-06-18 MED ORDER — SODIUM CHLORIDE 0.9 % IV SOLN
75.0000 mL/h | INTRAVENOUS | Status: AC
  Administered 2020-06-18: 75 mL/h via INTRAVENOUS

## 2020-06-18 MED ORDER — ATORVASTATIN CALCIUM 80 MG PO TABS
80.0000 mg | ORAL_TABLET | Freq: Every day | ORAL | Status: DC
  Administered 2020-06-18 – 2020-06-19 (×2): 80 mg via ORAL
  Filled 2020-06-18: qty 1
  Filled 2020-06-18: qty 2

## 2020-06-18 MED ORDER — SERTRALINE HCL 50 MG PO TABS
50.0000 mg | ORAL_TABLET | Freq: Every day | ORAL | Status: DC
  Administered 2020-06-19 – 2020-06-20 (×2): 50 mg via ORAL
  Filled 2020-06-18 (×3): qty 1

## 2020-06-18 MED ORDER — SODIUM CHLORIDE 0.9 % IV BOLUS
1000.0000 mL | Freq: Once | INTRAVENOUS | Status: AC
  Administered 2020-06-18: 1000 mL via INTRAVENOUS

## 2020-06-18 MED ORDER — HEPARIN SODIUM (PORCINE) 5000 UNIT/ML IJ SOLN
5000.0000 [IU] | Freq: Three times a day (TID) | INTRAMUSCULAR | Status: DC
  Administered 2020-06-18 – 2020-06-20 (×6): 5000 [IU] via SUBCUTANEOUS
  Filled 2020-06-18 (×5): qty 1

## 2020-06-18 MED ORDER — SODIUM CHLORIDE 0.9 % IV SOLN: INTRAVENOUS | Status: DC

## 2020-06-18 MED ORDER — AMIODARONE HCL 200 MG PO TABS
100.0000 mg | ORAL_TABLET | Freq: Every day | ORAL | Status: DC
  Administered 2020-06-19 – 2020-06-20 (×2): 100 mg via ORAL
  Filled 2020-06-18 (×2): qty 1

## 2020-06-18 MED ORDER — LEVOTHYROXINE SODIUM 100 MCG PO TABS
100.0000 ug | ORAL_TABLET | Freq: Every day | ORAL | Status: DC
  Administered 2020-06-19 – 2020-06-20 (×2): 100 ug via ORAL
  Filled 2020-06-18 (×2): qty 1

## 2020-06-18 MED ORDER — ACETAMINOPHEN 650 MG RE SUPP: 650.0000 mg | RECTAL | Status: DC | PRN

## 2020-06-18 MED ORDER — INSULIN ASPART 100 UNIT/ML ~~LOC~~ SOLN
0.0000 [IU] | Freq: Three times a day (TID) | SUBCUTANEOUS | Status: DC
  Administered 2020-06-19: 2 [IU] via SUBCUTANEOUS
  Administered 2020-06-20 (×2): 1 [IU] via SUBCUTANEOUS

## 2020-06-18 MED ORDER — CARVEDILOL 6.25 MG PO TABS
6.2500 mg | ORAL_TABLET | Freq: Two times a day (BID) | ORAL | Status: DC
  Administered 2020-06-19 – 2020-06-20 (×3): 6.25 mg via ORAL
  Filled 2020-06-18 (×2): qty 1
  Filled 2020-06-18: qty 2
  Filled 2020-06-18: qty 1

## 2020-06-18 MED ORDER — SENNA 8.6 MG PO TABS
1.0000 | ORAL_TABLET | Freq: Two times a day (BID) | ORAL | Status: DC
  Administered 2020-06-18 – 2020-06-20 (×4): 8.6 mg via ORAL
  Filled 2020-06-18 (×4): qty 1

## 2020-06-18 MED ORDER — SPIRONOLACTONE 12.5 MG HALF TABLET
12.5000 mg | ORAL_TABLET | ORAL | Status: DC
  Administered 2020-06-19: 12.5 mg via ORAL
  Filled 2020-06-18: qty 1

## 2020-06-18 MED ORDER — CLOPIDOGREL BISULFATE 75 MG PO TABS
75.0000 mg | ORAL_TABLET | Freq: Every day | ORAL | Status: DC
  Administered 2020-06-19 – 2020-06-20 (×2): 75 mg via ORAL
  Filled 2020-06-18 (×3): qty 1

## 2020-06-18 MED ORDER — ASPIRIN EC 81 MG PO TBEC
81.0000 mg | DELAYED_RELEASE_TABLET | Freq: Every day | ORAL | Status: DC
  Administered 2020-06-19 – 2020-06-20 (×2): 81 mg via ORAL
  Filled 2020-06-18 (×2): qty 1

## 2020-06-18 MED ORDER — ACETAMINOPHEN 325 MG PO TABS
650.0000 mg | ORAL_TABLET | ORAL | Status: DC | PRN
  Administered 2020-06-18 – 2020-06-19 (×2): 650 mg via ORAL
  Filled 2020-06-18 (×2): qty 2

## 2020-06-18 MED ORDER — ZOLPIDEM TARTRATE 5 MG PO TABS
10.0000 mg | ORAL_TABLET | Freq: Every day | ORAL | Status: DC
  Administered 2020-06-18 – 2020-06-19 (×2): 10 mg via ORAL
  Filled 2020-06-18 (×2): qty 2

## 2020-06-18 MED ORDER — INSULIN GLARGINE 100 UNIT/ML ~~LOC~~ SOLN
10.0000 [IU] | Freq: Every day | SUBCUTANEOUS | Status: DC
  Administered 2020-06-20: 10 [IU] via SUBCUTANEOUS
  Filled 2020-06-18 (×3): qty 0.1

## 2020-06-18 MED ORDER — RANOLAZINE ER 500 MG PO TB12
500.0000 mg | ORAL_TABLET | Freq: Every day | ORAL | Status: DC
  Administered 2020-06-18 – 2020-06-20 (×3): 500 mg via ORAL
  Filled 2020-06-18 (×3): qty 1

## 2020-06-18 NOTE — ED Notes (Addendum)
Attempted to in and out cath the pt, only one drop of urine returned. RN notified

## 2020-06-18 NOTE — Consult Note (Signed)
Neurology consult H&P  CC: Seizure  History is obtained from: patient and her sister  HPI: Jenna West is a 74 y.o. female carotid stenting, CAD s/p PCI LAD/RCA, s/p MI, h/o V-tach with ICD, syncope, cardiomyopathy, CKD 3 and chronic diarrhea often explosive, multiple falls with loss of consciousness, questionable medication adherence presented to ED due to seizure.  Seizure described as eyes rolling to back of head followed by generalized tonic clonic convulsions which lasted about 3 minutes without loss of bladder continence.   Never had seizure in the past.  Denies chest pain, chest pressure, SOB, cough.  ROS: A complete ROS was performed and is negative except as noted in the HPI.   Past Medical History:  Diagnosis Date  . Acquired hypothyroidism 05/01/2015  . AICD (automatic cardioverter/defibrillator) present   . Angina pectoris ([place]) 07/13/2015  . Aspirin long-term use 03/07/2016  . Benign essential hypertension 05/01/2015  . Bilateral carotid artery stenosis 03/07/2016  . 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 Carotid artery stenosis 06/21/2015  . Chronic systolic heart failure ([place]) 05/28/2017  . Coronary arteriosclerosis in native artery 05/01/2015   Overview:  Overview:  PCI and stent to LAD and RCA 2004 for MI DES to LAD for subtotal occlusion with no reflow 8/09  . Coronary artery disease involving native coronary artery of native heart with angina pectoris ([place]) 05/01/2015   Overview:  Overview:  PCI and stent to LAD and RCA 2004 for MI DES to LAD for subtotal occlusion with no reflow 8/09  . Coronary artery disease of native artery of native heart with stable angina pectoris ([place]) 05/01/2015   Overview:  PCI and stent to LAD and RCA 2004 for MI DES to LAD for subtotal occlusion with no reflow 2009  . Heartburn 05/29/2017  . 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 Hypertensive heart and kidney disease with HF and CKD ([place]) 05/28/2017  . ICD (implantable cardioverter-defibrillator) in place 05/01/2015   Overview:  [employer]  . Ischemic cardiomyopathy 06/04/2017  . Left heart failure ([place]) 05/01/2015  . MI (myocardial infarction) ([place]) 05/01/2015  . On amiodarone therapy 08/15/2016  . Pure hypercholesterolemia 05/01/2015  . Stage 3 chronic kidney disease 12/18/2016  . Syncope and collapse 05/01/2015  . Type 2 diabetes mellitus with stage 3 chronic kidney disease, with long-term current use of insulin ([place]) 07/17/2016  . VT (ventricular tachycardia) ([place]) 05/01/2015    Family History  Problem Relation Age of Onset  . Heart attack Father   . Heart disease Father   . Brain cancer Mother   . Diabetes Sister   . Breast cancer Sister   . Rectal cancer Daughter    Social History:  reports that she has never smoked. She has never used smokeless tobacco. She reports that she does not drink alcohol and does not use drugs.  Prior to Admission medications   Medication Sig Start Date End Date Taking? Authorizing Provider  amiodarone (PACERONE) 200 MG tablet TAKE ONE-HALF TABLET BY  MOUTH ON MONDAY, WEDNESDAY, AND FRIDAY AS DIRECTED Patient taking differently: Take 100 mg by mouth daily.  05/26/20  Yes Camnitz, Ocie Doyne, MD  aspirin EC 81 MG tablet Take 81 mg by mouth daily.   Yes [provider]  atorvastatin (LIPITOR) 80 MG tablet TAKE 1 TABLET BY MOUTH  DAILY Patient taking differently: Take 80 mg  by mouth at bedtime.  05/29/20  Yes Richardo Priest, MD  carvedilol (COREG) 6.25 MG tablet TAKE 1 TABLET BY MOUTH  TWICE DAILY Patient taking differently: Take 6.25 mg by mouth 2 (two) times daily with a meal.  05/24/20  Yes Camnitz, Will Hassell Done, MD  clopidogrel (PLAVIX) 75 MG tablet TAKE 1 TABLET BY MOUTH  DAILY Patient taking differently: Take 75 mg by mouth daily.  05/29/20  Yes Richardo Priest, MD  furosemide (LASIX) 20 MG  tablet TAKE 1 TABLET BY MOUTH  DAILY Patient taking differently: Take 20 mg by mouth daily.  03/28/20  Yes Richardo Priest, MD  HUMALOG 100 UNIT/ML injection Inject 12 Units into the skin daily.  05/25/17  Yes [provider]  insulin glargine (LANTUS) 100 UNIT/ML injection Inject 10 Units into the skin daily.   Yes [provider]  levothyroxine (SYNTHROID, LEVOTHROID) 100 MCG tablet Take 100 mcg by mouth daily before breakfast.  07/02/17  Yes [provider]  nitroGLYCERIN (NITROLINGUAL) 0.4 MG/SPRAY spray Place 1 spray under the tongue every 5 (five) minutes x 3 doses as needed for chest pain. 04/24/20  Yes Richardo Priest, MD  ranolazine (RANEXA) 500 MG 12 hr tablet Take 500 mg by mouth daily.  11/07/15  Yes [provider]  sacubitril-valsartan (ENTRESTO) 24-26 MG Take 1 tablet by mouth 2 (two) times daily. 11/22/19  Yes Camnitz, Will Hassell Done, MD  sertraline (ZOLOFT) 50 MG tablet Take 50 mg by mouth daily. 10/24/18  Yes [provider]  spironolactone (ALDACTONE) 25 MG tablet TAKE ONE-HALF TABLET BY  MOUTH DAILY Patient taking differently: Take 12.5 mg by mouth every other day.  05/29/20  Yes Richardo Priest, MD  Vitamin D, Ergocalciferol, (DRISDOL) 1.25 MG (50000 UT) CAPS capsule Take 50,000 Units by mouth every Wednesday.    Yes [provider]  zolpidem (AMBIEN) 10 MG tablet Take 10 mg by mouth at bedtime. 07/31/19  Yes [provider]  Blood Glucose Monitoring Suppl (ACCU-CHEK AVIVA PLUS) w/Device KIT Apply 1 Dose topically daily. 04/07/19   [provider]     Exam: Current vital signs: BP (!) 117/59   Pulse 71   Temp (!) 97.4 F (36.3 C) (Oral)   Resp 13   SpO2 96%    Physical Exam  Constitutional: Appears well-developed and well-nourished.  Psych: Affect appropriate to situation Eyes: No scleral injection HENT: No OP obstrucion Head: Normocephalic.  Cardiovascular: Normal rate and regular rhythm.  Respiratory:  Effort normal and breath sounds normal to anterior ascultation GI: Soft.  No distension. There is no tenderness.  Skin: WDI  Neuro: Mental Status: Patient is awake, alert, oriented to person, place, month, year, and situation. Patient is able to give a clear and coherent history. No signs of aphasia or neglect Cranial Nerves: II: Visual Fields are full. Pupils are equal, round, and reactive to light.   III,IV, VI: EOMI without ptosis or diploplia.  V: Facial sensation is symmetric to temperature VII: Facial movement is symmetric.  VIII: hearing is intact to voice X: Uvula elevates symmetrically XI: Shoulder shrug is symmetric. XII: tongue is midline without atrophy or fasciculations.  Motor: Tone is normal. Bulk is normal. 5/5 strength was present in all four extremities.  Sensory: Sensation is symmetric to light touch and temperature in the arms and legs. Deep Tendon Reflexes: 2+ and symmetric in the biceps and patellae.  Plantars: Toes are downgoing bilaterally.  Cerebellar: FNF and HKS are intact bilaterally  Results  for Jenna, West (MRN [id number]) as of 06/18/2020 17:53  Ref. Range 06/18/2020 12:40  Calcium Latest Ref Range: 8.9 - 10.3 mg/dL 8.7 (L)   I have reviewed the images obtained: NCT head showed no acute intracranial abnormality.  Assessment: 74 year old woman with multiple comorbid conditions with acute seizure and labs showing hypocalcemia. Though acute hypocalcemia is a known cause of seizure, the chronicity is not clear. In addition, the patient has had multiple falls with loss of consciousness suggesting concussion which may also be a cause of seizure. Unfortunately, the patient has a pacemaker and cannot have MRI.  Plan: Repeat NCT head in 24 hours rEEG Replete electrolytes. Will continue to follow along.  Electronically signed by: Dr. Lynnae Sandhoff Pager: [phone] 06/18/2020, 5:48 PM

## 2020-06-18 NOTE — ED Triage Notes (Signed)
Per EMS patient was out with sister at lunch who is an ED RN. Upon arrival patient was post ictal, diaphoretic, non verbal, and confusedwith no hx of seizures. Patient does have a pacemaker which EMS states was off and on pacing. EMS did use physical restraints because patient was trying to pull out lines. Patient is on Plavix and has history of falls. Upon arrival 18 g in left forearm and 18 g in left AC. EMS states pupils were unequal with the left being larger and both were sluggish to react. EMS also stated she vomited prior to arrival. Upon arrival both pupils were equal and reactive to light. Suction seizure pads, and oxygen set up prior to arrival. EMS BP 126/80 HR 70-80s T 97.8 RR 16-20 O2 98 on 6L

## 2020-06-18 NOTE — ED Notes (Signed)
Lunch Tray Ordered @ 1715. 

## 2020-06-18 NOTE — H&P (Addendum)
History and Physical    Jenna West BZM:[id number] DOB: [birth year] DOA: 06/18/2020  PCP: Ronita Hipps, MD   Patient coming from: home  I have personally briefly reviewed patient's old medical records in [place]  Chief Complaint: witnessed grand mal seizure  HPI: Jenna West is a 74 y.o. female with medical history significant of CAD s/p PCI LAD/RCA, s/p MI, h/o V-tach with ICD, syncope, cardiomyopathy with last EF 20% by echo 08/10/19, CKD 3. Patient has not been doing well. She has not been taking her medications reliably, she has been increasingly weak, has had multiple falls. She has had diarrhea for 9 months per her sister and has lost 50 lbs of weight. She has had progressive memory loss and cognitive decline.   Today she was with her sister and had a seizure with her eyes rolled back, tonic-clonic jerking motions and loss of consciousness. EMS was called and transported patient to [facility].  Patient denies any chest pain, chest pressure, SOB, cough. Does admit to diarrhea, mild Nausea.   ED Course: T 97.4  135/69  HR79, RR 14. Lab reveeal K 3.1 BUN 21, Cr 1.6, glucose 74. CT head negative for acute changes. CXR with cardiomegaly, post-infarct ventricular mural calciication, pulmonary vascular congestion at bases. EKG SR, increased PR interval, ST elevations suggestive of acute posterior and lateral infarct but not appreciably changed from her last 2 EKGs. EDP called for neurology consult - Dr. Theda Sers who recommended admisson, EEK, MRI brain. TRH called to admit patient.   Review of Systems: As per HPI otherwise 10 point review of systems negative. Caveat - mildly post-ictal and with memory loss.    Past Medical History:  Diagnosis Date  . Acquired hypothyroidism 05/01/2015  . AICD (automatic cardioverter/defibrillator) present   . Angina pectoris ([place]) 07/13/2015  . Aspirin long-term use 03/07/2016  . Benign essential hypertension 05/01/2015  . Bilateral carotid  artery stenosis 03/07/2016  . 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 Carotid artery stenosis 06/21/2015  . Chronic systolic heart failure ([place]) 05/28/2017  . Coronary arteriosclerosis in native artery 05/01/2015   Overview:  Overview:  PCI and stent to LAD and RCA 2004 for MI DES to LAD for subtotal occlusion with no reflow 8/09  . Coronary artery disease involving native coronary artery of native heart with angina pectoris ([place]) 05/01/2015   Overview:  Overview:  PCI and stent to LAD and RCA 2004 for MI DES to LAD for subtotal occlusion with no reflow 8/09  . Coronary artery disease of native artery of native heart with stable angina pectoris ([place]) 05/01/2015   Overview:  PCI and stent to LAD and RCA 2004 for MI DES to LAD for subtotal occlusion with no reflow 2009  . Heartburn 05/29/2017  . 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 Hypertensive heart and kidney disease with HF and CKD ([place]) 05/28/2017  . ICD (implantable cardioverter-defibrillator) in place 05/01/2015   Overview:  [employer]  . Ischemic cardiomyopathy 06/04/2017  . Left heart failure ([place]) 05/01/2015  . MI (myocardial infarction) ([place]) 05/01/2015  . On amiodarone therapy 08/15/2016  . Pure hypercholesterolemia 05/01/2015  . Stage 3 chronic kidney disease 12/18/2016  . Syncope and collapse 05/01/2015  . Type 2 diabetes mellitus with stage 3 chronic kidney disease, with long-term current use of insulin ([place]) 07/17/2016  . VT (ventricular tachycardia) (  [place]) 05/01/2015    Past Surgical History:  Procedure Laterality Date  . ABDOMINAL HYSTERECTOMY    . ANKLE FRACTURE SURGERY Bilateral    6 months apart, screws  . CARDIAC CATHETERIZATION     2003, 2009, 2016  . CAROTID STENT  06/21/2015   thoracic ao, 4 vessel cerebral arteriogram & rt internal carotid artery pta/stent CPT 35573  . CATARACT EXTRACTION  Right    With lens replaced  . COLONOSCOPY  2013  . CORONARY STENT PLACEMENT    . PACEMAKER PLACEMENT     has two, one working and one not, could not remove old one, Dr. Elonda Husky     Soc Hx - married, lives with husband. Her sister is very attentive and hold HCPOA.   reports that she has never smoked. She has never used smokeless tobacco. She reports that she does not drink alcohol and does not use drugs.  Allergies  Allergen Reactions  . Shellfish Allergy Swelling    Of mouth only  . Sulfa Antibiotics Other (See Comments)    Internal bleeding    Family History  Problem Relation Age of Onset  . Heart attack Father   . Heart disease Father   . Brain cancer Mother   . Diabetes Sister   . Breast cancer Sister   . Rectal cancer Daughter      Prior to Admission medications   Medication Sig Start Date End Date Taking? Authorizing Provider  amiodarone (PACERONE) 200 MG tablet TAKE ONE-HALF TABLET BY  MOUTH ON MONDAY, WEDNESDAY, AND FRIDAY AS DIRECTED Patient taking differently: Take 100 mg by mouth daily.  05/26/20  Yes Camnitz, Ocie Doyne, MD  aspirin EC 81 MG tablet Take 81 mg by mouth daily.   Yes [provider]  atorvastatin (LIPITOR) 80 MG tablet TAKE 1 TABLET BY MOUTH  DAILY Patient taking differently: Take 80 mg by mouth at bedtime.  05/29/20  Yes Richardo Priest, MD  carvedilol (COREG) 6.25 MG tablet TAKE 1 TABLET BY MOUTH  TWICE DAILY Patient taking differently: Take 6.25 mg by mouth 2 (two) times daily with a meal.  05/24/20  Yes Camnitz, Will Hassell Done, MD  clopidogrel (PLAVIX) 75 MG tablet TAKE 1 TABLET BY MOUTH  DAILY Patient taking differently: Take 75 mg by mouth daily.  05/29/20  Yes Richardo Priest, MD  furosemide (LASIX) 20 MG tablet TAKE 1 TABLET BY MOUTH  DAILY Patient taking differently: Take 20 mg by mouth daily.  03/28/20  Yes Richardo Priest, MD  HUMALOG 100 UNIT/ML injection Inject 12 Units into the skin daily.  05/25/17  Yes [provider]   insulin glargine (LANTUS) 100 UNIT/ML injection Inject 10 Units into the skin daily.   Yes [provider]  levothyroxine (SYNTHROID, LEVOTHROID) 100 MCG tablet Take 100 mcg by mouth daily before breakfast.  07/02/17  Yes [provider]  nitroGLYCERIN (NITROLINGUAL) 0.4 MG/SPRAY spray Place 1 spray under the tongue every 5 (five) minutes x 3 doses as needed for chest pain. 04/24/20  Yes Richardo Priest, MD  ranolazine (RANEXA) 500 MG 12 hr tablet Take 500 mg by mouth daily.  11/07/15  Yes [provider]  sacubitril-valsartan (ENTRESTO) 24-26 MG Take 1 tablet by mouth 2 (two) times daily. 11/22/19  Yes Camnitz, Will Hassell Done, MD  sertraline (ZOLOFT) 50 MG tablet Take 50 mg by mouth daily. 10/24/18  Yes [provider]  spironolactone (ALDACTONE) 25 MG tablet TAKE ONE-HALF TABLET BY  MOUTH DAILY Patient taking differently:  Take 12.5 mg by mouth every other day.  05/29/20  Yes Richardo Priest, MD  Vitamin D, Ergocalciferol, (DRISDOL) 1.25 MG (50000 UT) CAPS capsule Take 50,000 Units by mouth every Wednesday.    Yes [provider]  zolpidem (AMBIEN) 10 MG tablet Take 10 mg by mouth at bedtime. 07/31/19  Yes [provider]  Blood Glucose Monitoring Suppl (ACCU-CHEK AVIVA PLUS) w/Device KIT Apply 1 Dose topically daily. 04/07/19   [provider]    Physical Exam: Vitals:   06/18/20 1252 06/18/20 1400  BP: 139/79 135/69  Pulse: 70 79  Resp: 18 14  Temp: (!) 97.4 F (36.3 C)   TempSrc: Oral   SpO2: 100% 99%    Vitals:   06/18/20 1252 06/18/20 1400  BP: 139/79 135/69  Pulse: 70 79  Resp: 18 14  Temp: (!) 97.4 F (36.3 C)   TempSrc: Oral   SpO2: 100% 99%   General:  Thin woman looking older than her chronologic age. Eyes: PERRL, lids and conjunctivae normal ENMT: Mucous membranes are moist. Posterior pharynx clear of any exudate or lesions. Neck: normal, supple, no masses, no thyromegaly Respiratory: clear to auscultation  bilaterally, no wheezing, no crackles. Normal respiratory effort. No accessory muscle use.  Cardiovascular: Regular rate and rhythm, PCM/ICD left upper chest wall, no murmurs / rubs / gallops. No extremity edema. 2+ pedal pulses. No carotid bruits.  Abdomen: no tenderness, no masses palpated. No hepatosplenomegaly. Bowel sounds positive.  Musculoskeletal: no clubbing / cyanosis. No joint deformity upper and lower extremities. Good ROM, no contractures. Decreased muscle tone.  Skin: no rashes, lesions, ulcers. No induration Neurologic: CN 2-12 grossly intact with nl facial symmetry, EOMI. Sensation intact, . Strength 4/5 in all 4.  Psychiatric:  Cannot recall events, seems mildly confused - postictal  Normal mood.     Labs on Admission: I have personally reviewed following labs and imaging studies  CBC: Recent Labs  Lab 06/18/20 1240 06/18/20 1252  WBC 5.1  --   NEUTROABS 3.0  --   HGB 14.4 16.0*  HCT 46.5* 47.0*  MCV 91.0  --   PLT 171  --    Basic Metabolic Panel: Recent Labs  Lab 06/18/20 1240 06/18/20 1252  NA 138 136  K 3.2* 3.1*  CL 98 98  CO2 23  --   GLUCOSE 76 74  BUN 18 21  CREATININE 1.75* 1.60*  CALCIUM 8.7*  --    GFR: CrCl cannot be calculated (Unknown ideal weight.). Liver Function Tests: Recent Labs  Lab 06/18/20 1240  AST 27  ALT 21  ALKPHOS 78  BILITOT 1.6*  PROT 6.6  ALBUMIN 3.3*   No results for input(s): LIPASE, AMYLASE in the last 168 hours. No results for input(s): AMMONIA in the last 168 hours. Coagulation Profile: Recent Labs  Lab 06/18/20 1240  INR 1.1   Cardiac Enzymes: No results for input(s): CKTOTAL, CKMB, CKMBINDEX, TROPONINI in the last 168 hours. BNP (last 3 results) Recent Labs    07/13/19 1040 07/28/19 1134  PROBNP 6,008* 8,503*   HbA1C: No results for input(s): HGBA1C in the last 72 hours. CBG: No results for input(s): GLUCAP in the last 168 hours. Lipid Profile: No results for input(s): CHOL, HDL, LDLCALC,  TRIG, CHOLHDL, LDLDIRECT in the last 72 hours. Thyroid Function Tests: No results for input(s): TSH, T4TOTAL, FREET4, T3FREE, THYROIDAB in the last 72 hours. Anemia Panel: No results for input(s): VITAMINB12, FOLATE, FERRITIN, TIBC, IRON, RETICCTPCT in the last 72 hours.  Urine analysis: No results found for: COLORURINE, APPEARANCEUR, LABSPEC, PHURINE, GLUCOSEU, HGBUR, BILIRUBINUR, KETONESUR, PROTEINUR, UROBILINOGEN, NITRITE, LEUKOCYTESUR  Radiological Exams on Admission: CT HEAD WO CONTRAST  Result Date: 06/18/2020 CLINICAL DATA:  74 year old female with no history of seizures but presentation of diaphoresis, agitated, nonverbal, postictal. EXAM: CT HEAD WITHOUT CONTRAST TECHNIQUE: Contiguous axial images were obtained from the base of the skull through the vertex without intravenous contrast. COMPARISON:  Head CT 02/23/2020. FINDINGS: Brain: Cerebral volume is stable and within normal limits for age. No midline shift, ventriculomegaly, mass effect, evidence of mass lesion, intracranial hemorrhage or evidence of cortically based acute infarction. Stable hypodensity in the left caudate head. Stable and elsewhere generally normal for age gray-white matter differentiation. Minor bilateral white matter hypodensity. No cortical encephalomalacia identified. Vascular: Calcified atherosclerosis at the skull base. No suspicious intracranial vascular hyperdensity. Skull: No acute osseous abnormality identified. Sinuses/Orbits: Visualized paranasal sinuses and mastoids are stable and well pneumatized. Other: No acute orbit or scalp soft tissue finding. Calcified scalp vessel atherosclerosis. IMPRESSION: 1. No acute intracranial abnormality. Mild for age small-vessel disease suspected and stable since June. 2. Calcified scalp vessel atherosclerosis, typically associated with chronic renal disease. Electronically Signed   By: Genevie Ann M.D.   On: 06/18/2020 13:12   DG Chest Port 1 View  Result Date:  06/18/2020 CLINICAL DATA:  74 year old female with seizures, altered mental status. EXAM: PORTABLE CHEST 1 VIEW COMPARISON:  Chest radiographs 11/14/2017 and earlier. CT Abdomen and Pelvis 03/29/2020. FINDINGS: Portable AP semi upright view at 1314 hours. Chronic cardiomegaly. Calcified long segment left ventricular myocardial infarct, which was not apparent radiographically in 2019. See CT appearance in July this year. Stable left chest AICD. Visualized tracheal air column is within normal limits. No pneumothorax. No pleural effusion identified. Increased lung base pulmonary vascular congestion. No consolidation. Chronic benign chondroid lesion of the proximal left humerus. No acute osseous abnormality identified. Paucity of bowel gas in the upper abdomen. IMPRESSION: 1. Cardiomegaly with prominent post-infarction left ventricular mural calcification. 2. Pulmonary vascular congestion at the lung bases. Consider mild or developing interstitial edema. Electronically Signed   By: Genevie Ann M.D.   On: 06/18/2020 13:36    EKG: Independently reviewed. SR, increased PR interval, ST elevation Vi-V5 suggestive of acute posterior and lateral MI but without much change from previous EKGs.  Assessment/Plan Active Problems:   Syncope and collapse   Diarrhea, unspecified   Cardiomyopathy ([place])   Coronary artery disease of native artery of native heart with stable angina pectoris ([place])   ICD (implantable cardioverter-defibrillator) in place   Type 2 diabetes mellitus with stage 3 chronic kidney disease, with long-term current use of insulin ([place])   Seizure ([place])    1. Seizure - witness seizure with post-ictal mental status. Non-focal neuro exam and unremarkable lab except for hypokalemia. Neuro has seen patient Plan Medical tele admit  EEG  Keppra started  Other recommendations per neurology  2. Cardiac - patient with chronic systolic HF with EF 06%. Also with CAD s/p MI and multiple PCI. Follows with Dr.  Baird Kay with last visit in June '21 having missed last two appointments. No apparent decompensation of CHF. No c/o chest pain. EKG without significant change. Has ICD which usually can pace her out of VT and has not had shock. Plan Tele admit  Troponins ordered and pending  BNP ordered and pending.   Caution with IVF - after 300 cc NS will convert to 10 cc/hr  Continue home meds  3.  DM - long term use of basal insulin and sliding scale but recent non-adherence Plan Glycemic protocol with A1C  Continue basal insulin home dose  Sliding scale coverage  4. Diarrhea - long term problem. Patient has declined colonoscopy despite 1st degree relative dying of colon cancer, daughter. No significant electrolyte abnormalities Plan Stool for lactoferrin, calprolectin  TSH  Celiac panel  May need GI consult once Neuro and cardiac stable.  5. EoL care - discussed with patient and her sister (HCPOA) low likelihood of successful resuscitation or survival after resuscitation given her multiple comorbidities. They will talk further but patient remains full code for now.   DVT prophylaxis: heparin  Code Status: full coce  Family Communication: spoke with sister HCPOA who was present during interview and exam.   Disposition Plan: TBD  Consults called: Neurology - Dr. Theda Sers Admission status: inpatient    Adella Hare MD [facility] Pager [phone]  If 7PM-7AM, please contact night-coverage [email or web address] Password [facility]  06/18/2020, 4:29 PM

## 2020-06-18 NOTE — ED Provider Notes (Signed)
[facility] EMERGENCY DEPARTMENT Provider Note   CSN: [id number] Arrival date & time: 06/18/20  1236     History Chief Complaint  Patient presents with  . Seizures    Jenna West is a 74 y.o. female.  HPI Patient presents via EMS after a seizure. The patient herself is incapable of providing any details of today, or her history, level 5 caveat secondary to mental status change/nonverbal status. Per EMS the patient has had recent decline in her health.  She has multiple medical issues including AICD placement, CAD, hypertension. She has no history of seizures. Today, the patient had a seizure which was witnessed by her sister. On EMS arrival the patient was intermittently interactive, listless, with brief bursts of speech. The patient herself does not respond to any my questions, follows commands, slowly, inconsistently.  I was available of the patient's impending arrival prior to that, when EKG was transmitted via telemetry for review. No evidence for STEMI, patient does have AV pacer.   Past Medical History:  Diagnosis Date  . Acquired hypothyroidism 05/01/2015  . AICD (automatic cardioverter/defibrillator) present   . Angina pectoris ([place]) 07/13/2015  . Aspirin long-term use 03/07/2016  . Benign essential hypertension 05/01/2015  . Bilateral carotid artery stenosis 03/07/2016  . 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 Carotid artery stenosis 06/21/2015  . Chronic systolic heart failure ([place]) 05/28/2017  . Coronary arteriosclerosis in native artery 05/01/2015   Overview:  Overview:  PCI and stent to LAD and RCA 2004 for MI DES to LAD for subtotal occlusion with no reflow 8/09  . Coronary artery disease involving native coronary artery of native heart with angina pectoris ([place]) 05/01/2015   Overview:  Overview:  PCI and stent to LAD and RCA 2004 for MI DES to LAD for subtotal occlusion with no reflow 8/09  . Coronary  artery disease of native artery of native heart with stable angina pectoris ([place]) 05/01/2015   Overview:  PCI and stent to LAD and RCA 2004 for MI DES to LAD for subtotal occlusion with no reflow 2009  . Heartburn 05/29/2017  . 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 Hypertensive heart and kidney disease with HF and CKD ([place]) 05/28/2017  . ICD (implantable cardioverter-defibrillator) in place 05/01/2015   Overview:  [employer]  . Ischemic cardiomyopathy 06/04/2017  . Left heart failure ([place]) 05/01/2015  . MI (myocardial infarction) ([place]) 05/01/2015  . On amiodarone therapy 08/15/2016  . Pure hypercholesterolemia 05/01/2015  . Stage 3 chronic kidney disease 12/18/2016  . Syncope and collapse 05/01/2015  . Type 2 diabetes mellitus with stage 3 chronic kidney disease, with long-term current use of insulin ([place]) 07/17/2016  . VT (ventricular tachycardia) ([place]) 05/01/2015    Patient Active Problem List   Diagnosis Date Noted  . Ischemic cardiomyopathy 06/04/2017  . Heartburn 05/29/2017  . Chronic systolic heart failure ([place]) 05/28/2017  . Hypertensive heart and kidney disease with HF and CKD ([place]) 05/28/2017  . Stage 3 chronic kidney disease ([place]) 12/18/2016  . On amiodarone therapy 08/15/2016  . Type 2 diabetes mellitus with stage 3 chronic kidney disease, with long-term current use of insulin ([place]) 07/17/2016  . Aspirin long-term use 03/07/2016  . Bilateral carotid artery stenosis 03/07/2016  . Angina pectoris ([place]) 07/13/2015  . Carotid artery stenosis 06/21/2015  . Acquired hypothyroidism 05/01/2015  . Hypertensive heart  and kidney disease with chronic systolic congestive heart failure and stage 4 chronic kidney disease ([place]) 05/01/2015  . Cardiomyopathy ([place]) 05/01/2015  . Coronary artery disease involving native coronary artery of native heart with angina pectoris ([place]) 05/01/2015  . Coronary  artery disease of native artery of native heart with stable angina pectoris ([place]) 05/01/2015  . ICD (implantable cardioverter-defibrillator) in place 05/01/2015  . Left heart failure ([place]) 05/01/2015  . MI (myocardial infarction) ([place]) 05/01/2015  . Pure hypercholesterolemia 05/01/2015  . Syncope and collapse 05/01/2015  . VT (ventricular tachycardia) ([place]) 05/01/2015  . Coronary arteriosclerosis in native artery 05/01/2015    Past Surgical History:  Procedure Laterality Date  . ABDOMINAL HYSTERECTOMY    . ANKLE FRACTURE SURGERY Bilateral    6 months apart, screws  . CARDIAC CATHETERIZATION     2003, 2009, 2016  . CAROTID STENT  06/21/2015   thoracic ao, 4 vessel cerebral arteriogram & rt internal carotid artery pta/stent CPT 66294  . CATARACT EXTRACTION Right    With lens replaced  . COLONOSCOPY  2013  . CORONARY STENT PLACEMENT    . PACEMAKER PLACEMENT     has two, one working and one not, could not remove old one, Dr. Elonda Husky      OB History   No obstetric history on file.     Family History  Problem Relation Age of Onset  . Heart attack Father   . Heart disease Father   . Brain cancer Mother   . Diabetes Sister   . Breast cancer Sister   . Rectal cancer Daughter     Social History   Tobacco Use  . Smoking status: Never Smoker  . Smokeless tobacco: Never Used  Vaping Use  . Vaping Use: Never used  Substance Use Topics  . Alcohol use: No  . Drug use: No    Home Medications Prior to Admission medications   Medication Sig Start Date End Date Taking? Authorizing Provider  amiodarone (PACERONE) 200 MG tablet TAKE ONE-HALF TABLET BY  MOUTH ON MONDAY, WEDNESDAY, AND FRIDAY AS DIRECTED Patient taking differently: Take 100 mg by mouth daily.  05/26/20  Yes Camnitz, Ocie Doyne, MD  aspirin EC 81 MG tablet Take 81 mg by mouth daily.   Yes [provider]  atorvastatin (LIPITOR) 80 MG tablet TAKE 1 TABLET BY MOUTH  DAILY Patient taking differently: Take 80  mg by mouth at bedtime.  05/29/20  Yes Richardo Priest, MD  carvedilol (COREG) 6.25 MG tablet TAKE 1 TABLET BY MOUTH  TWICE DAILY Patient taking differently: Take 6.25 mg by mouth 2 (two) times daily with a meal.  05/24/20  Yes Camnitz, Will Hassell Done, MD  clopidogrel (PLAVIX) 75 MG tablet TAKE 1 TABLET BY MOUTH  DAILY Patient taking differently: Take 75 mg by mouth daily.  05/29/20  Yes Richardo Priest, MD  furosemide (LASIX) 20 MG tablet TAKE 1 TABLET BY MOUTH  DAILY Patient taking differently: Take 20 mg by mouth daily.  03/28/20  Yes Richardo Priest, MD  HUMALOG 100 UNIT/ML injection Inject 12 Units into the skin daily.  05/25/17  Yes [provider]  insulin glargine (LANTUS) 100 UNIT/ML injection Inject 10 Units into the skin daily.   Yes [provider]  levothyroxine (SYNTHROID, LEVOTHROID) 100 MCG tablet Take 100 mcg by mouth daily before breakfast.  07/02/17  Yes [provider]  nitroGLYCERIN (NITROLINGUAL) 0.4 MG/SPRAY spray Place 1 spray under the tongue every 5 (five) minutes x 3 doses  as needed for chest pain. 04/24/20  Yes Richardo Priest, MD  ranolazine (RANEXA) 500 MG 12 hr tablet Take 500 mg by mouth daily.  11/07/15  Yes [provider]  sacubitril-valsartan (ENTRESTO) 24-26 MG Take 1 tablet by mouth 2 (two) times daily. 11/22/19  Yes Camnitz, Will Hassell Done, MD  sertraline (ZOLOFT) 50 MG tablet Take 50 mg by mouth daily. 10/24/18  Yes [provider]  spironolactone (ALDACTONE) 25 MG tablet TAKE ONE-HALF TABLET BY  MOUTH DAILY Patient taking differently: Take 12.5 mg by mouth every other day.  05/29/20  Yes Richardo Priest, MD  Vitamin D, Ergocalciferol, (DRISDOL) 1.25 MG (50000 UT) CAPS capsule Take 50,000 Units by mouth every Wednesday.    Yes [provider]  zolpidem (AMBIEN) 10 MG tablet Take 10 mg by mouth at bedtime. 07/31/19  Yes [provider]  Blood Glucose Monitoring Suppl (ACCU-CHEK AVIVA PLUS) w/Device KIT Apply 1 Dose  topically daily. 04/07/19   [provider]    Allergies    Shellfish allergy and Sulfa antibiotics  Review of Systems   Review of Systems  Unable to perform ROS: Mental status change    Physical Exam Updated Vital Signs BP 135/69 Comment: rooom air  Pulse 79 Comment: rooom air  Temp (!) 97.4 F (36.3 C) (Oral)   Resp 14 Comment: rooom air  SpO2 99% Comment: rooom air  Physical Exam Vitals and nursing note reviewed.  Constitutional:      Appearance: She is well-developed. She is ill-appearing.     Comments: Listless elderly female  HENT:     Head: Normocephalic and atraumatic.  Eyes:     Conjunctiva/sclera: Conjunctivae normal.     Comments: Follows extraocular motion commands inconsistently  Cardiovascular:     Rate and Rhythm: Normal rate and regular rhythm.     Comments: AV paced rhythm appreciable on monitor Pulmonary:     Effort: Pulmonary effort is normal. No respiratory distress.     Breath sounds: Normal breath sounds. No stridor.  Abdominal:     General: There is no distension.  Skin:    General: Skin is warm and dry.  Neurological:     Motor: Atrophy present.     Comments: Nonverbal, nonverbal.  She does respond to stimuli, and does follow some commands inconsistently including both arms to command. Distal reflexes unremarkable.     ED Results / Procedures / Treatments   Labs (all labs ordered are listed, but only abnormal results are displayed) Labs Reviewed  COMPREHENSIVE METABOLIC PANEL - Abnormal; Notable for the following components:      Result Value   Potassium 3.2 (*)    Creatinine, Ser 1.75 (*)    Calcium 8.7 (*)    Albumin 3.3 (*)    Total Bilirubin 1.6 (*)    GFR calc non Af Amer 28 (*)    GFR calc Af Amer 33 (*)    Anion gap 17 (*)    All other components within normal limits  CBC WITH DIFFERENTIAL/PLATELET - Abnormal; Notable for the following components:   HCT 46.5 (*)    RDW 15.7 (*)    All other components within  normal limits  I-STAT CHEM 8, ED - Abnormal; Notable for the following components:   Potassium 3.1 (*)    Creatinine, Ser 1.60 (*)    Calcium, Ion 0.95 (*)    Hemoglobin 16.0 (*)    HCT 47.0 (*)    All other components within normal limits  RESP PANEL BY RT PCR (RSV, FLU A&B, COVID)  PROTIME-INR  RAPID URINE DRUG SCREEN, HOSP PERFORMED  ETHANOL  URINALYSIS, COMPLETE (UACMP) WITH MICROSCOPIC  BRAIN NATRIURETIC PEPTIDE  CBG MONITORING, ED  TROPONIN I (HIGH SENSITIVITY)    EMS EKG with rate 72, AV paced rhythm, artifact, abnormal  EKG EKG Interpretation  Date/Time:  Sunday June 18 2020 12:48:53 EDT Ventricular Rate:  70 PR Interval:    QRS Duration: 88 QT Interval:  340 QTC Calculation: 367 R Axis:   105 Text Interpretation: AV SEQUENTIAL PACEMAKER Abnormal ECG Confirmed by Carmin Muskrat [phone]) on 06/18/2020 2:20:49 PM   Radiology CT HEAD WO CONTRAST  Result Date: 06/18/2020 CLINICAL DATA:  74 year old female with no history of seizures but presentation of diaphoresis, agitated, nonverbal, postictal. EXAM: CT HEAD WITHOUT CONTRAST TECHNIQUE: Contiguous axial images were obtained from the base of the skull through the vertex without intravenous contrast. COMPARISON:  Head CT 02/23/2020. FINDINGS: Brain: Cerebral volume is stable and within normal limits for age. No midline shift, ventriculomegaly, mass effect, evidence of mass lesion, intracranial hemorrhage or evidence of cortically based acute infarction. Stable hypodensity in the left caudate head. Stable and elsewhere generally normal for age gray-white matter differentiation. Minor bilateral white matter hypodensity. No cortical encephalomalacia identified. Vascular: Calcified atherosclerosis at the skull base. No suspicious intracranial vascular hyperdensity. Skull: No acute osseous abnormality identified. Sinuses/Orbits: Visualized paranasal sinuses and mastoids are stable and well pneumatized. Other: No acute orbit or  scalp soft tissue finding. Calcified scalp vessel atherosclerosis. IMPRESSION: 1. No acute intracranial abnormality. Mild for age small-vessel disease suspected and stable since June. 2. Calcified scalp vessel atherosclerosis, typically associated with chronic renal disease. Electronically Signed   By: Genevie Ann M.D.   On: 06/18/2020 13:12   DG Chest Port 1 View  Result Date: 06/18/2020 CLINICAL DATA:  74 year old female with seizures, altered mental status. EXAM: PORTABLE CHEST 1 VIEW COMPARISON:  Chest radiographs 11/14/2017 and earlier. CT Abdomen and Pelvis 03/29/2020. FINDINGS: Portable AP semi upright view at 1314 hours. Chronic cardiomegaly. Calcified long segment left ventricular myocardial infarct, which was not apparent radiographically in 2019. See CT appearance in July this year. Stable left chest AICD. Visualized tracheal air column is within normal limits. No pneumothorax. No pleural effusion identified. Increased lung base pulmonary vascular congestion. No consolidation. Chronic benign chondroid lesion of the proximal left humerus. No acute osseous abnormality identified. Paucity of bowel gas in the upper abdomen. IMPRESSION: 1. Cardiomegaly with prominent post-infarction left ventricular mural calcification. 2. Pulmonary vascular congestion at the lung bases. Consider mild or developing interstitial edema. Electronically Signed   By: Genevie Ann M.D.   On: 06/18/2020 13:36    Procedures Procedures (including critical care time)  Medications Ordered in ED Medications  sodium chloride 0.9 % bolus 1,000 mL (1,000 mLs Intravenous New Bag/Given 06/18/20 1425)    And  0.9 %  sodium chloride infusion ( Intravenous New Bag/Given 06/18/20 1423)  levETIRAcetam (KEPPRA) IVPB 500 mg/100 mL premix (500 mg Intravenous New Bag/Given 06/18/20 1424)    ED Course  I have reviewed the triage vital signs and the nursing notes.  Pertinent labs & imaging results that were available during my care of the  patient were reviewed by me and considered in my medical decision making (see chart for details).     Immediately after arrival with consideration of elderly female with new seizure activity, broad differential including infection, hemorrhage, arrhythmia, hypoxia, electrolyte abnormalities all considered, appropriate labs, imaging ordered. Patient  started on Keppra bolus, placed on continuous monitoring.  Update: I discussed patient case with neurology colleagues.  Update:, Patient in similar condition.  3:32 PM Patient now speaking with our neurology consultants. I discussed her case with our internal medicine colleagues for admission as well. Labs generally unremarkable aside from mild elevation in creatinine, mild hypokalemia. CT head without obvious pathology suggesting etiology for her seizures.    MDM Rules/Calculators/A&P  Elderly female presents from home after witnessed seizure. Here the patient is noticeably postictal, but eventually does begin to speak. Patient's evaluation has multiple mild abnormalities, including renal dysfunction, hypokalemia, but initial head CT is reassuring. Patient's cardiac history, pacemaker/defibrillator prohibits her from receiving MRI for evaluation. Per neurology, patient will require repeat CT, and consultation on their part. Patient required admission to our internal medicine colleagues for appropriate ongoing monitoring, further investigation.   Covid negative  Final Clinical Impression(s) / ED Diagnoses Final diagnoses:  Seizure [facility])     Carmin Muskrat, MD 06/18/20 1534

## 2020-06-18 NOTE — ED Notes (Signed)
Called sister @ [phone] and got update on pt.  Advised I would call back with update. Per sister, pt has not been taking medications until the past several days, as husband is watching pt take medications now.   Has had numerous falls in the past 18 months.   Chronic diarrhea for past 9 months, has lost 50 lbs.   Has lost manual dexterity to hands since April 2021.

## 2020-06-19 ENCOUNTER — Inpatient Hospital Stay (HOSPITAL_COMMUNITY): Payer: Medicare Other

## 2020-06-19 ENCOUNTER — Telehealth: Payer: Self-pay

## 2020-06-19 ENCOUNTER — Other Ambulatory Visit: Payer: Self-pay

## 2020-06-19 DIAGNOSIS — R569 Unspecified convulsions: Secondary | ICD-10-CM

## 2020-06-19 LAB — CBG MONITORING, ED
Glucose-Capillary: 84 mg/dL (ref 70–99)
Glucose-Capillary: 103 mg/dL — ABNORMAL HIGH (ref 70–99)
Glucose-Capillary: 70 mg/dL (ref 70–99)
Glucose-Capillary: 69 mg/dL — ABNORMAL LOW (ref 70–99)
Glucose-Capillary: 155 mg/dL — ABNORMAL HIGH (ref 70–99)
Glucose-Capillary: 159 mg/dL — ABNORMAL HIGH (ref 70–99)
Glucose-Capillary: 173 mg/dL — ABNORMAL HIGH (ref 70–99)

## 2020-06-19 LAB — GLUCOSE, CAPILLARY: Glucose-Capillary: 157 mg/dL — ABNORMAL HIGH (ref 70–99)

## 2020-06-19 MED ORDER — POTASSIUM CHLORIDE CRYS ER 20 MEQ PO TBCR
40.0000 meq | EXTENDED_RELEASE_TABLET | Freq: Two times a day (BID) | ORAL | Status: DC
  Administered 2020-06-19 – 2020-06-20 (×3): 40 meq via ORAL
  Filled 2020-06-19 (×3): qty 2

## 2020-06-19 MED ORDER — FUROSEMIDE 10 MG/ML IJ SOLN
40.0000 mg | Freq: Two times a day (BID) | INTRAMUSCULAR | Status: DC
  Administered 2020-06-19: 40 mg via INTRAVENOUS
  Filled 2020-06-19 (×2): qty 4

## 2020-06-19 MED ORDER — LORAZEPAM 2 MG/ML IJ SOLN
1.0000 mg | Freq: Once | INTRAMUSCULAR | Status: AC
  Administered 2020-06-19: 1 mg via INTRAVENOUS
  Filled 2020-06-19: qty 1

## 2020-06-19 NOTE — ED Notes (Signed)
Patient transported to CT 

## 2020-06-19 NOTE — ED Notes (Signed)
Patient CBG was 155.

## 2020-06-19 NOTE — Progress Notes (Signed)
EEG completed, results pending. 

## 2020-06-19 NOTE — ED Notes (Signed)
Orange juice given

## 2020-06-19 NOTE — ED Notes (Signed)
CBG=159  

## 2020-06-19 NOTE — Progress Notes (Signed)
Subjective: No further seizure-like episodes since admission.  Patient states she remembers standing up and then suddenly passing out, denies feeling lightheaded, palpitations prior to the episode.  I called and spoke with patient's husband who states patient has been having falls since about 8 months ago.  States she usually gets up and walks and falls in the bathroom and he is concerned that it is because she is in a rush.  He denies any head injury, loss of consciousness.  He also states that patient's cardiologist told her that in the last few months he has had multiple episodes of tachycardia.  Also spoke with patient Sister [name] at bedside who was with patient when the episode happened.  She states patient was sitting waiting for dinner and suddenly her eyes rolled in the back of her head followed by whole body shaking, no urinary incontinence, no tongue bite lasted for about 2 minutes after which patient was "postictal".  Patient sister [name] also states that patient has been having multiple falls in the last 1 year.  She also has episodes of diarrhea and has been losing weight.  Patient's daughter died of colon cancer.  Patient was scheduled for colonoscopy but took Plavix before the procedure in order to avoid the procedure.  Patient sister [name] is concerned that patient has not been taking care of herself and not taking any of her prescribed medications since April.  ROS: negative except above  Examination  Vital signs in last 24 hours: Pulse Rate:  [59-79] 70 (10/04 1254) Resp:  [13-21] 16 (10/04 1254) BP: (93-135)/(41-76) 109/61 (10/04 1254) SpO2:  [88 %-100 %] 100 % (10/04 1254) Weight:  [61.2 kg] 61.2 kg (10/04 0436)  General: lying in bed, not in apparent distress CVS: pulse-normal rate and rhythm RS: breathing comfortably, CTA B Extremities: normal, warm  Neuro: MS: Alert, oriented to self and place, not to time, follows commands, able to do simple mathematics like 2+2 but  has difficulty with attention CN: pupils equal and reactive,  EOMI, face symmetric, tongue midline, normal sensation over face, Motor: 5/5 strength in all 4 extremities Reflexes: 2+ bilaterally over patella, biceps, plantars: flexor Coordination: normal Gait: not tested  Basic Metabolic Panel: Recent Labs  Lab 06/18/20 1240 06/18/20 1252  NA 138 136  K 3.2* 3.1*  CL 98 98  CO2 23  --   GLUCOSE 76 74  BUN 18 21  CREATININE 1.75* 1.60*  CALCIUM 8.7*  --     CBC: Recent Labs  Lab 06/18/20 1240 06/18/20 1252  WBC 5.1  --   NEUTROABS 3.0  --   HGB 14.4 16.0*  HCT 46.5* 47.0*  MCV 91.0  --   PLT 171  --      Coagulation Studies: Recent Labs    06/18/20 1240  LABPROT 13.5  INR 1.1    Imaging CT head without contrast 06/18/2020: No acute abnormality.  ASSESSMENT AND PLAN: 74 year old female who presented after a fall and seizure-like activity.  Routine EEG did not show any epileptiform activity.  CTA did not show any acute abnormality.   Syncope First-time unprovoked seizure Diabetes Hypertension Chronic systolic heart failure due to ischemic cardiomyopathy Coronary artery disease with chronic stable angina Ventricular tachycardia Moderate sleep apnea Hypothyroidism Hypokalemia AKI Hypoalbuminemia Hyperbilirubinemia -Patient most likely had first-time seizure.  No clear provoking factors were noted (normal electrolytes, no hypoglycemia, normal blood pressure, no recent head injury, normal CT head). -Unfortunately patient is unable to obtain MRI brain due to an  ICD which is MRI not compatible.  Recommendations -As this was a first-time unprovoked seizure, will not start AEDs at this point. -However, discussed that patient will likely benefit from evaluation for dementia as an outpatient which can increase patient's risk of seizures/epilepsy. -Continue seizure precautions including do not drive - Recommend follow-up with neurology outpatient in 8 to 12  weeks. -Defer management of rest of the comorbidities per primary team   Seizure precautions: Per [facility] statutes, patients with seizures are not allowed to drive until they have been seizure-free for six months and cleared by a physician    Use caution when using heavy equipment or power tools. Avoid working on ladders or at heights. Take showers instead of baths. Ensure the water temperature is not too high on the home water heater. Do not go swimming alone. Do not lock yourself in a room alone (i.e. bathroom). When caring for infants or small children, sit down when holding, feeding, or changing them to minimize risk of injury to the child in the event you have a seizure. Maintain good sleep hygiene. Avoid alcohol.    If patient has another seizure, call 911 and bring them back to the ED if: A.  The seizure lasts longer than 5 minutes.      B.  The patient doesn't wake shortly after the seizure or has new problems such as difficulty seeing, speaking or moving following the seizure C.  The patient was injured during the seizure D.  The patient has a temperature over 102 F (39C) E.  The patient vomited during the seizure and now is having trouble breathing    During the Seizure   - First, ensure adequate ventilation and place patients on the floor on their left side  Loosen clothing around the neck and ensure the airway is patent. If the patient is clenching the teeth, do not force the mouth open with any object as this can cause severe damage - Remove all items from the surrounding that can be hazardous. The patient may be oblivious to what's happening and may not even know what he or she is doing. If the patient is confused and wandering, either gently guide him/her away and block access to outside areas - Reassure the individual and be comforting - Call 911. In most cases, the seizure ends before EMS arrives. However, there are cases when seizures may last over 3 to 5 minutes.  Or the individual may have developed breathing difficulties or severe injuries. If a pregnant patient or a person with diabetes develops a seizure, it is prudent to call an ambulance. - Finally, if the patient does not regain full consciousness, then call EMS. Most patients will remain confused for about 45 to 90 minutes after a seizure, so you must use judgment in calling for help. - Avoid restraints but make sure the patient is in a bed with padded side rails - Place the individual in a lateral position with the neck slightly flexed; this will help the saliva drain from the mouth and prevent the tongue from falling backward - Remove all nearby furniture and other hazards from the area - Provide verbal assurance as the individual is regaining consciousness - Provide the patient with privacy if possible - Call for help and start treatment as ordered by the caregiver    After the Seizure (Postictal Stage)   After a seizure, most patients experience confusion, fatigue, muscle pain and/or a headache. Thus, one should permit the individual to  sleep. For the next few days, reassurance is essential. Being calm and helping reorient the person is also of importance.   Most seizures are painless and end spontaneously. Seizures are not harmful to others but can lead to complications such as stress on the lungs, brain and the heart. Individuals with prior lung problems may develop labored breathing and respiratory distress.   I have spent a total of  45  minutes with the patient reviewing hospital notes,  test results, labs and examining the patient as well as establishing an assessment and plan that was discussed personally with the patient, patient's husband and patient's Sister [name].  > 50% of time was spent in direct patient care.   [facility] Epile[facility] For questions after 5pm please refer to AMION to reach the Neurologist on call

## 2020-06-19 NOTE — Telephone Encounter (Signed)
The pt sister [name](dpr) called to see if the pt ICD is MRI compatible. Per Amy, Rn she is not MRI compatible.

## 2020-06-19 NOTE — ED Notes (Signed)
Graham crackers and OJ given.

## 2020-06-19 NOTE — ED Notes (Signed)
Neurologist at bedside. Pt remains confused at times.

## 2020-06-19 NOTE — Progress Notes (Signed)
Pt very confused, impulsive and agitated at shift change, refusing to stay in bed, Dr Tonie Griffith (on call) paged and notified, ordered iv ativan 1mg , same given alongside her night medication as ordered, husband called on phone who spoke to pt, pt and husband reassured, will however continue to monitor. Obasogie-Asidi, Radonna Bracher Efe

## 2020-06-19 NOTE — Evaluation (Signed)
Physical Therapy Evaluation Patient Details Name: Jenna West MRN: [id number] DOB: [birth year] Today's Date: 06/19/2020   History of Present Illness  Pt is a 74 y/o female admitted secondary to seizure like activity. Pt is s/p EEG that was suggestive of moderate diffuse encephalopathy. PMH includes CAD, DM, CHF, s/p ICD.   Clinical Impression  Pt admitted secondary to problem above with deficits below. Pt impulsive and requiring safety cues for safe gait speed within the room. Min guard for safety. Pt with cognitive deficits as well, however, unsure of baseline. Was disoriented to time and had decreased safety awareness. Per notes, pt with hx of multiple falls, so feel she would benefit from HHPT at d/c. Will continue to follow acutely to maximize functional mobility independence and safety.     Follow Up Recommendations Home health PT;Supervision/Assistance - 24 hour    Equipment Recommendations  None recommended by PT    Recommendations for Other Services       Precautions / Restrictions Precautions Precautions: Fall Restrictions Weight Bearing Restrictions: No      Mobility  Bed Mobility Overal bed mobility: Needs Assistance Bed Mobility: Supine to Sit;Sit to Supine     Supine to sit: Supervision Sit to supine: Supervision   General bed mobility comments: Supervision for safety.   Transfers Overall transfer level: Needs assistance Equipment used: None Transfers: Sit to/from Stand Sit to Stand: Min guard         General transfer comment: Min guard for safety.   Ambulation/Gait Ambulation/Gait assistance: Min guard Gait Distance (Feet): 15 Feet Assistive device: None Gait Pattern/deviations: Step-through pattern;Decreased stride length Gait velocity: Too fast for safety.    General Gait Details: Slightly impulsive and required cues to slow speed as to not get tangled in her monitor wires. No LOB noted.   Stairs            Wheelchair Mobility     Modified Rankin (Stroke Patients Only)       Balance Overall balance assessment: Needs assistance Sitting-balance support: No upper extremity supported;Feet supported Sitting balance-Leahy Scale: Fair     Standing balance support: No upper extremity supported;During functional activity Standing balance-Leahy Scale: Fair                               Pertinent Vitals/Pain Pain Assessment: No/denies pain    Home Living Family/patient expects to be discharged to:: Private residence Living Arrangements: Spouse/significant other Available Help at Discharge: Family;Available 24 hours/day Type of Home: House Home Access: Stairs to enter Entrance Stairs-Rails: None Entrance Stairs-Number of Steps: 1 (porch step) Home Layout: Two level;Able to live on main level with bedroom/bathroom Home Equipment: Cane - single point;Grab bars - toilet      Prior Function Level of Independence: Needs assistance   Gait / Transfers Assistance Needed: Pt independent with ambulation  ADL's / Homemaking Assistance Needed: Husband provides supervision as she has fallen out of the tub        Hand Dominance        Extremity/Trunk Assessment   Upper Extremity Assessment Upper Extremity Assessment: Generalized weakness    Lower Extremity Assessment Lower Extremity Assessment: Generalized weakness    Cervical / Trunk Assessment Cervical / Trunk Assessment: Normal  Communication   Communication: No difficulties  Cognition Arousal/Alertness: Awake/alert Behavior During Therapy: Impulsive Overall Cognitive Status: No family/caregiver present to determine baseline cognitive functioning  General Comments: Pt disoriented to time, saying it was June of 1922. Pt slightly impulsive and required safety cues throughout. Unsure of baseline.       General Comments General comments (skin integrity, edema, etc.): No family present      Exercises     Assessment/Plan    PT Assessment Patient needs continued PT services  PT Problem List Decreased strength;Decreased balance;Decreased mobility;Decreased cognition;Decreased knowledge of use of DME;Decreased safety awareness;Decreased knowledge of precautions       PT Treatment Interventions Gait training;Stair training;Functional mobility training;Therapeutic activities;Therapeutic exercise;Patient/family education;Balance training;DME instruction    PT Goals (Current goals can be found in the Care Plan section)  Acute Rehab PT Goals Patient Stated Goal: to go home PT Goal Formulation: With patient Time For Goal Achievement: 07/03/20 Potential to Achieve Goals: Good    Frequency Min 3X/week   Barriers to discharge        Co-evaluation               AM-PAC PT "6 Clicks" Mobility  Outcome Measure Help needed turning from your back to your side while in a flat bed without using bedrails?: None Help needed moving from lying on your back to sitting on the side of a flat bed without using bedrails?: None Help needed moving to and from a bed to a chair (including a wheelchair)?: A Little Help needed standing up from a chair using your arms (e.g., wheelchair or bedside chair)?: A Little Help needed to walk in hospital room?: A Little Help needed climbing 3-5 steps with a railing? : A Lot 6 Click Score: 19    End of Session Equipment Utilized During Treatment: Gait belt Activity Tolerance: Patient tolerated treatment well Patient left: in bed;with call bell/phone within reach (on stretcher in ED ) Nurse Communication: Mobility status PT Visit Diagnosis: History of falling (Z91.81);Muscle weakness (generalized) (M62.81);Repeated falls (R29.6)    Time: 1975-8832 PT Time Calculation (min) (ACUTE ONLY): 14 min   Charges:   PT Evaluation $PT Eval Moderate Complexity: 1 Mod          Reuel Derby, PT, DPT  Acute Rehabilitation Services  Pager: [phone] Office: [phone]   Rudean Hitt 06/19/2020, 4:47 PM

## 2020-06-19 NOTE — Progress Notes (Signed)
[facility] PROGRESS NOTE    Jenna West  CXK:[id number] DOB: [birth year] DOA: 06/18/2020 PCP: Ronita Hipps, MD      Brief Narrative:  Jenna West is a 74 y.o. 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  No prior history of seizures, but was with her sister on day of admission when she had LOC and generalized shaking, was post-ictal after.    In the ER, CXR showed edema.  MRI brain could not be performed due to ICD.  She was given fluids, loaded with Keppra and admitted for EEG.        Assessment & Plan:  New onset seizure -Consult Neurology, appreciate cares -Follow EEG -Repeat CT head -Continue Keppra   Acute on chronic systolic CHF BNP elevated, edema on CXR, cardiomegaly on CXR and hypoxic. -Stop fluids -Furosemide 40 mg IV twice a day  -K supplement -Strict I/Os, daily weights, telemetry  -Daily monitoring renal function -Continue carvedilol, Entresto, spiro  Ischemic cardiomyopathy c/b VT with ICD -Continue amiodarone -Continue carvedilol -Continue aspirin, clopidogrel, statin -Continue Ranexa   Hypothyroidism -Continue levothyroxine  CKD IIIb Cr close to baseline  Hypokalemia -Supplement K as above  Diabetes Glucoses controlled -Continue Lantus -Continue SS correction insulin       Disposition: Status is: Inpatient  Remains inpatient appropriate because:IV treatments appropriate due to intensity of illness or inability to take PO   Dispo: The patient is from: Home              Anticipated d/c is to: Home              Anticipated d/c date is: 1 day              Patient currently is not medically stable to d/c.              MDM: The below labs and imaging reports were reviewed and summarized above.  Medication management as above.   DVT prophylaxis: heparin injection 5,000 Units Start: 06/18/20 1630  Code  Status: FULL Family Communication:            Subjective: Feeling tired, but no seizures overnight.  No swelling orthopnea.  No chest pain.  No vomiting.    Objective: Vitals:   06/19/20 0530 06/19/20 0600 06/19/20 0630 06/19/20 0700  BP: (!) 93/41 102/65 (!) 114/55 112/62  Pulse: 60 77 69 69  Resp:      Temp:      TempSrc:      SpO2: 93% 97% 93% 96%  Weight:      Height:        Intake/Output Summary (Last 24 hours) at 06/19/2020 1031 Last data filed at 06/18/2020 1720 Gross per 24 hour  Intake 1415.33 ml  Output --  Net 1415.33 ml   Filed Weights   06/19/20 0436  Weight: 61.2 kg    Examination: General appearance:  adult female, alert and in no obvious distress.   HEENT: Anicteric, conjunctiva pink, lids and lashes normal. No nasal deformity, discharge, epistaxis.  Lips moist.   Skin: Warm and dry.  no jaundice.  No suspicious rashes or lesions. Cardiac: RRR, nl S1-S2, no murmurs appreciated.  Capillary refill is brisk.  JVP not visible.  No LE edema.  Radial pulses 2+ and symmetric. Respiratory: Normal respiratory rate and rhythm.Diminished at bases, no wheezing Abdomen: Abdomen soft.  no TTP.  No ascites, distension, hepatosplenomegaly.   MSK: No deformities or effusions. Neuro: Awake and alert.  EOMI, moves all extremities. Speech fluent.    Psych: Sensorium intact and responding to questions, attention normal. Affect normal.  Judgment and insight appear normal.    Data Reviewed: I have personally reviewed following labs and imaging studies:  CBC: Recent Labs  Lab 06/18/20 1240 06/18/20 1252  WBC 5.1  --   NEUTROABS 3.0  --   HGB 14.4 16.0*  HCT 46.5* 47.0*  MCV 91.0  --   PLT 171  --    Basic Metabolic Panel: Recent Labs  Lab 06/18/20 1240 06/18/20 1252  NA 138 136  K 3.2* 3.1*  CL 98 98  CO2 23  --   GLUCOSE 76 74  BUN 18 21  CREATININE 1.75* 1.60*  CALCIUM 8.7*  --    GFR: Estimated Creatinine Clearance: 26.6 mL/min (A) (by C-G  formula based on SCr of 1.6 mg/dL (H)). Liver Function Tests: Recent Labs  Lab 06/18/20 1240  AST 27  ALT 21  ALKPHOS 78  BILITOT 1.6*  PROT 6.6  ALBUMIN 3.3*   No results for input(s): LIPASE, AMYLASE in the last 168 hours. No results for input(s): AMMONIA in the last 168 hours. Coagulation Profile: Recent Labs  Lab 06/18/20 1240  INR 1.1   Cardiac Enzymes: No results for input(s): CKTOTAL, CKMB, CKMBINDEX, TROPONINI in the last 168 hours. BNP (last 3 results) Recent Labs    07/13/19 1040 07/28/19 1134  PROBNP 6,008* 8,503*   HbA1C: Recent Labs    06/18/20 1718  HGBA1C 6.6*   CBG: Recent Labs  Lab 06/18/20 2326 06/19/20 0809 06/19/20 0848  GLUCAP 104* 70 69*   Lipid Profile: No results for input(s): CHOL, HDL, LDLCALC, TRIG, CHOLHDL, LDLDIRECT in the last 72 hours. Thyroid Function Tests: Recent Labs    06/18/20 2230  TSH 3.705   Anemia Panel: No results for input(s): VITAMINB12, FOLATE, FERRITIN, TIBC, IRON, RETICCTPCT in the last 72 hours. Urine analysis:    Component Value Date/Time   COLORURINE YELLOW 06/18/2020 1919   APPEARANCEUR CLEAR 06/18/2020 1919   LABSPEC 1.011 06/18/2020 1919   PHURINE 6.0 06/18/2020 1919   GLUCOSEU NEGATIVE 06/18/2020 1919   HGBUR SMALL (A) 06/18/2020 [place] NEGATIVE 06/18/2020 [place] NEGATIVE 06/18/2020 1919   PROTEINUR 30 (A) 06/18/2020 1919   NITRITE NEGATIVE 06/18/2020 1919   LEUKOCYTESUR TRACE (A) 06/18/2020 1919   Sepsis Labs: @LABRCNTIP (procalcitonin:4,lacticacidven:4)  ) Recent Results (from the past 240 hour(s))  Resp Panel by RT PCR (RSV, Flu A&B, Covid) - Nasopharyngeal Swab     Status: None   Collection Time: 06/18/20  1:18 PM   Specimen: Nasopharyngeal Swab  Result Value Ref Range Status   SARS Coronavirus 2 by RT PCR NEGATIVE NEGATIVE Final    Comment: (NOTE) SARS-CoV-2 target nucleic acids are NOT DETECTED.  The SARS-CoV-2 RNA is generally detectable in upper  respiratoy specimens during the acute phase of infection. The lowest concentration of SARS-CoV-2 viral copies this assay can detect is 131 copies/mL. A negative result does not preclude SARS-Cov-2 infection and should not be used as the sole basis for treatment or other patient management decisions. A negative result may occur with  improper specimen collection/handling, submission of specimen other than nasopharyngeal swab, presence of viral mutation(s) within the areas targeted by this assay, and inadequate number of viral copies (<131 copies/mL). A negative result must be combined with clinical observations, patient history, and  epidemiological information. The expected result is Negative.  Fact Sheet for Patients:  PinkCheek.be  Fact Sheet for Healthcare Providers:  GravelBags.it  This test is no t yet approved or cleared by the [place] FDA and  has been authorized for detection and/or diagnosis of SARS-CoV-2 by FDA under an Emergency Use Authorization (EUA). This EUA will remain  in effect (meaning this test can be used) for the duration of the COVID-19 declaration under Section 564(b)(1) of the Act, 21 U.S.C. section 360bbb-3(b)(1), unless the authorization is terminated or revoked sooner.     Influenza A by PCR NEGATIVE NEGATIVE Final   Influenza B by PCR NEGATIVE NEGATIVE Final    Comment: (NOTE) The Xpert Xpress SARS-CoV-2/FLU/RSV assay is intended as an aid in  the diagnosis of influenza from Nasopharyngeal swab specimens and  should not be used as a sole basis for treatment. Nasal washings and  aspirates are unacceptable for Xpert Xpress SARS-CoV-2/FLU/RSV  testing.  Fact Sheet for Patients: PinkCheek.be  Fact Sheet for Healthcare Providers: GravelBags.it  This test is not yet approved or cleared by the [place] FDA and  has been  authorized for detection and/or diagnosis of SARS-CoV-2 by  FDA under an Emergency Use Authorization (EUA). This EUA will remain  in effect (meaning this test can be used) for the duration of the  Covid-19 declaration under Section 564(b)(1) of the Act, 21  U.S.C. section 360bbb-3(b)(1), unless the authorization is  terminated or revoked.    Respiratory Syncytial Virus by PCR NEGATIVE NEGATIVE Final    Comment: (NOTE) Fact Sheet for Patients: PinkCheek.be  Fact Sheet for Healthcare Providers: GravelBags.it  This test is not yet approved or cleared by the [place] FDA and  has been authorized for detection and/or diagnosis of SARS-CoV-2 by  FDA under an Emergency Use Authorization (EUA). This EUA will remain  in effect (meaning this test can be used) for the duration of the  COVID-19 declaration under Section 564(b)(1) of the Act, 21 U.S.C.  section 360bbb-3(b)(1), unless the authorization is terminated or  revoked. Performed at [facility], [place] [address] [place], [place] [address]          Radiology Studies: CT HEAD WO CONTRAST  Result Date: 06/18/2020 CLINICAL DATA:  74 year old female with no history of seizures but presentation of diaphoresis, agitated, nonverbal, postictal. EXAM: CT HEAD WITHOUT CONTRAST TECHNIQUE: Contiguous axial images were obtained from the base of the skull through the vertex without intravenous contrast. COMPARISON:  Head CT 02/23/2020. FINDINGS: Brain: Cerebral volume is stable and within normal limits for age. No midline shift, ventriculomegaly, mass effect, evidence of mass lesion, intracranial hemorrhage or evidence of cortically based acute infarction. Stable hypodensity in the left caudate head. Stable and elsewhere generally normal for age gray-white matter differentiation. Minor bilateral white matter hypodensity. No cortical encephalomalacia identified. Vascular: Calcified  atherosclerosis at the skull base. No suspicious intracranial vascular hyperdensity. Skull: No acute osseous abnormality identified. Sinuses/Orbits: Visualized paranasal sinuses and mastoids are stable and well pneumatized. Other: No acute orbit or scalp soft tissue finding. Calcified scalp vessel atherosclerosis. IMPRESSION: 1. No acute intracranial abnormality. Mild for age small-vessel disease suspected and stable since June. 2. Calcified scalp vessel atherosclerosis, typically associated with chronic renal disease. Electronically Signed   By: Genevie Ann M.D.   On: 06/18/2020 13:12   DG Chest Port 1 View  Result Date: 06/18/2020 CLINICAL DATA:  74 year old female with seizures, altered mental status. EXAM: PORTABLE CHEST 1 VIEW COMPARISON:  Chest radiographs 11/14/2017 and earlier. CT Abdomen and Pelvis 03/29/2020. FINDINGS: Portable AP semi upright view at 1314 hours. Chronic cardiomegaly. Calcified long segment left ventricular myocardial infarct, which was not apparent radiographically in 2019. See CT appearance in July this year. Stable left chest AICD. Visualized tracheal air column is within normal limits. No pneumothorax. No pleural effusion identified. Increased lung base pulmonary vascular congestion. No consolidation. Chronic benign chondroid lesion of the proximal left humerus. No acute osseous abnormality identified. Paucity of bowel gas in the upper abdomen. IMPRESSION: 1. Cardiomegaly with prominent post-infarction left ventricular mural calcification. 2. Pulmonary vascular congestion at the lung bases. Consider mild or developing interstitial edema. Electronically Signed   By: Genevie Ann M.D.   On: 06/18/2020 13:36        Scheduled Meds: . amiodarone  100 mg Oral Daily  . aspirin EC  81 mg Oral Daily  . atorvastatin  80 mg Oral QHS  . carvedilol  6.25 mg Oral BID WC  . clopidogrel  75 mg Oral Daily  . furosemide  40 mg Intravenous BID  . heparin  5,000 Units Subcutaneous Q8H  . insulin  aspart  0-9 Units Subcutaneous TID WC  . insulin glargine  10 Units Subcutaneous Daily  . levothyroxine  100 mcg Oral QAC breakfast  . potassium chloride  40 mEq Oral BID  . ranolazine  500 mg Oral Daily  . sacubitril-valsartan  1 tablet Oral BID  . senna  1 tablet Oral BID  . sertraline  50 mg Oral Daily  . spironolactone  12.5 mg Oral QODAY  . zolpidem  10 mg Oral QHS   Continuous Infusions: . levETIRAcetam Stopped (06/19/20 0241)     LOS: 1 day    Time spent: 25 minutes    Edwin Dada, MD [facility] 06/19/2020, 10:31 AM     Please page though [place] or Epic secure chat:  For [employer], Adult nurse

## 2020-06-19 NOTE — Procedures (Signed)
Patient Name: SHAMECKA HOCUTT  MRN: [id number]  Epilepsy Attending: Lora Havens  Referring Physician/Provider: Dr. Adella Hare Date: 06/19/1020 Duration: 23.51 mins  Patient history: 74 year old female with new onset seizures.  EEG evaluate for seizures.  Level of alertness: Awake  AEDs during EEG study: None  Technical aspects: This EEG study was done with scalp electrodes positioned according to the 10-20 International system of electrode placement. Electrical activity was acquired at a sampling rate of 500Hz  and reviewed with a high frequency filter of 70Hz  and a low frequency filter of 1Hz . EEG data were recorded continuously and digitally stored.   Description: No clear posterior dominant rhythm was seen.  EEG showed continuous generalized polymorphic mixed frequencies with predominantly 3 to 6 Hz theta-delta slowing, maximal bitemporal region as well as 13 to 15 Hz beta activity in frontocentral region. Hyperventilation and photic stimulation were not performed.     ABNORMALITY -Continuous slow, generalized  IMPRESSION: This study is suggestive of moderate diffuse encephalopathy, nonspecific etiology. No seizures or epileptiform discharges were seen throughout the recording.  Maxyne Derocher Barbra Sarks

## 2020-06-19 NOTE — Progress Notes (Signed)
PT Cancellation Note  Patient Details Name: ITZA MANIACI MRN: [id number] DOB: 1946-04-09   Cancelled Treatment:    Reason Eval/Treat Not Completed: Patient at procedure or test/unavailable Pt currently getting EEG. Will follow up as schedule allows.   Lou Miner, DPT  Acute Rehabilitation Services  Pager: [phone] Office: [phone]    Rudean Hitt 06/19/2020, 12:35 PM

## 2020-06-20 LAB — GLUCOSE, CAPILLARY
Glucose-Capillary: 141 mg/dL — ABNORMAL HIGH (ref 70–99)
Glucose-Capillary: 140 mg/dL — ABNORMAL HIGH (ref 70–99)

## 2020-06-20 LAB — BASIC METABOLIC PANEL
Anion gap: 9 (ref 5–15)
BUN: 16 mg/dL (ref 8–23)
CO2: 28 mmol/L (ref 22–32)
Calcium: 8.6 mg/dL — ABNORMAL LOW (ref 8.9–10.3)
Chloride: 102 mmol/L (ref 98–111)
Creatinine, Ser: 1.73 mg/dL — ABNORMAL HIGH (ref 0.44–1.00)
GFR calc Af Amer: 33 mL/min — ABNORMAL LOW (ref >60)
GFR calc non Af Amer: 29 mL/min — ABNORMAL LOW (ref >60)
Glucose, Bld: 133 mg/dL — ABNORMAL HIGH (ref 70–99)
Potassium: 4.5 mmol/L (ref 3.5–5.1)
Sodium: 139 mmol/L (ref 135–145)

## 2020-06-20 LAB — GLIADIN ANTIBODIES, SERUM
Antigliadin Abs, IgA: 3 units (ref 0–19)
Gliadin IgG: 1 units (ref 0–19)

## 2020-06-20 LAB — TISSUE TRANSGLUTAMINASE, IGA: Tissue Transglutaminase Ab, IgA: <2 U/mL (ref 0–3)

## 2020-06-20 MED ORDER — LORAZEPAM 2 MG/ML IJ SOLN
1.0000 mg | Freq: Once | INTRAMUSCULAR | Status: AC
  Administered 2020-06-20: 1 mg via INTRAVENOUS

## 2020-06-20 MED ORDER — LORAZEPAM 2 MG/ML IJ SOLN
INTRAMUSCULAR | Status: AC
  Filled 2020-06-20: qty 1

## 2020-06-20 MED ORDER — LORAZEPAM 2 MG/ML IJ SOLN
1.0000 mg | Freq: Once | INTRAMUSCULAR | Status: AC
  Filled 2020-06-20: qty 1

## 2020-06-20 NOTE — Progress Notes (Signed)
°   06/20/20 0419  Provider Notification  Provider Name/Title Dr Tonie Griffith  Date Provider Notified 06/20/20  Time Provider Notified [phone]  Notification Type Page  Notification Reason Other (Comment) (Pt still agitated, now combative despite repeat iv ativan)  Response See new orders  Date of Provider Response 06/20/20  Time of Provider Response [phone]

## 2020-06-20 NOTE — TOC Transition Note (Signed)
Transition of Care [facility]) - CM/SW Discharge Note   Patient Details  Name: Jenna West MRN: [id number] Date of Birth: 02-04-1946  [facility] CM/SW Contact:  Pollie Friar, RN Phone Number: 06/20/2020, 3:46 PM   Clinical Narrative:    Pt discharging home with [facility] services through [facility]. Amy with [facility] accepted the referral.  Ordered 3 in 1 will be delivered to the room per [employer].  Sister stating that she and pts niece will be helping the spouse out some at home with pt. CM also provided her information on [facility] Sister to provide transport home.   Final next level of care: Home w Home Health Services Barriers to Discharge: No Barriers Identified   Patient Goals and CMS Choice   CMS Medicare.gov Compare Post Acute Care list provided to:: Patient Represenative (must comment) Choice offered to / list presented to : Sibling  Discharge Placement                       Discharge Plan and Services                DME Arranged: 3-N-1 DME Agency: [employer] Date DME Agency Contacted: 06/20/20   Representative spoke with at DME Agency: Peggye Form metal HH Arranged: PT, OT, Social Work [employer] Agency: [facility] [place] Date [place]: 06/20/20   Representative spoke with at [place]: Amy  Social Determinants of Health ([place]) Interventions     Readmission Risk Interventions No flowsheet data found.

## 2020-06-20 NOTE — Discharge Summary (Signed)
Physician Discharge Summary  MACKINSEY PELLAND WCH:[id number] DOB: 1945/12/08 DOA: 06/18/2020  PCP: Ronita Hipps, MD  Admit date: 06/18/2020 Discharge date: 06/20/2020  Admitted From: Home  Disposition:  Home with [facility]   Recommendations for Outpatient Follow-up:  1. Follow up with Neurology for new seizure in 8 weeks 2. Follow up with GI for colonoscopy as previously arranged        Home Health: PT/OT due to ongoing imbalance  Equipment/Devices: None  Discharge Condition: Fair  CODE STATUS: FULL Diet recommendation: Cardiac  Brief/Interim Summary: Jenna West is a 74 y.o. 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  No prior history of seizures, but was with her sister on day of admission when she had LOC and generalized shaking, was post-ictal after.    In the ER, CXR showed edema.  MRI brain could not be performed due to ICD.  She was given fluids, loaded with Keppra and admitted for EEG.      PRINCIPAL HOSPITAL DIAGNOSIS: Seizure in setting of acute systolic CHF    Discharge Diagnoses:   New onset seizure MRI brain normal.  EEG unremarkable.    Patient evaluated by epileptologist, in setting of first unprovoked seizure, driving precautions given, but no AEDs started.  Recommend Neurology follow up in 8 weeks.    Acute on chronic systolic CHF On admission, BNP elevated, edema on CXR, cardiomegaly on CXR and hypoxic.  Patient had recently been noted by EP to have increased Optivol.  Thought to be from inconsistent furosemide use.  Here, patient given IV diuretic.  I/Os not recordeed. After diuresis, she ambulated and had no dyspnea.    Discharged with close Cardiology follow up next Wednesday  Acute delirium Patient noted overnight to have confusion and disorientation.  Given Ativan and subsequently was sleepy and forgetful.  No suspicion for infection, stroke, other  metabolic cause than Ativan.  Ischemic cardiomyopathy c/b VT with ICD Hypothyroidism CKD IIIb Hypokalemia Diabetes   Diarrhea and weight loss Anti-gliadin and tissue transglutaminase Abs ordered, no IgA ordered.  Results inconclusive.  Patient will follow up with Dr. Paulita Fujita.  Depression Patient's daughter deceased >12 weeks ago, tragically. Her adjustment reaction at this point seems extended.  Encouraged to seek treatment for possible depression.        Discharge Instructions  Discharge Instructions    Ambulatory referral to Neurology   Complete by: As directed    An appointment is requested in approximately: 8 weeks   Diet - low sodium heart healthy   Complete by: As directed    Discharge instructions   Complete by: As directed    From Dr. Loleta Books: You were admitted for a seizure.   You were evaluated here with imaging of your brain which was normal and showed (thankfully) no mass or other dangerous cause of a seizure.  You had an EEG here, which was normal.  Sometimes this happens.  But in your case, we hope that it does not happen again. Go see a Neurologist at [facility] in 2 months I have sent this referral, the info for contacting them is below  Resume your normal home medicines (Wait 2 days to restart the Entresto, restart this on Thursday morning)  Make sure to take your Furosemide and spironolactone daily Go to your appointment with Oda Kilts at Dr. Macky Lower office on Oct 13  Follow up with Dr. Paulita Fujita for  the EGD  Inquire about the [facility] program in [place]   Take care of yourself.   Increase activity slowly   Complete by: As directed      Allergies as of 06/20/2020      Reactions   Shellfish Allergy Swelling   Of mouth only   Sulfa Antibiotics Other (See Comments)   Internal bleeding      Medication List    TAKE these medications   Accu-Chek Aviva Plus w/Device Kit Apply 1 Dose topically daily.   amiodarone 200 MG  tablet Commonly known as: PACERONE TAKE ONE-HALF TABLET BY  MOUTH ON MONDAY, WEDNESDAY, AND FRIDAY AS DIRECTED What changed: See the new instructions.   aspirin EC 81 MG tablet Take 81 mg by mouth daily.   atorvastatin 80 MG tablet Commonly known as: LIPITOR TAKE 1 TABLET BY MOUTH  DAILY What changed: when to take this   carvedilol 6.25 MG tablet Commonly known as: COREG TAKE 1 TABLET BY MOUTH  TWICE DAILY What changed: when to take this   clopidogrel 75 MG tablet Commonly known as: PLAVIX TAKE 1 TABLET BY MOUTH  DAILY   Entresto 24-26 MG Generic drug: sacubitril-valsartan Take 1 tablet by mouth 2 (two) times daily.   furosemide 20 MG tablet Commonly known as: LASIX TAKE 1 TABLET BY MOUTH  DAILY What changed:   how much to take  how to take this  when to take this  additional instructions   HumaLOG 100 UNIT/ML injection Generic drug: insulin lispro Inject 12 Units into the skin daily.   insulin glargine 100 UNIT/ML injection Commonly known as: LANTUS Inject 10 Units into the skin daily.   levothyroxine 100 MCG tablet Commonly known as: SYNTHROID Take 100 mcg by mouth daily before breakfast.   nitroGLYCERIN 0.4 MG/SPRAY spray Commonly known as: Nitrolingual Place 1 spray under the tongue every 5 (five) minutes x 3 doses as needed for chest pain.   ranolazine 500 MG 12 hr tablet Commonly known as: RANEXA Take 500 mg by mouth daily.   sertraline 50 MG tablet Commonly known as: ZOLOFT Take 50 mg by mouth daily.   spironolactone 25 MG tablet Commonly known as: ALDACTONE TAKE ONE-HALF TABLET BY  MOUTH DAILY What changed: when to take this   Vitamin D (Ergocalciferol) 1.25 MG (50000 UNIT) Caps capsule Commonly known as: DRISDOL Take 50,000 Units by mouth every Wednesday.   zolpidem 10 MG tablet Commonly known as: AMBIEN Take 10 mg by mouth at bedtime.            Durable Medical Equipment  (From admission, onward)         Start     Ordered    06/20/20 1545  For home use only DME 3 n 1  Once        06/20/20 1544          Follow-up Information    Shirley Friar, PA-C Follow up on 06/28/2020.   Specialty: Physician Assistant Why: at 1005 for post hospital follow up Contact information: [place] [address] [address], [facility] Follow up.   Specialty: [place] Why: The home health agency will contact you for the first home visit Contact information: [place] [address] [phone]              Allergies  Allergen Reactions  . Shellfish Allergy Swelling    Of mouth only  .  Sulfa Antibiotics Other (See Comments)    Internal bleeding    Consultations:  Neurology   Procedures/Studies: CT HEAD WO CONTRAST  Result Date: 06/19/2020 CLINICAL DATA:  Seizure EXAM: CT HEAD WITHOUT CONTRAST TECHNIQUE: Contiguous axial images were obtained from the base of the skull through the vertex without intravenous contrast. COMPARISON:  June 18, 2020 FINDINGS: Brain: Mild diffuse atrophy is stable. There is no intracranial mass, hemorrhage, extra-axial fluid collection, or midline shift. There is small vessel disease in the centra semiovale bilaterally, stable. There is evidence of a prior small infarct in the left caudate nucleus head. No acute appearing infarct evident. Vascular: No hyperdense vessel. Calcification is noted in the carotid siphon region bilaterally. Skull: Bony calvarium appears intact. Sinuses/Orbits: Mucosal thickening noted in several ethmoid air cells. Other paranasal sinuses are clear. Orbits appear symmetric bilaterally except for apparent cataract removal on the right. Other: Mastoid air cells are clear. IMPRESSION: Mild atrophy with periventricular small vessel disease, stable. Prior small infarct in the head of the caudate nucleus on the left. No acute infarct. No mass or hemorrhage. There are foci of arterial  vascular calcification. There is mucosal thickening in several ethmoid air cells. Electronically Signed   By: Lowella Grip III M.D.   On: 06/19/2020 14:52   CT HEAD WO CONTRAST  Result Date: 06/18/2020 CLINICAL DATA:  74 year old female with no history of seizures but presentation of diaphoresis, agitated, nonverbal, postictal. EXAM: CT HEAD WITHOUT CONTRAST TECHNIQUE: Contiguous axial images were obtained from the base of the skull through the vertex without intravenous contrast. COMPARISON:  Head CT 02/23/2020. FINDINGS: Brain: Cerebral volume is stable and within normal limits for age. No midline shift, ventriculomegaly, mass effect, evidence of mass lesion, intracranial hemorrhage or evidence of cortically based acute infarction. Stable hypodensity in the left caudate head. Stable and elsewhere generally normal for age gray-white matter differentiation. Minor bilateral white matter hypodensity. No cortical encephalomalacia identified. Vascular: Calcified atherosclerosis at the skull base. No suspicious intracranial vascular hyperdensity. Skull: No acute osseous abnormality identified. Sinuses/Orbits: Visualized paranasal sinuses and mastoids are stable and well pneumatized. Other: No acute orbit or scalp soft tissue finding. Calcified scalp vessel atherosclerosis. IMPRESSION: 1. No acute intracranial abnormality. Mild for age small-vessel disease suspected and stable since June. 2. Calcified scalp vessel atherosclerosis, typically associated with chronic renal disease. Electronically Signed   By: Genevie Ann M.D.   On: 06/18/2020 13:12   DG Chest Port 1 View  Result Date: 06/18/2020 CLINICAL DATA:  74 year old female with seizures, altered mental status. EXAM: PORTABLE CHEST 1 VIEW COMPARISON:  Chest radiographs 11/14/2017 and earlier. CT Abdomen and Pelvis 03/29/2020. FINDINGS: Portable AP semi upright view at 1314 hours. Chronic cardiomegaly. 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 See CT appearance in July this year. Stable left chest AICD. Visualized tracheal air column is within normal limits. No pneumothorax. No pleural effusion identified. Increased lung base pulmonary vascular congestion. No consolidation. Chronic benign chondroid lesion of the proximal left humerus. No acute osseous abnormality identified. Paucity of bowel gas in the upper abdomen. IMPRESSION: 1. Cardiomegaly with prominent post-infarction left ventricular mural calcification. 2. Pulmonary vascular congestion at the lung bases. Consider mild or developing interstitial edema. Electronically Signed   By: Genevie Ann M.D.   On: 06/18/2020 13:36   EEG adult  Result Date: 06/19/2020 Lora Havens, MD     06/19/2020 12:42 PM Patient Name: Jenna West MRN: [id number] Epilepsy Attending: Marcelle Overlie  Barbra Sarks Referring Physician/Provider: Dr. Adella Hare Date: 06/19/1020 Duration: 23.51 mins Patient history: 74 year old female with new onset seizures.  EEG evaluate for seizures. Level of alertness: Awake AEDs during EEG study: None Technical aspects: This EEG study was done with scalp electrodes positioned according to the 10-20 International system of electrode placement. Electrical activity was acquired at a sampling rate of '500Hz'  and reviewed with a high frequency filter of '70Hz'  and a low frequency filter of '1Hz' . EEG data were recorded continuously and digitally stored. Description: No clear posterior dominant rhythm was seen.  EEG showed continuous generalized polymorphic mixed frequencies with predominantly 3 to 6 Hz theta-delta slowing, maximal bitemporal region as well as 13 to 15 Hz beta activity in frontocentral region. Hyperventilation and photic stimulation were not performed.   ABNORMALITY -Continuous slow, generalized IMPRESSION: This study is suggestive of moderate diffuse encephalopathy, nonspecific etiology. No seizures or epileptiform discharges were seen  throughout the recording. Lora Havens   CUP PACEART REMOTE DEVICE CHECK  Result Date: 06/12/2020 Scheduled remote reviewed. Normal device function.  17 treated VT episodes, rates 150-180bpm treated successfully with ATP 1 burst except most recent on 06/05/20 requiring 3 sequences. 42 NSVT. No shocks. Routing for further review. On Amiodarone. Next remote 91 days- [employer], RN/CVRS      Subjective: Feeling well.  No dyspnea, no orthopnea, no swelling.  Still a little sleepy and confused from Ativan.    Discharge Exam: Vitals:   06/20/20 0738 06/20/20 1207  BP: 114/63 108/63  Pulse: 70 70  Resp: 18 18  Temp: (!) 97.4 F (36.3 C) 98.2 F (36.8 C)  SpO2: 97% 100%   Vitals:   06/20/20 0258 06/20/20 0500 06/20/20 0738 06/20/20 1207  BP: 115/63  114/63 108/63  Pulse: (!) 59  70 70  Resp: '18  18 18  ' Temp: (!) 97.5 F (36.4 C)  (!) 97.4 F (36.3 C) 98.2 F (36.8 C)  TempSrc: Oral  Axillary Oral  SpO2: 97%  97% 100%  Weight:  66.5 kg    Height:        General: Pt is alert, awake, not in acute distress, but somewhat inappropriate Cardiovascular: RRR, nl S1-S2, no murmurs appreciated.   No LE edema.   Respiratory: Normal respiratory rate and rhythm.  CTAB without rales or wheezes. Abdominal: Abdomen soft and non-tender.  No distension or HSM.   Neuro/Psych: Strength symmetric in upper and lower extremities.  Judgment and insight appear moderately impaired.   The results of significant diagnostics from this hospitalization (including imaging, microbiology, ancillary and laboratory) are listed below for reference.     Microbiology: Recent Results (from the past 240 hour(s))  Resp Panel by RT PCR (RSV, Flu A&B, Covid) - Nasopharyngeal Swab     Status: None   Collection Time: 06/18/20  1:18 PM   Specimen: Nasopharyngeal Swab  Result Value Ref Range Status   SARS Coronavirus 2 by RT PCR NEGATIVE NEGATIVE Final    Comment: (NOTE) SARS-CoV-2 target nucleic acids are NOT  DETECTED.  The SARS-CoV-2 RNA is generally detectable in upper respiratoy specimens during the acute phase of infection. The lowest concentration of SARS-CoV-2 viral copies this assay can detect is 131 copies/mL. A negative result does not preclude SARS-Cov-2 infection and should not be used as the sole basis for treatment or other patient management decisions. A negative result may occur with  improper specimen collection/handling, submission of specimen other than nasopharyngeal swab, presence of viral mutation(s) within the areas targeted by  this assay, and inadequate number of viral copies (<131 copies/mL). A negative result must be combined with clinical observations, patient history, and epidemiological information. The expected result is Negative.  Fact Sheet for Patients:  PinkCheek.be  Fact Sheet for Healthcare Providers:  GravelBags.it  This test is no t yet approved or cleared by the [place] FDA and  has been authorized for detection and/or diagnosis of SARS-CoV-2 by FDA under an Emergency Use Authorization (EUA). This EUA will remain  in effect (meaning this test can be used) for the duration of the COVID-19 declaration under Section 564(b)(1) of the Act, 21 U.S.C. section 360bbb-3(b)(1), unless the authorization is terminated or revoked sooner.     Influenza A by PCR NEGATIVE NEGATIVE Final   Influenza B by PCR NEGATIVE NEGATIVE Final    Comment: (NOTE) The Xpert Xpress SARS-CoV-2/FLU/RSV assay is intended as an aid in  the diagnosis of influenza from Nasopharyngeal swab specimens and  should not be used as a sole basis for treatment. Nasal washings and  aspirates are unacceptable for Xpert Xpress SARS-CoV-2/FLU/RSV  testing.  Fact Sheet for Patients: PinkCheek.be  Fact Sheet for Healthcare Providers: GravelBags.it  This test is not yet  approved or cleared by the [place] FDA and  has been authorized for detection and/or diagnosis of SARS-CoV-2 by  FDA under an Emergency Use Authorization (EUA). This EUA will remain  in effect (meaning this test can be used) for the duration of the  Covid-19 declaration under Section 564(b)(1) of the Act, 21  U.S.C. section 360bbb-3(b)(1), unless the authorization is  terminated or revoked.    Respiratory Syncytial Virus by PCR NEGATIVE NEGATIVE Final    Comment: (NOTE) Fact Sheet for Patients: PinkCheek.be  Fact Sheet for Healthcare Providers: GravelBags.it  This test is not yet approved or cleared by the [place] FDA and  has been authorized for detection and/or diagnosis of SARS-CoV-2 by  FDA under an Emergency Use Authorization (EUA). This EUA will remain  in effect (meaning this test can be used) for the duration of the  COVID-19 declaration under Section 564(b)(1) of the Act, 21 U.S.C.  section 360bbb-3(b)(1), unless the authorization is terminated or  revoked. Performed at [facility], [place] [address], [place], [place] [address]      Labs: BNP (last 3 results) Recent Labs    06/18/20 1718  BNP 798.9*   Basic Metabolic Panel: Recent Labs  Lab 06/18/20 1240 06/18/20 1252 06/20/20 0202  NA 138 136 139  K 3.2* 3.1* 4.5  CL 98 98 102  CO2 23  --  28  GLUCOSE 76 74 133*  BUN '18 21 16  ' CREATININE 1.75* 1.60* 1.73*  CALCIUM 8.7*  --  8.6*   Liver Function Tests: Recent Labs  Lab 06/18/20 1240  AST 27  ALT 21  ALKPHOS 78  BILITOT 1.6*  PROT 6.6  ALBUMIN 3.3*   No results for input(s): LIPASE, AMYLASE in the last 168 hours. No results for input(s): AMMONIA in the last 168 hours. CBC: Recent Labs  Lab 06/18/20 1240 06/18/20 1252 06/20/20 0202  WBC 5.1  --  3.0*  NEUTROABS 3.0  --   --   HGB 14.4 16.0* 11.3*  HCT 46.5* 47.0* 37.6  MCV 91.0  --  94.2  PLT 171  --  88*    Cardiac Enzymes: No results for input(s): CKTOTAL, CKMB, CKMBINDEX, TROPONINI in the last 168 hours. BNP: Invalid input(s): POCBNP CBG: Recent Labs  Lab 06/19/20 1200  06/19/20 1543 06/19/20 2051 06/20/20 0605 06/20/20 1205  GLUCAP 159* 173* 157* 141* 140*   D-Dimer No results for input(s): DDIMER in the last 72 hours. Hgb A1c Recent Labs    06/18/20 1718  HGBA1C 6.6*   Lipid Profile No results for input(s): CHOL, HDL, LDLCALC, TRIG, CHOLHDL, LDLDIRECT in the last 72 hours. Thyroid function studies Recent Labs    06/18/20 2230  TSH 3.705   Anemia work up No results for input(s): VITAMINB12, FOLATE, FERRITIN, TIBC, IRON, RETICCTPCT in the last 72 hours. Urinalysis    Component Value Date/Time   COLORURINE YELLOW 06/18/2020 1919   APPEARANCEUR CLEAR 06/18/2020 1919   LABSPEC 1.011 06/18/2020 1919   PHURINE 6.0 06/18/2020 1919   GLUCOSEU NEGATIVE 06/18/2020 1919   HGBUR SMALL (A) 06/18/2020 [place] NEGATIVE 06/18/2020 [place] NEGATIVE 06/18/2020 1919   PROTEINUR 30 (A) 06/18/2020 1919   NITRITE NEGATIVE 06/18/2020 1919   LEUKOCYTESUR TRACE (A) 06/18/2020 1919   Sepsis Labs Invalid input(s): PROCALCITONIN,  WBC,  LACTICIDVEN Microbiology Recent Results (from the past 240 hour(s))  Resp Panel by RT PCR (RSV, Flu A&B, Covid) - Nasopharyngeal Swab     Status: None   Collection Time: 06/18/20  1:18 PM   Specimen: Nasopharyngeal Swab  Result Value Ref Range Status   SARS Coronavirus 2 by RT PCR NEGATIVE NEGATIVE Final    Comment: (NOTE) SARS-CoV-2 target nucleic acids are NOT DETECTED.  The SARS-CoV-2 RNA is generally detectable in upper respiratoy specimens during the acute phase of infection. The lowest concentration of SARS-CoV-2 viral copies this assay can detect is 131 copies/mL. A negative result does not preclude SARS-Cov-2 infection and should not be used as the sole basis for treatment or other patient management decisions. A  negative result may occur with  improper specimen collection/handling, submission of specimen other than nasopharyngeal swab, presence of viral mutation(s) within the areas targeted by this assay, and inadequate number of viral copies (<131 copies/mL). A negative result must be combined with clinical observations, patient history, and epidemiological information. The expected result is Negative.  Fact Sheet for Patients:  PinkCheek.be  Fact Sheet for Healthcare Providers:  GravelBags.it  This test is no t yet approved or cleared by the [place] FDA and  has been authorized for detection and/or diagnosis of SARS-CoV-2 by FDA under an Emergency Use Authorization (EUA). This EUA will remain  in effect (meaning this test can be used) for the duration of the COVID-19 declaration under Section 564(b)(1) of the Act, 21 U.S.C. section 360bbb-3(b)(1), unless the authorization is terminated or revoked sooner.     Influenza A by PCR NEGATIVE NEGATIVE Final   Influenza B by PCR NEGATIVE NEGATIVE Final    Comment: (NOTE) The Xpert Xpress SARS-CoV-2/FLU/RSV assay is intended as an aid in  the diagnosis of influenza from Nasopharyngeal swab specimens and  should not be used as a sole basis for treatment. Nasal washings and  aspirates are unacceptable for Xpert Xpress SARS-CoV-2/FLU/RSV  testing.  Fact Sheet for Patients: PinkCheek.be  Fact Sheet for Healthcare Providers: GravelBags.it  This test is not yet approved or cleared by the [place] FDA and  has been authorized for detection and/or diagnosis of SARS-CoV-2 by  FDA under an Emergency Use Authorization (EUA). This EUA will remain  in effect (meaning this test can be used) for the duration of the  Covid-19 declaration under Section 564(b)(1) of the Act, 21  U.S.C. section 360bbb-3(b)(1), unless the authorization  is  terminated or revoked.    Respiratory Syncytial Virus by PCR NEGATIVE NEGATIVE Final    Comment: (NOTE) Fact Sheet for Patients: PinkCheek.be  Fact Sheet for Healthcare Providers: GravelBags.it  This test is not yet approved or cleared by the [place] FDA and  has been authorized for detection and/or diagnosis of SARS-CoV-2 by  FDA under an Emergency Use Authorization (EUA). This EUA will remain  in effect (meaning this test can be used) for the duration of the  COVID-19 declaration under Section 564(b)(1) of the Act, 21 U.S.C.  section 360bbb-3(b)(1), unless the authorization is terminated or  revoked. Performed at [facility], [place] [address] [place], [place] [address]      Time coordinating discharge: 35 minutes       SIGNED:   Edwin Dada, MD  [facility] 06/20/2020, 8:22 PM

## 2020-06-20 NOTE — Evaluation (Signed)
Occupational Therapy Evaluation Patient Details Name: Jenna West MRN: [id number] DOB: [birth year] Today's Date: 06/20/2020    History of Present Illness Pt is a 74 y/o female admitted secondary to seizure like activity. Pt is s/p EEG that was suggestive of moderate diffuse encephalopathy. PMH includes CAD, DM, CHF, s/p ICD.    Clinical Impression   PT admitted with encephalopathy. Pt currently with functional limitiations due to the deficits listed below (see OT problem list). Pt high fall risk for d/ chome and recommendation for 24/7 for all transfers.  Pt will benefit from skilled OT to increase their independence and safety with adls and balance to allow discharge HHOT and 3n1.     Follow Up Recommendations  Home health OT    Equipment Recommendations  3 in 1 bedside commode    Recommendations for Other Services       Precautions / Restrictions Precautions Precautions: Fall      Mobility Bed Mobility Overal bed mobility: Needs Assistance Bed Mobility: Supine to Sit;Sit to Supine     Supine to sit: Mod assist Sit to supine: Min assist      Transfers Overall transfer level: Needs assistance   Transfers: Sit to/from Stand Sit to Stand: Mod assist         General transfer comment: pt with lean onto therapist and relying on therapist for support. due to cogntiive deficits not recommended for RW at this time    Balance Overall balance assessment: Needs assistance Sitting-balance support: Bilateral upper extremity supported Sitting balance-Leahy Scale: Fair     Standing balance support: Bilateral upper extremity supported;During functional activity Standing balance-Leahy Scale: Poor                             ADL either performed or assessed with clinical judgement   ADL Overall ADL's : Needs assistance/impaired     Grooming: Moderate assistance           Upper Body Dressing : Moderate assistance   Lower Body Dressing: Maximal  assistance   Toilet Transfer: Moderate assistance   Toileting- Clothing Manipulation and Hygiene: Moderate assistance       Functional mobility during ADLs: Moderate assistance General ADL Comments: pt with unsteady balance for all adls. sister educated pt will need physical (A) for all transfers and 24/7 supervision. recommending baby monitor for visual on patient at all times. pt will fall if transfering alone     Vision         Perception     Praxis      Pertinent Vitals/Pain Pain Assessment: No/denies pain     Hand Dominance Right   Extremity/Trunk Assessment Upper Extremity Assessment Upper Extremity Assessment: Generalized weakness   Lower Extremity Assessment Lower Extremity Assessment: Generalized weakness   Cervical / Trunk Assessment Cervical / Trunk Assessment: Normal   Communication Communication Communication: No difficulties   Cognition Arousal/Alertness: Awake/alert Behavior During Therapy: Impulsive Overall Cognitive Status: Impaired/Different from baseline Area of Impairment: Orientation;Attention;Memory;Safety/judgement;Awareness                 Orientation Level: Disoriented to;Place;Time;Situation Current Attention Level: Sustained Memory: Decreased recall of precautions;Decreased short-term memory   Safety/Judgement: Decreased awareness of safety;Decreased awareness of deficits Awareness: Intellectual   General Comments: pt easily agitated and reports being "tired' pt reports "she isnt suppose to be here being bossy" pointing at sister. Pt calling staff in the call "b++ch" and when stated why are you calling  her names? pt states "because she is i met her yesterday" pt states "is that mother out there" pt unaware mother has passed   General Comments       Exercises     Shoulder Instructions      Home Living Family/patient expects to be discharged to:: Private residence Living Arrangements: Spouse/significant other Available  Help at Discharge: Family;Available 24 hours/day Type of Home: House Home Access: Stairs to enter [employer] of Steps: 1 Entrance Stairs-Rails: None Home Layout: Two level;Able to live on main level with bedroom/bathroom     Bathroom Shower/Tub: Teacher, early years/pre: Standard     Home Equipment: Cane - single point;Grab bars - toilet;Grab bars - tub/shower;Hand held shower head   Additional Comments: spouse name Rush Landmark has a beagle named bob ( 69 yo) and sister POA prsent named Alyse Low with a "c"      Prior Functioning/Environment Level of Independence: Needs assistance  Gait / Transfers Assistance Needed: Pt independent with ambulation ADL's / Homemaking Assistance Needed: Husband provides supervision as she has fallen out of the tub            OT Problem List: Decreased strength;Impaired balance (sitting and/or standing);Decreased activity tolerance;Decreased cognition;Decreased safety awareness;Decreased knowledge of use of DME or AE;Cardiopulmonary status limiting activity      OT Treatment/Interventions: Self-care/ADL training;Therapeutic exercise;Neuromuscular education;Energy conservation;DME and/or AE instruction;Therapeutic activities;Cognitive remediation/compensation;Balance training;Patient/family education    OT Goals(Current goals can be found in the care plan section) Acute Rehab OT Goals Patient Stated Goal: to go home OT Goal Formulation: With patient/family Time For Goal Achievement: 07/04/20 Potential to Achieve Goals: Good  OT Frequency: Min 2X/week   Barriers to D/C: Decreased caregiver support          Co-evaluation              AM-PAC OT "6 Clicks" Daily Activity     Outcome Measure Help from another person eating meals?: A Little Help from another person taking care of personal grooming?: A Little Help from another person toileting, which includes using toliet, bedpan, or urinal?: A Lot Help from another person  bathing (including washing, rinsing, drying)?: A Lot Help from another person to put on and taking off regular upper body clothing?: A Lot Help from another person to put on and taking off regular lower body clothing?: A Lot 6 Click Score: 14   End of Session Equipment Utilized During Treatment: Gait belt Nurse Communication: Mobility status;Precautions  Activity Tolerance: Patient tolerated treatment well Patient left: in bed;with call bell/phone within reach;with bed alarm set;with family/visitor present  OT Visit Diagnosis: Unsteadiness on feet (R26.81);Muscle weakness (generalized) (M62.81)                Time: 3212-2482 OT Time Calculation (min): 38 min Charges:  OT General Charges $OT Visit: 1 Visit OT Evaluation $OT Eval Moderate Complexity: 1 Mod OT Treatments $Self Care/Home Management : 8-22 mins   Brynn, OTR/L  Acute Rehabilitation Services Pager: [phone] Office: [phone] .   Jeri Modena 06/20/2020, 4:33 PM

## 2020-06-21 LAB — CBC
HCT: 37.6 % (ref 36.0–46.0)
Hemoglobin: 11.3 g/dL — ABNORMAL LOW (ref 12.0–15.0)
MCH: 28.3 pg (ref 26.0–34.0)
MCHC: 30.1 g/dL (ref 30.0–36.0)
MCV: 94.2 fL (ref 80.0–100.0)
Platelets: 88 10*3/uL — ABNORMAL LOW (ref 150–400)
RBC: 3.99 MIL/uL (ref 3.87–5.11)
RDW: 15.9 % — ABNORMAL HIGH (ref 11.5–15.5)
WBC: 3 10*3/uL — ABNORMAL LOW (ref 4.0–10.5)
nRBC: 0 % (ref 0.0–0.2)

## 2020-06-21 LAB — RETICULIN ANTIBODIES, IGA W TITER: Reticulin Ab, IgA: NEGATIVE titer (ref <2.5)

## 2020-06-22 ENCOUNTER — Telehealth: Payer: Self-pay | Admitting: Cardiology

## 2020-06-22 DIAGNOSIS — Z7902 Long term (current) use of antithrombotics/antiplatelets: Secondary | ICD-10-CM | POA: Diagnosis not present

## 2020-06-22 DIAGNOSIS — I2510 Atherosclerotic heart disease of native coronary artery without angina pectoris: Secondary | ICD-10-CM | POA: Diagnosis not present

## 2020-06-22 DIAGNOSIS — I130 Hypertensive heart and chronic kidney disease with heart failure and stage 1 through stage 4 chronic kidney disease, or unspecified chronic kidney disease: Secondary | ICD-10-CM | POA: Diagnosis not present

## 2020-06-22 DIAGNOSIS — N183 Chronic kidney disease, stage 3 unspecified: Secondary | ICD-10-CM | POA: Diagnosis not present

## 2020-06-22 DIAGNOSIS — I5023 Acute on chronic systolic (congestive) heart failure: Secondary | ICD-10-CM | POA: Diagnosis not present

## 2020-06-22 DIAGNOSIS — R2689 Other abnormalities of gait and mobility: Secondary | ICD-10-CM | POA: Diagnosis not present

## 2020-06-22 DIAGNOSIS — Z95810 Presence of automatic (implantable) cardiac defibrillator: Secondary | ICD-10-CM | POA: Diagnosis not present

## 2020-06-22 DIAGNOSIS — Z794 Long term (current) use of insulin: Secondary | ICD-10-CM | POA: Diagnosis not present

## 2020-06-22 DIAGNOSIS — E1122 Type 2 diabetes mellitus with diabetic chronic kidney disease: Secondary | ICD-10-CM | POA: Diagnosis not present

## 2020-06-22 NOTE — Telephone Encounter (Signed)
Spoke to Jenna West and let him know that Dr. Bettina Gavia would advise that they contact the patients PCP to get these orders. He verbalizes understanding and thanks me for the call back.

## 2020-06-22 NOTE — Telephone Encounter (Signed)
I spoke with Antonio. He is a Community education officer and saw patient for the first time today. He is asking if Dr Bettina Gavia would sign orders for PT and also for home health nurse. Antonio reports patient would benefit from home health nurse for medication management. Patient was recently discharged from [facility] and orders were written by hospital team.  Patient was admitted following a seizure.  I asked Antonio if he could follow up with PCP or neurology regarding home health orders.  He reports he talked with patient and her POA about this but they requested he call Dr Bettina Gavia as patient has not seen neurology yet and is changing primary care providers.   If Dr Bettina Gavia will sign orders patient will also need sooner appointment as she must be seen within 30 days of initial visit.  Current appointment is 07/27/20. I told Antonio I would send message to Dr Bettina Gavia to see if he would sign PT orders and order home [facility] will follow up with patient and POA regarding their plans for primary care.

## 2020-06-22 NOTE — Telephone Encounter (Signed)
New Message:    Please call, he says he needs to talk to a nurse about that patient.

## 2020-06-22 NOTE — Telephone Encounter (Signed)
Best for PCP

## 2020-06-26 DIAGNOSIS — I130 Hypertensive heart and chronic kidney disease with heart failure and stage 1 through stage 4 chronic kidney disease, or unspecified chronic kidney disease: Secondary | ICD-10-CM | POA: Diagnosis not present

## 2020-06-26 DIAGNOSIS — Z95810 Presence of automatic (implantable) cardiac defibrillator: Secondary | ICD-10-CM | POA: Diagnosis not present

## 2020-06-26 DIAGNOSIS — E1122 Type 2 diabetes mellitus with diabetic chronic kidney disease: Secondary | ICD-10-CM | POA: Diagnosis not present

## 2020-06-26 DIAGNOSIS — I2510 Atherosclerotic heart disease of native coronary artery without angina pectoris: Secondary | ICD-10-CM | POA: Diagnosis not present

## 2020-06-26 DIAGNOSIS — R2689 Other abnormalities of gait and mobility: Secondary | ICD-10-CM | POA: Diagnosis not present

## 2020-06-26 DIAGNOSIS — I5023 Acute on chronic systolic (congestive) heart failure: Secondary | ICD-10-CM | POA: Diagnosis not present

## 2020-06-26 DIAGNOSIS — Z7902 Long term (current) use of antithrombotics/antiplatelets: Secondary | ICD-10-CM | POA: Diagnosis not present

## 2020-06-26 DIAGNOSIS — Z794 Long term (current) use of insulin: Secondary | ICD-10-CM | POA: Diagnosis not present

## 2020-06-26 DIAGNOSIS — N183 Chronic kidney disease, stage 3 unspecified: Secondary | ICD-10-CM | POA: Diagnosis not present

## 2020-06-27 DIAGNOSIS — I2510 Atherosclerotic heart disease of native coronary artery without angina pectoris: Secondary | ICD-10-CM | POA: Diagnosis not present

## 2020-06-27 DIAGNOSIS — I5023 Acute on chronic systolic (congestive) heart failure: Secondary | ICD-10-CM | POA: Diagnosis not present

## 2020-06-27 DIAGNOSIS — Z7902 Long term (current) use of antithrombotics/antiplatelets: Secondary | ICD-10-CM | POA: Diagnosis not present

## 2020-06-27 DIAGNOSIS — R2689 Other abnormalities of gait and mobility: Secondary | ICD-10-CM | POA: Diagnosis not present

## 2020-06-27 DIAGNOSIS — Z95810 Presence of automatic (implantable) cardiac defibrillator: Secondary | ICD-10-CM | POA: Diagnosis not present

## 2020-06-27 DIAGNOSIS — Z794 Long term (current) use of insulin: Secondary | ICD-10-CM | POA: Diagnosis not present

## 2020-06-27 DIAGNOSIS — N183 Chronic kidney disease, stage 3 unspecified: Secondary | ICD-10-CM | POA: Diagnosis not present

## 2020-06-27 DIAGNOSIS — E1122 Type 2 diabetes mellitus with diabetic chronic kidney disease: Secondary | ICD-10-CM | POA: Diagnosis not present

## 2020-06-27 DIAGNOSIS — I130 Hypertensive heart and chronic kidney disease with heart failure and stage 1 through stage 4 chronic kidney disease, or unspecified chronic kidney disease: Secondary | ICD-10-CM | POA: Diagnosis not present

## 2020-06-28 ENCOUNTER — Ambulatory Visit: Payer: Medicare Other | Admitting: Student

## 2020-06-28 ENCOUNTER — Encounter: Payer: Self-pay | Admitting: Student

## 2020-06-28 ENCOUNTER — Other Ambulatory Visit: Payer: Self-pay

## 2020-06-28 VITALS — BP 128/66 | HR 87 | Ht 64.0 in | Wt 140.4 lb

## 2020-06-28 DIAGNOSIS — I255 Ischemic cardiomyopathy: Secondary | ICD-10-CM

## 2020-06-28 DIAGNOSIS — I472 Ventricular tachycardia, unspecified: Secondary | ICD-10-CM

## 2020-06-28 DIAGNOSIS — Z95810 Presence of automatic (implantable) cardiac defibrillator: Secondary | ICD-10-CM | POA: Diagnosis not present

## 2020-06-28 DIAGNOSIS — Z79899 Other long term (current) drug therapy: Secondary | ICD-10-CM | POA: Diagnosis not present

## 2020-06-28 DIAGNOSIS — I5022 Chronic systolic (congestive) heart failure: Secondary | ICD-10-CM

## 2020-06-28 LAB — CUP PACEART INCLINIC DEVICE CHECK
Battery Remaining Longevity: 25 mo
Battery Voltage: 2.92 V
Brady Statistic AP VP Percent: 0.03 %
Brady Statistic AP VS Percent: 58.33 %
Brady Statistic AS VP Percent: 0.02 %
Brady Statistic AS VS Percent: 41.63 %
Brady Statistic RA Percent Paced: 58.13 %
Brady Statistic RV Percent Paced: 0.04 %
Date Time Interrogation Session: 20211013105625
HighPow Impedance: 35 Ohm
HighPow Impedance: 48 Ohm
Implantable Lead Implant Date: 20091013
Implantable Lead Implant Date: 20091013
Implantable Lead Location: 753859
Implantable Lead Location: 753860
Implantable Lead Model: 4076
Implantable Lead Model: 6947
Implantable Pulse Generator Implant Date: 20150521
Lead Channel Impedance Value: 323 Ohm
Lead Channel Impedance Value: 342 Ohm
Lead Channel Impedance Value: 399 Ohm
Lead Channel Pacing Threshold Amplitude: 0.625 V
Lead Channel Pacing Threshold Amplitude: 1.25 V
Lead Channel Pacing Threshold Pulse Width: 0.4 ms
Lead Channel Pacing Threshold Pulse Width: 0.4 ms
Lead Channel Sensing Intrinsic Amplitude: 16.625 mV
Lead Channel Sensing Intrinsic Amplitude: 2.5 mV
Lead Channel Sensing Intrinsic Amplitude: 21.625 mV
Lead Channel Sensing Intrinsic Amplitude: 3.5 mV
Lead Channel Setting Pacing Amplitude: 1.5 V
Lead Channel Setting Pacing Amplitude: 2.5 V
Lead Channel Setting Pacing Pulse Width: 0.4 ms
Lead Channel Setting Sensing Sensitivity: 0.3 mV

## 2020-06-28 NOTE — Patient Instructions (Signed)
Medication Instructions:  *If you need a refill on your cardiac medications before your next appointment, please call your pharmacy*  Follow-Up: At [facility], you and your health needs are our priority.  As part of our continuing mission to provide you with exceptional heart care, we have created designated Provider Care Teams.  These Care Teams include your primary Cardiologist (physician) and Advanced Practice Providers (APPs -  Physician Assistants and Nurse Practitioners) who all work together to provide you with the care you need, when you need it.  We recommend signing up for the patient portal called "MyChart".  Sign up information is provided on this After Visit Summary.  MyChart is used to connect with patients for Virtual Visits (Telemedicine).  Patients are able to view lab/test results, encounter notes, upcoming appointments, etc.  Non-urgent messages can be sent to your provider as well.   To learn more about what you can do with MyChart, go to [email or web address].    Your next appointment:   Your physician recommends that you schedule a follow-up appointment in: 3 MONTHS with Dr. Curt Bears (in [place])   Remote monitoring is used to monitor your  ICD from home. This monitoring reduces the number of office visits required to check your device to one time per year. It allows [place] to keep an eye on the functioning of your device to ensure it is working properly. You are scheduled for a device check from home on 09/11/20. You may send your transmission at any time that day. If you have a wireless device, the transmission will be sent automatically. After your physician reviews your transmission, you will receive a postcard with your next transmission date.  The format for your next appointment:   In Person with Allegra Lai, MD

## 2020-06-28 NOTE — Progress Notes (Signed)
Electrophysiology Office Note Date: 06/28/2020  ID:  Jenna West, DOB [birth year], MRN [id number]  PCP: Jenna Hipps, MD Primary Cardiologist: Jenna More, MD Electrophysiologist: Jenna Meredith Leeds, MD   CC: Routine ICD follow-up  [name] is a 74 y.o. female seen today for Jenna Meredith Leeds, MD for routine electrophysiology followup.  Since last being seen in our clinic the patient reports doing OK. Her family discovered she had been missing some of her medications due to memory loss after several falls. She was also recently admitted for a new seizure. Followed by neurology. She is doing better with medications and overall with functional status.  she denies chest pain, palpitations, dyspnea, PND, orthopnea, nausea, vomiting, dizziness, syncope, edema, weight gain, or early satiety. She has not had ICD shocks.   Device History: Medtronic Dual Chamber ICD implanted 2015 for cardiomyopathy, ICM History of appropriate therapy: No History of AAD therapy: Yes; currently on amiodarone for NSVT   Past Medical History:  Diagnosis Date  . Acquired hypothyroidism 05/01/2015  . AICD (automatic cardioverter/defibrillator) present   . Angina pectoris (Jenna West) 07/13/2015  . Aspirin long-term use 03/07/2016  . Benign essential hypertension 05/01/2015  . Bilateral carotid artery stenosis 03/07/2016  . 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 Carotid artery stenosis 06/21/2015  . Chronic systolic heart failure (Jenna West) 05/28/2017  . Coronary arteriosclerosis in native artery 05/01/2015   Overview:  Overview:  PCI and stent to LAD and RCA 2004 for MI DES to LAD for subtotal occlusion with no reflow 8/09  . Coronary artery disease involving native coronary artery of native heart with angina pectoris (Jenna West) 05/01/2015   Overview:  Overview:  PCI and stent to LAD and RCA 2004 for MI DES to LAD for subtotal occlusion with no reflow 8/09  . Coronary artery  disease of native artery of native heart with stable angina pectoris (Jenna West) 05/01/2015   Overview:  PCI and stent to LAD and RCA 2004 for MI DES to LAD for subtotal occlusion with no reflow 2009  . Heartburn 05/29/2017  . 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 Hypertensive heart and kidney disease with HF and CKD (Jenna West) 05/28/2017  . ICD (implantable cardioverter-defibrillator) in place 05/01/2015   Overview:  Medtronic  . Ischemic cardiomyopathy 06/04/2017  . Left heart failure (Jenna West) 05/01/2015  . MI (myocardial infarction) (Jenna West) 05/01/2015  . On amiodarone therapy 08/15/2016  . Pure hypercholesterolemia 05/01/2015  . Stage 3 chronic kidney disease (Jenna West) 12/18/2016  . Syncope and collapse 05/01/2015  . Type 2 diabetes mellitus with stage 3 chronic kidney disease, with long-term current use of insulin (Jenna West) 07/17/2016  . VT (ventricular tachycardia) (Jenna West) 05/01/2015   Past Surgical History:  Procedure Laterality Date  . ABDOMINAL HYSTERECTOMY    . ANKLE FRACTURE SURGERY Bilateral    6 months apart, screws  . CARDIAC CATHETERIZATION     2003, 2009, 2016  . CAROTID STENT  06/21/2015   thoracic ao, 4 vessel cerebral arteriogram & rt internal carotid artery pta/stent Jenna West  . CATARACT EXTRACTION Right    With lens replaced  . COLONOSCOPY  2013  . CORONARY STENT PLACEMENT    . PACEMAKER PLACEMENT     has two, one working and one not, could not remove old one, Dr. Elonda West     Current Outpatient Medications  Medication Sig Dispense  Refill  . amiodarone (PACERONE) 200 MG tablet TAKE ONE-HALF TABLET BY  MOUTH ON MONDAY, WEDNESDAY, AND FRIDAY AS DIRECTED (Patient taking differently: Take 100 mg by mouth daily. ) 20 tablet 3  . aspirin EC 81 MG tablet Take 81 mg by mouth daily.    Marland Kitchen atorvastatin (LIPITOR) 80 MG tablet TAKE 1 TABLET BY MOUTH  DAILY (Patient taking differently: Take 80 mg by mouth at  bedtime. ) 90 tablet 1  . Blood Glucose Monitoring Suppl (ACCU-CHEK AVIVA PLUS) w/Device KIT Apply 1 Dose topically daily.    . carvedilol (COREG) 6.25 MG tablet TAKE 1 TABLET BY MOUTH  TWICE DAILY (Patient taking differently: Take 6.25 mg by mouth 2 (two) times daily with a meal. ) 180 tablet 3  . clopidogrel (PLAVIX) 75 MG tablet TAKE 1 TABLET BY MOUTH  DAILY (Patient taking differently: Take 75 mg by mouth daily. ) 90 tablet 1  . furosemide (LASIX) 20 MG tablet TAKE 1 TABLET BY MOUTH  DAILY (Patient taking differently: Take 20 mg by mouth daily. ) 90 tablet 3  . HUMALOG 100 UNIT/ML injection Inject 12 Units into the skin daily.     . insulin glargine (LANTUS) 100 UNIT/ML injection Inject 10 Units into the skin daily.    Marland Kitchen levothyroxine (SYNTHROID, LEVOTHROID) 100 MCG tablet Take 100 mcg by mouth daily before breakfast.     . nitroGLYCERIN (NITROLINGUAL) 0.4 MG/SPRAY spray Place 1 spray under the tongue every 5 (five) minutes x 3 doses as needed for chest pain. 12 g 12  . ranolazine (RANEXA) 500 MG 12 hr tablet Take 500 mg by mouth daily.     . sacubitril-valsartan (ENTRESTO) 24-26 MG Take 1 tablet by mouth 2 (two) times daily. 60 tablet 3  . sertraline (ZOLOFT) 50 MG tablet Take 50 mg by mouth daily.    Marland Kitchen spironolactone (ALDACTONE) 25 MG tablet TAKE ONE-HALF TABLET BY  MOUTH DAILY (Patient taking differently: Take 12.5 mg by mouth every other day. ) 45 tablet 1  . Vitamin D, Ergocalciferol, (DRISDOL) 1.25 MG (50000 UT) CAPS capsule Take 50,000 Units by mouth every Wednesday.     . zolpidem (AMBIEN) 10 MG tablet Take 10 mg by mouth at bedtime.     No current facility-administered medications for this visit.    Allergies:   Shellfish allergy and Sulfa antibiotics   Social History: Social History   Socioeconomic History  . Marital status: Married    Spouse name: Not on file  . Number of children: Not on file  . Years of education: Not on file  . Highest education level: Not on file    Occupational History  . Not on file  Tobacco Use  . Smoking status: Never Smoker  . Smokeless tobacco: Never Used  Vaping Use  . Vaping Use: Never used  Substance and Sexual Activity  . Alcohol use: No  . Drug use: No  . Sexual activity: Not on file  Other Topics Concern  . Not on file  Social History Narrative  . Not on file   Social Determinants of Health   Financial Resource Strain:   . Difficulty of Paying Living Expenses: Not on file  Food Insecurity:   . Worried About Charity fundraiser in the Last Year: Not on file  . Ran Out of Food in the Last Year: Not on file  Transportation Needs:   . Lack of Transportation (Medical): Not on file  . Lack of Transportation (Non-Medical): Not on file  Physical Activity:   . Days of Exercise per Week: Not on file  . Minutes of Exercise per Session: Not on file  Stress:   . Feeling of Stress : Not on file  Social Connections:   . Frequency of Communication with Friends and Family: Not on file  . Frequency of Social Gatherings with Friends and Family: Not on file  . Attends Religious Services: Not on file  . Active Member of Clubs or Organizations: Not on file  . Attends Archivist Meetings: Not on file  . Marital Status: Not on file  Intimate Partner Violence:   . Fear of Current or Ex-Partner: Not on file  . Emotionally Abused: Not on file  . Physically Abused: Not on file  . Sexually Abused: Not on file    Family History: Family History  Problem Relation Age of Onset  . Heart attack Father   . Heart disease Father   . Brain cancer Mother   . Diabetes Sister   . Breast cancer Sister   . Rectal cancer Daughter     Review of Systems: All other systems reviewed and are otherwise negative except as noted above.   Physical Exam: Vitals:   06/28/20 0955  BP: 128/66  Pulse: 87  SpO2: 99%  Weight: 140 lb 6.4 oz (63.7 kg)  Height: '5\' 4"'  (1.626 m)     GEN- The patient is well appearing, alert and  oriented x 3 today.   HEENT: normocephalic, atraumatic; sclera clear, conjunctiva pink; hearing intact; oropharynx clear; neck supple, no JVP Lymph- no cervical lymphadenopathy Lungs- Clear to ausculation bilaterally, normal work of breathing.  No wheezes, rales, rhonchi Heart- Regular rate and rhythm, no murmurs, rubs or gallops, PMI not laterally displaced GI- soft, non-tender, non-distended, bowel sounds present, no hepatosplenomegaly Extremities- no clubbing or cyanosis. No edema; DP/PT/radial pulses 2+ bilaterally MS- no significant deformity or atrophy Skin- warm and dry, no rash or lesion; ICD pocket well healed Psych- euthymic mood, full affect Neuro- strength and sensation are intact  ICD interrogation- reviewed in detail today,  See PACEART report  EKG:  EKG is not ordered today.  Recent Labs: 07/28/2019: NT-Pro BNP 8,503 02/28/2020: Magnesium 1.8 06/18/2020: ALT 21; B Natriuretic Peptide 714.1; TSH 3.705 06/20/2020: BUN 16; Creatinine, Ser 1.73; Hemoglobin 11.3; Platelets 88; Potassium 4.5; Sodium 139   Wt Readings from Last 3 Encounters:  06/28/20 140 lb 6.4 oz (63.7 kg)  06/20/20 146 lb 9.7 oz (66.5 kg)  02/28/20 161 lb 6.4 oz (73.2 kg)     Other studies Reviewed: Additional studies/ records that were reviewed today include: Previous EP office notes, recent admission notes.    Assessment and Plan:  1.  Chronic systolic dysfunction s/p Medtronic dual chamber ICD  euvolemic today by exam Stable on an appropriate medical regimen Normal ICD function See Pace Art report  No changes today  2. VT Last ATP 9/20. No further since.  She had not been taking some of her medications per her daughter, is now back on all.  Continue amiodarone 100 mg M/W/F. Recent labs stable.  She had low potassium when admitted 06/18/2020 for seizures. ? If her recent string of episodes on her 05/2020 remote were due to medical non-compliance. These have quieted down significantly in the past 3  weeks.  3. Syncope No further  4. Seizure Recent admission for new onset seizure. Following with neuro.   Current medicines are reviewed at length with the patient today.   The patient  does not have concerns regarding her medicines.  The following changes were made today:  none  Labs/ tests ordered today include:  No orders of the defined types were placed in this encounter.  Disposition:   Follow up with Dr. Curt Bears  3 months   Signed, Shirley Friar, PA-C  06/28/2020 11:00 AM  [place] [address] [place] [address] [phone] (office) [phone] (fax)

## 2020-06-29 DIAGNOSIS — E119 Type 2 diabetes mellitus without complications: Secondary | ICD-10-CM | POA: Diagnosis not present

## 2020-06-29 DIAGNOSIS — R55 Syncope and collapse: Secondary | ICD-10-CM | POA: Diagnosis not present

## 2020-07-03 DIAGNOSIS — R569 Unspecified convulsions: Secondary | ICD-10-CM | POA: Diagnosis not present

## 2020-07-03 DIAGNOSIS — Z6824 Body mass index (BMI) 24.0-24.9, adult: Secondary | ICD-10-CM | POA: Diagnosis not present

## 2020-07-03 DIAGNOSIS — Z23 Encounter for immunization: Secondary | ICD-10-CM | POA: Diagnosis not present

## 2020-07-04 DIAGNOSIS — Z794 Long term (current) use of insulin: Secondary | ICD-10-CM | POA: Diagnosis not present

## 2020-07-04 DIAGNOSIS — I130 Hypertensive heart and chronic kidney disease with heart failure and stage 1 through stage 4 chronic kidney disease, or unspecified chronic kidney disease: Secondary | ICD-10-CM | POA: Diagnosis not present

## 2020-07-04 DIAGNOSIS — E1122 Type 2 diabetes mellitus with diabetic chronic kidney disease: Secondary | ICD-10-CM | POA: Diagnosis not present

## 2020-07-04 DIAGNOSIS — I2510 Atherosclerotic heart disease of native coronary artery without angina pectoris: Secondary | ICD-10-CM | POA: Diagnosis not present

## 2020-07-04 DIAGNOSIS — Z7902 Long term (current) use of antithrombotics/antiplatelets: Secondary | ICD-10-CM | POA: Diagnosis not present

## 2020-07-04 DIAGNOSIS — Z95810 Presence of automatic (implantable) cardiac defibrillator: Secondary | ICD-10-CM | POA: Diagnosis not present

## 2020-07-04 DIAGNOSIS — R2689 Other abnormalities of gait and mobility: Secondary | ICD-10-CM | POA: Diagnosis not present

## 2020-07-04 DIAGNOSIS — N183 Chronic kidney disease, stage 3 unspecified: Secondary | ICD-10-CM | POA: Diagnosis not present

## 2020-07-04 DIAGNOSIS — I5023 Acute on chronic systolic (congestive) heart failure: Secondary | ICD-10-CM | POA: Diagnosis not present

## 2020-07-06 DIAGNOSIS — Z95810 Presence of automatic (implantable) cardiac defibrillator: Secondary | ICD-10-CM | POA: Diagnosis not present

## 2020-07-06 DIAGNOSIS — I5023 Acute on chronic systolic (congestive) heart failure: Secondary | ICD-10-CM | POA: Diagnosis not present

## 2020-07-06 DIAGNOSIS — Z794 Long term (current) use of insulin: Secondary | ICD-10-CM | POA: Diagnosis not present

## 2020-07-06 DIAGNOSIS — R2689 Other abnormalities of gait and mobility: Secondary | ICD-10-CM | POA: Diagnosis not present

## 2020-07-06 DIAGNOSIS — E1122 Type 2 diabetes mellitus with diabetic chronic kidney disease: Secondary | ICD-10-CM | POA: Diagnosis not present

## 2020-07-06 DIAGNOSIS — N183 Chronic kidney disease, stage 3 unspecified: Secondary | ICD-10-CM | POA: Diagnosis not present

## 2020-07-06 DIAGNOSIS — I130 Hypertensive heart and chronic kidney disease with heart failure and stage 1 through stage 4 chronic kidney disease, or unspecified chronic kidney disease: Secondary | ICD-10-CM | POA: Diagnosis not present

## 2020-07-06 DIAGNOSIS — I2510 Atherosclerotic heart disease of native coronary artery without angina pectoris: Secondary | ICD-10-CM | POA: Diagnosis not present

## 2020-07-06 DIAGNOSIS — Z7902 Long term (current) use of antithrombotics/antiplatelets: Secondary | ICD-10-CM | POA: Diagnosis not present

## 2020-07-10 DIAGNOSIS — I2510 Atherosclerotic heart disease of native coronary artery without angina pectoris: Secondary | ICD-10-CM | POA: Diagnosis not present

## 2020-07-10 DIAGNOSIS — N183 Chronic kidney disease, stage 3 unspecified: Secondary | ICD-10-CM | POA: Diagnosis not present

## 2020-07-10 DIAGNOSIS — E1122 Type 2 diabetes mellitus with diabetic chronic kidney disease: Secondary | ICD-10-CM | POA: Diagnosis not present

## 2020-07-10 DIAGNOSIS — I130 Hypertensive heart and chronic kidney disease with heart failure and stage 1 through stage 4 chronic kidney disease, or unspecified chronic kidney disease: Secondary | ICD-10-CM | POA: Diagnosis not present

## 2020-07-10 DIAGNOSIS — I5023 Acute on chronic systolic (congestive) heart failure: Secondary | ICD-10-CM | POA: Diagnosis not present

## 2020-07-10 DIAGNOSIS — Z794 Long term (current) use of insulin: Secondary | ICD-10-CM | POA: Diagnosis not present

## 2020-07-10 DIAGNOSIS — Z95810 Presence of automatic (implantable) cardiac defibrillator: Secondary | ICD-10-CM | POA: Diagnosis not present

## 2020-07-10 DIAGNOSIS — Z7902 Long term (current) use of antithrombotics/antiplatelets: Secondary | ICD-10-CM | POA: Diagnosis not present

## 2020-07-10 DIAGNOSIS — R2689 Other abnormalities of gait and mobility: Secondary | ICD-10-CM | POA: Diagnosis not present

## 2020-07-11 DIAGNOSIS — Z794 Long term (current) use of insulin: Secondary | ICD-10-CM | POA: Diagnosis not present

## 2020-07-11 DIAGNOSIS — N183 Chronic kidney disease, stage 3 unspecified: Secondary | ICD-10-CM | POA: Diagnosis not present

## 2020-07-11 DIAGNOSIS — Z95810 Presence of automatic (implantable) cardiac defibrillator: Secondary | ICD-10-CM | POA: Diagnosis not present

## 2020-07-11 DIAGNOSIS — I5023 Acute on chronic systolic (congestive) heart failure: Secondary | ICD-10-CM | POA: Diagnosis not present

## 2020-07-11 DIAGNOSIS — I2510 Atherosclerotic heart disease of native coronary artery without angina pectoris: Secondary | ICD-10-CM | POA: Diagnosis not present

## 2020-07-11 DIAGNOSIS — R2689 Other abnormalities of gait and mobility: Secondary | ICD-10-CM | POA: Diagnosis not present

## 2020-07-11 DIAGNOSIS — I130 Hypertensive heart and chronic kidney disease with heart failure and stage 1 through stage 4 chronic kidney disease, or unspecified chronic kidney disease: Secondary | ICD-10-CM | POA: Diagnosis not present

## 2020-07-11 DIAGNOSIS — Z7902 Long term (current) use of antithrombotics/antiplatelets: Secondary | ICD-10-CM | POA: Diagnosis not present

## 2020-07-11 DIAGNOSIS — E1122 Type 2 diabetes mellitus with diabetic chronic kidney disease: Secondary | ICD-10-CM | POA: Diagnosis not present

## 2020-07-13 DIAGNOSIS — Z794 Long term (current) use of insulin: Secondary | ICD-10-CM | POA: Diagnosis not present

## 2020-07-13 DIAGNOSIS — Z7902 Long term (current) use of antithrombotics/antiplatelets: Secondary | ICD-10-CM | POA: Diagnosis not present

## 2020-07-13 DIAGNOSIS — R2689 Other abnormalities of gait and mobility: Secondary | ICD-10-CM | POA: Diagnosis not present

## 2020-07-13 DIAGNOSIS — Z95810 Presence of automatic (implantable) cardiac defibrillator: Secondary | ICD-10-CM | POA: Diagnosis not present

## 2020-07-13 DIAGNOSIS — I5023 Acute on chronic systolic (congestive) heart failure: Secondary | ICD-10-CM | POA: Diagnosis not present

## 2020-07-13 DIAGNOSIS — I130 Hypertensive heart and chronic kidney disease with heart failure and stage 1 through stage 4 chronic kidney disease, or unspecified chronic kidney disease: Secondary | ICD-10-CM | POA: Diagnosis not present

## 2020-07-13 DIAGNOSIS — E1122 Type 2 diabetes mellitus with diabetic chronic kidney disease: Secondary | ICD-10-CM | POA: Diagnosis not present

## 2020-07-13 DIAGNOSIS — I2510 Atherosclerotic heart disease of native coronary artery without angina pectoris: Secondary | ICD-10-CM | POA: Diagnosis not present

## 2020-07-13 DIAGNOSIS — N183 Chronic kidney disease, stage 3 unspecified: Secondary | ICD-10-CM | POA: Diagnosis not present

## 2020-07-15 DIAGNOSIS — I110 Hypertensive heart disease with heart failure: Secondary | ICD-10-CM | POA: Diagnosis not present

## 2020-07-15 DIAGNOSIS — I509 Heart failure, unspecified: Secondary | ICD-10-CM | POA: Diagnosis not present

## 2020-07-15 DIAGNOSIS — E7849 Other hyperlipidemia: Secondary | ICD-10-CM | POA: Diagnosis not present

## 2020-07-15 DIAGNOSIS — E1169 Type 2 diabetes mellitus with other specified complication: Secondary | ICD-10-CM | POA: Diagnosis not present

## 2020-07-18 DIAGNOSIS — Z95810 Presence of automatic (implantable) cardiac defibrillator: Secondary | ICD-10-CM | POA: Diagnosis not present

## 2020-07-18 DIAGNOSIS — I5023 Acute on chronic systolic (congestive) heart failure: Secondary | ICD-10-CM | POA: Diagnosis not present

## 2020-07-18 DIAGNOSIS — N183 Chronic kidney disease, stage 3 unspecified: Secondary | ICD-10-CM | POA: Diagnosis not present

## 2020-07-18 DIAGNOSIS — R2689 Other abnormalities of gait and mobility: Secondary | ICD-10-CM | POA: Diagnosis not present

## 2020-07-18 DIAGNOSIS — I2510 Atherosclerotic heart disease of native coronary artery without angina pectoris: Secondary | ICD-10-CM | POA: Diagnosis not present

## 2020-07-18 DIAGNOSIS — Z7902 Long term (current) use of antithrombotics/antiplatelets: Secondary | ICD-10-CM | POA: Diagnosis not present

## 2020-07-18 DIAGNOSIS — I130 Hypertensive heart and chronic kidney disease with heart failure and stage 1 through stage 4 chronic kidney disease, or unspecified chronic kidney disease: Secondary | ICD-10-CM | POA: Diagnosis not present

## 2020-07-18 DIAGNOSIS — E1122 Type 2 diabetes mellitus with diabetic chronic kidney disease: Secondary | ICD-10-CM | POA: Diagnosis not present

## 2020-07-18 DIAGNOSIS — Z794 Long term (current) use of insulin: Secondary | ICD-10-CM | POA: Diagnosis not present

## 2020-07-19 DIAGNOSIS — E1122 Type 2 diabetes mellitus with diabetic chronic kidney disease: Secondary | ICD-10-CM | POA: Diagnosis not present

## 2020-07-19 DIAGNOSIS — Z95810 Presence of automatic (implantable) cardiac defibrillator: Secondary | ICD-10-CM | POA: Diagnosis not present

## 2020-07-19 DIAGNOSIS — R2689 Other abnormalities of gait and mobility: Secondary | ICD-10-CM | POA: Diagnosis not present

## 2020-07-19 DIAGNOSIS — I130 Hypertensive heart and chronic kidney disease with heart failure and stage 1 through stage 4 chronic kidney disease, or unspecified chronic kidney disease: Secondary | ICD-10-CM | POA: Diagnosis not present

## 2020-07-19 DIAGNOSIS — Z7902 Long term (current) use of antithrombotics/antiplatelets: Secondary | ICD-10-CM | POA: Diagnosis not present

## 2020-07-19 DIAGNOSIS — N183 Chronic kidney disease, stage 3 unspecified: Secondary | ICD-10-CM | POA: Diagnosis not present

## 2020-07-19 DIAGNOSIS — Z794 Long term (current) use of insulin: Secondary | ICD-10-CM | POA: Diagnosis not present

## 2020-07-19 DIAGNOSIS — I5023 Acute on chronic systolic (congestive) heart failure: Secondary | ICD-10-CM | POA: Diagnosis not present

## 2020-07-19 DIAGNOSIS — I2510 Atherosclerotic heart disease of native coronary artery without angina pectoris: Secondary | ICD-10-CM | POA: Diagnosis not present

## 2020-07-20 DIAGNOSIS — Z23 Encounter for immunization: Secondary | ICD-10-CM | POA: Diagnosis not present

## 2020-07-20 DIAGNOSIS — R2689 Other abnormalities of gait and mobility: Secondary | ICD-10-CM | POA: Diagnosis not present

## 2020-07-20 DIAGNOSIS — Z794 Long term (current) use of insulin: Secondary | ICD-10-CM | POA: Diagnosis not present

## 2020-07-20 DIAGNOSIS — N183 Chronic kidney disease, stage 3 unspecified: Secondary | ICD-10-CM | POA: Diagnosis not present

## 2020-07-20 DIAGNOSIS — I130 Hypertensive heart and chronic kidney disease with heart failure and stage 1 through stage 4 chronic kidney disease, or unspecified chronic kidney disease: Secondary | ICD-10-CM | POA: Diagnosis not present

## 2020-07-20 DIAGNOSIS — I5023 Acute on chronic systolic (congestive) heart failure: Secondary | ICD-10-CM | POA: Diagnosis not present

## 2020-07-20 DIAGNOSIS — I2510 Atherosclerotic heart disease of native coronary artery without angina pectoris: Secondary | ICD-10-CM | POA: Diagnosis not present

## 2020-07-20 DIAGNOSIS — E1122 Type 2 diabetes mellitus with diabetic chronic kidney disease: Secondary | ICD-10-CM | POA: Diagnosis not present

## 2020-07-20 DIAGNOSIS — Z95810 Presence of automatic (implantable) cardiac defibrillator: Secondary | ICD-10-CM | POA: Diagnosis not present

## 2020-07-20 DIAGNOSIS — Z7902 Long term (current) use of antithrombotics/antiplatelets: Secondary | ICD-10-CM | POA: Diagnosis not present

## 2020-07-25 DIAGNOSIS — E1122 Type 2 diabetes mellitus with diabetic chronic kidney disease: Secondary | ICD-10-CM | POA: Diagnosis not present

## 2020-07-25 DIAGNOSIS — Z95810 Presence of automatic (implantable) cardiac defibrillator: Secondary | ICD-10-CM | POA: Diagnosis not present

## 2020-07-25 DIAGNOSIS — I130 Hypertensive heart and chronic kidney disease with heart failure and stage 1 through stage 4 chronic kidney disease, or unspecified chronic kidney disease: Secondary | ICD-10-CM | POA: Diagnosis not present

## 2020-07-25 DIAGNOSIS — Z794 Long term (current) use of insulin: Secondary | ICD-10-CM | POA: Diagnosis not present

## 2020-07-25 DIAGNOSIS — Z7902 Long term (current) use of antithrombotics/antiplatelets: Secondary | ICD-10-CM | POA: Diagnosis not present

## 2020-07-25 DIAGNOSIS — R2689 Other abnormalities of gait and mobility: Secondary | ICD-10-CM | POA: Diagnosis not present

## 2020-07-25 DIAGNOSIS — I2510 Atherosclerotic heart disease of native coronary artery without angina pectoris: Secondary | ICD-10-CM | POA: Diagnosis not present

## 2020-07-25 DIAGNOSIS — I5023 Acute on chronic systolic (congestive) heart failure: Secondary | ICD-10-CM | POA: Diagnosis not present

## 2020-07-25 DIAGNOSIS — N183 Chronic kidney disease, stage 3 unspecified: Secondary | ICD-10-CM | POA: Diagnosis not present

## 2020-07-25 NOTE — Progress Notes (Signed)
Cardiology Office Note:    Date:  07/27/2020   ID:  Jenna West, DOB May 29, 1946, MRN [id number]  PCP:  Jenna Hipps, MD  Cardiologist:  Jenna More, MD    Referring MD: Jenna Hipps, MD    ASSESSMENT:    1. Chronic systolic (congestive) heart failure ([place])   2. On amiodarone therapy   3. VT (ventricular tachycardia) ([place])   4. ICD (implantable cardioverter-defibrillator) in place   5. Coronary artery disease involving native coronary artery of native heart with angina pectoris ([place])   6. 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 Pure hypercholesterolemia    PLAN:    In order of problems listed above:  1. She continues to slowly deteriorate mostly with weight loss weakness and what appears to be developing cardiac cachexia but her heart failure remains compensated she will continue her current loop and distal diuretic beta-blocker Entresto and MRA.  We discussed adding vericiquat however I would hold for a clinical signal of worsening symptoms of heart failure or hospitalization. 2. Continue low-dose amiodarone has not had clinical VT or syncopal episodes or seizure disorder.  Recent TSH in the hospital was normal 3.75 and liver function normal. 3. Stable her device is followed in our clinic she is now dual-chamber paced 4. Stable CAD continue medical therapy including long-term dual antiplatelet and lipid-lowering having no angina New York Heart Association class I 5. Relatively stable class II my greatest concern of her is her weight loss what appears to be cardiac cachexia and strongly encouraged her to add fast protein to her diet 1-2 eggs per day to maintain muscle mass 6. Continue high intensity statin 7. We will recheck a BMP proBNP a month ago her potassium is decreased 3.2 and GFR worsened creatinine 1.75 GFR 28 cc and she will come back to the office for recheck   Next  appointment: 3 months   Medication Adjustments/Labs and Tests Ordered: Current medicines are reviewed at length with the patient today.  Concerns regarding medicines are outlined above.  No orders of the defined types were placed in this encounter.  No orders of the defined types were placed in this encounter.   Chief Complaint  Patient presents with  . Follow-up    She has had a loss of consciousness without VT VF or ICD therapy and has a new diagnosis of seizure disorder  . Congestive Heart Failure    With severe left ventricular dysfunction  . Coronary Artery Disease    With remote PCI LAD and right coronary artery    History of Present Illness:    Jenna West is a 74 y.o. female with a hx of CAD with PCI of the LAD and right coronary artery 2004, LAD 2009, severe LV dysfunction EF 20 to 25% with aneurysm heart failure hypertensive heart disease ventricular tachycardia on amiodarone with ICD and stage IV CKD last seen 01/13/2020.  She has had admissions to the hospital with seizure disorder. Compliance with diet, lifestyle and medications: Yes  She is struggling with her weight loss approaching 40 pounds with poor appetite and starting to look cachectic.  She is diffusely weak short of breath when she is outside the home little or no edema no orthopnea chest pain palpitation or syncope.  Her son-in-law died she is lost to the support of her stepdaughter who was very attentive she is an Therapist, sports at a local  hospital.  She is socially isolated. Past Medical History:  Diagnosis Date  . Acquired hypothyroidism 05/01/2015  . AICD (automatic cardioverter/defibrillator) present   . Angina pectoris ([place]) 07/13/2015  . Aspirin long-term use 03/07/2016  . Benign essential hypertension 05/01/2015  . Bilateral carotid artery stenosis 03/07/2016  . Cardiomyopathy ([place]) 05/01/2015   Overview:  EF 20% with aneurysm of the apex  . Carotid artery stenosis 06/21/2015  . Chronic systolic heart failure  ([place]) 05/28/2017  . Coronary arteriosclerosis in native artery 05/01/2015   Overview:  Overview:  PCI and stent to LAD and RCA 2004 for MI DES to LAD for subtotal occlusion with no reflow 8/09  . Coronary artery disease involving native coronary artery of native heart with angina pectoris ([place]) 05/01/2015   Overview:  Overview:  PCI and stent to LAD and RCA 2004 for MI DES to LAD for subtotal occlusion with no reflow 8/09  . Coronary artery disease of native artery of native heart with stable angina pectoris ([place]) 05/01/2015   Overview:  PCI and stent to LAD and RCA 2004 for MI DES to LAD for subtotal occlusion with no reflow 2009  . Heartburn 05/29/2017  . 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 Hypertensive heart and kidney disease with HF and CKD ([place]) 05/28/2017  . ICD (implantable cardioverter-defibrillator) in place 05/01/2015   Overview:  [employer]  . Ischemic cardiomyopathy 06/04/2017  . Left heart failure ([place]) 05/01/2015  . MI (myocardial infarction) ([place]) 05/01/2015  . On amiodarone therapy 08/15/2016  . Pure hypercholesterolemia 05/01/2015  . Stage 3 chronic kidney disease ([place]) 12/18/2016  . Syncope and collapse 05/01/2015  . Type 2 diabetes mellitus with stage 3 chronic kidney disease, with long-term current use of insulin ([place]) 07/17/2016  . VT (ventricular tachycardia) ([place]) 05/01/2015    Past Surgical History:  Procedure Laterality Date  . ABDOMINAL HYSTERECTOMY    . ANKLE FRACTURE SURGERY Bilateral    6 months apart, screws  . CARDIAC CATHETERIZATION     2003, 2009, 2016  . CAROTID STENT  06/21/2015   thoracic ao, 4 vessel cerebral arteriogram & rt internal carotid artery pta/stent CPT 53202  . CATARACT EXTRACTION Right    With lens replaced  . COLONOSCOPY  2013  . CORONARY STENT PLACEMENT    . PACEMAKER PLACEMENT     has two, one working and one not, could not remove old one, Dr.  Elonda West     Current Medications: Current Meds  Medication Sig  . amiodarone (PACERONE) 200 MG tablet TAKE ONE-HALF TABLET BY  MOUTH ON MONDAY, WEDNESDAY, AND FRIDAY AS DIRECTED (Patient taking differently: Take 100 mg by mouth daily. )  . aspirin EC 81 MG tablet Take 81 mg by mouth daily.  Marland Kitchen atorvastatin (LIPITOR) 80 MG tablet TAKE 1 TABLET BY MOUTH  DAILY (Patient taking differently: Take 80 mg by mouth at bedtime. )  . Blood Glucose Monitoring Suppl (ACCU-CHEK AVIVA PLUS) w/Device KIT Apply 1 Dose topically daily.  . carvedilol (COREG) 6.25 MG tablet TAKE 1 TABLET BY MOUTH  TWICE DAILY (Patient taking differently: Take 6.25 mg by mouth 2 (two) times daily with a meal. )  . clopidogrel (PLAVIX) 75 MG tablet TAKE 1 TABLET BY MOUTH  DAILY (Patient taking differently: Take 75 mg by mouth daily. )  . furosemide (LASIX) 20 MG tablet TAKE 1 TABLET BY MOUTH  DAILY (Patient taking differently: Take 20 mg by  mouth daily. )  . HUMALOG 100 UNIT/ML injection Inject 12 Units into the skin daily.   . insulin glargine (LANTUS) 100 UNIT/ML injection Inject 10 Units into the skin daily.  Marland Kitchen levothyroxine (SYNTHROID, LEVOTHROID) 100 MCG tablet Take 100 mcg by mouth daily before breakfast.   . nitroGLYCERIN (NITROLINGUAL) 0.4 MG/SPRAY spray Place 1 spray under the tongue every 5 (five) minutes x 3 doses as needed for chest pain.  . ranolazine (RANEXA) 500 MG 12 hr tablet Take 500 mg by mouth daily.   . sacubitril-valsartan (ENTRESTO) 24-26 MG Take 1 tablet by mouth 2 (two) times daily.  . sertraline (ZOLOFT) 50 MG tablet Take 50 mg by mouth daily.  Marland Kitchen spironolactone (ALDACTONE) 25 MG tablet TAKE ONE-HALF TABLET BY  MOUTH DAILY (Patient taking differently: Take 12.5 mg by mouth every other day. )  . Vitamin D, Ergocalciferol, (DRISDOL) 1.25 MG (50000 UT) CAPS capsule Take 50,000 Units by mouth every Wednesday.   . zolpidem (AMBIEN) 10 MG tablet Take 10 mg by mouth at bedtime.     Allergies:   Shellfish allergy and  Sulfa antibiotics   Social History   Socioeconomic History  . Marital status: Married    Spouse name: Not on file  . Number of children: Not on file  . Years of education: Not on file  . Highest education level: Not on file  Occupational History  . Not on file  Tobacco Use  . Smoking status: Never Smoker  . Smokeless tobacco: Never Used  Vaping Use  . Vaping Use: Never used  Substance and Sexual Activity  . Alcohol use: No  . Drug use: No  . Sexual activity: Not on file  Other Topics Concern  . Not on file  Social History Narrative  . Not on file   Social Determinants of Health   Financial Resource Strain:   . Difficulty of Paying Living Expenses: Not on file  Food Insecurity:   . Worried About Charity fundraiser in the Last Year: Not on file  . Ran Out of Food in the Last Year: Not on file  Transportation Needs:   . Lack of Transportation (Medical): Not on file  . Lack of Transportation (Non-Medical): Not on file  Physical Activity:   . Days of Exercise per Week: Not on file  . Minutes of Exercise per Session: Not on file  Stress:   . Feeling of Stress : Not on file  Social Connections:   . Frequency of Communication with Friends and Family: Not on file  . Frequency of Social Gatherings with Friends and Family: Not on file  . Attends Religious Services: Not on file  . Active Member of Clubs or Organizations: Not on file  . Attends Archivist Meetings: Not on file  . Marital Status: Not on file     Family History: The patient's family history includes Brain cancer in her mother; Breast cancer in her sister; Diabetes in her sister; Heart attack in her father; Heart disease in her father; Rectal cancer in her daughter. ROS:   Please see the history of present illness.    All other systems reviewed and are negative.  EKGs/Labs/Other Studies Reviewed:    The following studies were reviewed today:  EKG:  EKG ordered today and personally reviewed.  The  ekg ordered today demonstrates shows her to be dual-chamber paced  Recent Labs: 07/28/2019: NT-Pro BNP 8,503 02/28/2020: Magnesium 1.8 06/18/2020: ALT 21; B Natriuretic Peptide 714.1; TSH 3.705 06/20/2020:  BUN 16; Creatinine, Ser 1.73; Hemoglobin 11.3; Platelets 88; Potassium 4.5; Sodium 139  Recent Lipid Panel    Component Value Date/Time   CHOL 86 (L) 07/13/2019 1040   TRIG 63 07/13/2019 1040   HDL 35 (L) 07/13/2019 1040   CHOLHDL 2.5 07/13/2019 1040   LDLCALC 37 07/13/2019 1040    Physical Exam:    VS:  Ht _0  (1.626 m)   Wt 144 lb (65.3 kg)   BMI 24.72 kg/m     Wt Readings from Last 3 Encounters:  07/27/20 144 lb (65.3 kg)  06/28/20 140 lb 6.4 oz (63.7 kg)  06/20/20 146 lb 9.7 oz (66.5 kg)     GEN: She looks very frail her muscle mass beginning to look cachecticin no acute distress HEENT: Normal NECK: No JVD; No carotid bruits LYMPHATICS: No lymphadenopathy CARDIAC: Soft S1 S3 is present RRR, no murmurs, rubs, gallops RESPIRATORY:  Clear to auscultation without rales, wheezing or rhonchi  ABDOMEN: Soft, non-tender, non-distended MUSCULOSKELETAL: Right lower extremity only trace pitting edema; No deformity  SKIN: Warm and dry NEUROLOGIC:  Alert and oriented x 3 PSYCHIATRIC:  Normal affect    Signed, Jenna More, MD  07/27/2020 8:21 AM    [facility]

## 2020-07-27 ENCOUNTER — Encounter: Payer: Self-pay | Admitting: Cardiology

## 2020-07-27 ENCOUNTER — Other Ambulatory Visit: Payer: Self-pay

## 2020-07-27 ENCOUNTER — Ambulatory Visit: Payer: Medicare Other | Admitting: Cardiology

## 2020-07-27 VITALS — BP 111/71 | HR 71 | Ht 64.0 in | Wt 144.0 lb

## 2020-07-27 DIAGNOSIS — Z95810 Presence of automatic (implantable) cardiac defibrillator: Secondary | ICD-10-CM

## 2020-07-27 DIAGNOSIS — I5022 Chronic systolic (congestive) heart failure: Secondary | ICD-10-CM

## 2020-07-27 DIAGNOSIS — I472 Ventricular tachycardia, unspecified: Secondary | ICD-10-CM

## 2020-07-27 DIAGNOSIS — N184 Chronic kidney disease, stage 4 (severe): Secondary | ICD-10-CM

## 2020-07-27 DIAGNOSIS — Z79899 Other long term (current) drug therapy: Secondary | ICD-10-CM

## 2020-07-27 DIAGNOSIS — I25119 Atherosclerotic heart disease of native coronary artery with unspecified angina pectoris: Secondary | ICD-10-CM

## 2020-07-27 DIAGNOSIS — I130 Hypertensive heart and chronic kidney disease with heart failure and stage 1 through stage 4 chronic kidney disease, or unspecified chronic kidney disease: Secondary | ICD-10-CM

## 2020-07-27 DIAGNOSIS — E7800 Pure hypercholesterolemia, unspecified: Secondary | ICD-10-CM

## 2020-07-27 NOTE — Patient Instructions (Signed)

## 2020-08-01 DIAGNOSIS — L820 Inflamed seborrheic keratosis: Secondary | ICD-10-CM | POA: Diagnosis not present

## 2020-08-04 DIAGNOSIS — I5023 Acute on chronic systolic (congestive) heart failure: Secondary | ICD-10-CM | POA: Diagnosis not present

## 2020-08-04 DIAGNOSIS — Z7902 Long term (current) use of antithrombotics/antiplatelets: Secondary | ICD-10-CM | POA: Diagnosis not present

## 2020-08-04 DIAGNOSIS — Z95810 Presence of automatic (implantable) cardiac defibrillator: Secondary | ICD-10-CM | POA: Diagnosis not present

## 2020-08-04 DIAGNOSIS — I2510 Atherosclerotic heart disease of native coronary artery without angina pectoris: Secondary | ICD-10-CM | POA: Diagnosis not present

## 2020-08-04 DIAGNOSIS — E1122 Type 2 diabetes mellitus with diabetic chronic kidney disease: Secondary | ICD-10-CM | POA: Diagnosis not present

## 2020-08-04 DIAGNOSIS — Z794 Long term (current) use of insulin: Secondary | ICD-10-CM | POA: Diagnosis not present

## 2020-08-04 DIAGNOSIS — N183 Chronic kidney disease, stage 3 unspecified: Secondary | ICD-10-CM | POA: Diagnosis not present

## 2020-08-04 DIAGNOSIS — R2689 Other abnormalities of gait and mobility: Secondary | ICD-10-CM | POA: Diagnosis not present

## 2020-08-04 DIAGNOSIS — I130 Hypertensive heart and chronic kidney disease with heart failure and stage 1 through stage 4 chronic kidney disease, or unspecified chronic kidney disease: Secondary | ICD-10-CM | POA: Diagnosis not present

## 2020-08-15 ENCOUNTER — Telehealth: Payer: Self-pay | Admitting: Emergency Medicine

## 2020-08-15 ENCOUNTER — Telehealth: Payer: Self-pay

## 2020-08-15 MED ORDER — AMIODARONE HCL 200 MG PO TABS: 200.0000 mg | ORAL_TABLET | Freq: Every day | ORAL | 3 refills | Status: AC

## 2020-08-15 NOTE — Telephone Encounter (Signed)
Referred to ICM clinic by Trinidad Curet, RN on behalf of Dr Curt Bears.   Attempted call to patient for ICM intro and voice mail box is full.

## 2020-08-15 NOTE — Telephone Encounter (Signed)
Patient 's husband requested sister [name] be notified of changes in therapy. Per Dr Curt Bears amiodorone increased to 200 mg QD . Therapy change conveyed to family and medication sent to mail order pharmacy on file. Questions answered.

## 2020-08-15 NOTE — Telephone Encounter (Signed)
-----   Message from Stanton Kidney, RN sent at 06/12/2020  5:25 PM EDT ----- Regarding: Enroll in [facility] clinic Please enroll pt in ICM clinic (I have not spoke w/ her to inform/educate her.   If you need me to call her prior to you contacting, just let me know)  Thx Sherri

## 2020-08-15 NOTE — Telephone Encounter (Signed)
Reviewed 08/15/20, normal device function. 15 episodes of NSVT from 11/27 to 08/14/20. 5 VT episodes that were successfully terminated by ATP x 1 from 07/19/20 to 08/14/20. Husband on DPR and cares for patient. He reports she has been more confused the last 7 days, no chest pain, no syncope, no c/o CP.Marland Kitchen She has been taking her amiodorone 100 mg qd and Coreg 6.25 mg BID .

## 2020-08-23 DIAGNOSIS — K219 Gastro-esophageal reflux disease without esophagitis: Secondary | ICD-10-CM | POA: Diagnosis not present

## 2020-08-23 DIAGNOSIS — R197 Diarrhea, unspecified: Secondary | ICD-10-CM | POA: Diagnosis not present

## 2020-09-05 NOTE — Telephone Encounter (Signed)
Spoke with husband per DPR and provided ICM intro. He is agreeable to monthly follow up for patient's device.  Advised monitor should be by bedside in order for it to automatically transmit a report during sleep hours of 12 midnight and 6 AM. He confirmed monitor is at bedside. Advised will receive a call after the transmission is reviewed to provide results.  Advised will schedule remote transmission for 09/25/2020 and he verbalized understanding there is nothing he needs to do at this time.

## 2020-09-16 DIAGNOSIS — K625 Hemorrhage of anus and rectum: Secondary | ICD-10-CM

## 2020-09-16 HISTORY — DX: Hemorrhage of anus and rectum: K62.5

## 2020-09-25 ENCOUNTER — Encounter: Payer: Self-pay | Admitting: Neurology

## 2020-09-25 ENCOUNTER — Ambulatory Visit: Payer: Medicare Other | Admitting: Neurology

## 2020-09-27 ENCOUNTER — Other Ambulatory Visit: Payer: Self-pay

## 2020-09-27 ENCOUNTER — Inpatient Hospital Stay (HOSPITAL_COMMUNITY)
Admission: EM | Admit: 2020-09-27 | Discharge: 2020-10-03 | DRG: 291 | Disposition: A | Discharge status: Home Health | Payer: Medicare Other | Attending: Internal Medicine | Admitting: Internal Medicine

## 2020-09-27 ENCOUNTER — Emergency Department (HOSPITAL_COMMUNITY): Payer: Medicare Other

## 2020-09-27 DIAGNOSIS — Z7902 Long term (current) use of antithrombotics/antiplatelets: Secondary | ICD-10-CM

## 2020-09-27 DIAGNOSIS — Z91013 Allergy to seafood: Secondary | ICD-10-CM

## 2020-09-27 DIAGNOSIS — I472 Ventricular tachycardia, unspecified: Secondary | ICD-10-CM

## 2020-09-27 DIAGNOSIS — Z833 Family history of diabetes mellitus: Secondary | ICD-10-CM

## 2020-09-27 DIAGNOSIS — I5043 Acute on chronic combined systolic (congestive) and diastolic (congestive) heart failure: Secondary | ICD-10-CM | POA: Diagnosis present

## 2020-09-27 DIAGNOSIS — K921 Melena: Secondary | ICD-10-CM | POA: Diagnosis not present

## 2020-09-27 DIAGNOSIS — R531 Weakness: Secondary | ICD-10-CM | POA: Diagnosis not present

## 2020-09-27 DIAGNOSIS — N184 Chronic kidney disease, stage 4 (severe): Secondary | ICD-10-CM | POA: Diagnosis not present

## 2020-09-27 DIAGNOSIS — I255 Ischemic cardiomyopathy: Secondary | ICD-10-CM | POA: Diagnosis not present

## 2020-09-27 DIAGNOSIS — R627 Adult failure to thrive: Secondary | ICD-10-CM | POA: Diagnosis present

## 2020-09-27 DIAGNOSIS — K5790 Diverticulosis of intestine, part unspecified, without perforation or abscess without bleeding: Secondary | ICD-10-CM | POA: Diagnosis present

## 2020-09-27 DIAGNOSIS — K580 Irritable bowel syndrome with diarrhea: Secondary | ICD-10-CM | POA: Diagnosis not present

## 2020-09-27 DIAGNOSIS — N183 Chronic kidney disease, stage 3 unspecified: Secondary | ICD-10-CM | POA: Diagnosis present

## 2020-09-27 DIAGNOSIS — J9811 Atelectasis: Secondary | ICD-10-CM | POA: Diagnosis not present

## 2020-09-27 DIAGNOSIS — E7800 Pure hypercholesterolemia, unspecified: Secondary | ICD-10-CM | POA: Diagnosis not present

## 2020-09-27 DIAGNOSIS — E039 Hypothyroidism, unspecified: Secondary | ICD-10-CM | POA: Diagnosis not present

## 2020-09-27 DIAGNOSIS — Z955 Presence of coronary angioplasty implant and graft: Secondary | ICD-10-CM

## 2020-09-27 DIAGNOSIS — I2510 Atherosclerotic heart disease of native coronary artery without angina pectoris: Secondary | ICD-10-CM | POA: Diagnosis present

## 2020-09-27 DIAGNOSIS — J90 Pleural effusion, not elsewhere classified: Secondary | ICD-10-CM

## 2020-09-27 DIAGNOSIS — F0390 Unspecified dementia without behavioral disturbance: Secondary | ICD-10-CM | POA: Diagnosis present

## 2020-09-27 DIAGNOSIS — R188 Other ascites: Secondary | ICD-10-CM | POA: Diagnosis not present

## 2020-09-27 DIAGNOSIS — R52 Pain, unspecified: Secondary | ICD-10-CM | POA: Diagnosis not present

## 2020-09-27 DIAGNOSIS — R54 Age-related physical debility: Secondary | ICD-10-CM | POA: Diagnosis present

## 2020-09-27 DIAGNOSIS — I5023 Acute on chronic systolic (congestive) heart failure: Secondary | ICD-10-CM | POA: Diagnosis not present

## 2020-09-27 DIAGNOSIS — I252 Old myocardial infarction: Secondary | ICD-10-CM

## 2020-09-27 DIAGNOSIS — Z95810 Presence of automatic (implantable) cardiac defibrillator: Secondary | ICD-10-CM | POA: Diagnosis not present

## 2020-09-27 DIAGNOSIS — R569 Unspecified convulsions: Secondary | ICD-10-CM | POA: Diagnosis not present

## 2020-09-27 DIAGNOSIS — N1832 Chronic kidney disease, stage 3b: Secondary | ICD-10-CM | POA: Diagnosis not present

## 2020-09-27 DIAGNOSIS — I5022 Chronic systolic (congestive) heart failure: Secondary | ICD-10-CM | POA: Diagnosis not present

## 2020-09-27 DIAGNOSIS — R0600 Dyspnea, unspecified: Secondary | ICD-10-CM | POA: Diagnosis not present

## 2020-09-27 DIAGNOSIS — R5382 Chronic fatigue, unspecified: Secondary | ICD-10-CM | POA: Diagnosis present

## 2020-09-27 DIAGNOSIS — Z8249 Family history of ischemic heart disease and other diseases of the circulatory system: Secondary | ICD-10-CM

## 2020-09-27 DIAGNOSIS — Z794 Long term (current) use of insulin: Secondary | ICD-10-CM

## 2020-09-27 DIAGNOSIS — E1122 Type 2 diabetes mellitus with diabetic chronic kidney disease: Secondary | ICD-10-CM | POA: Diagnosis present

## 2020-09-27 DIAGNOSIS — Z882 Allergy status to sulfonamides status: Secondary | ICD-10-CM

## 2020-09-27 DIAGNOSIS — Z7982 Long term (current) use of aspirin: Secondary | ICD-10-CM

## 2020-09-27 DIAGNOSIS — R12 Heartburn: Secondary | ICD-10-CM | POA: Diagnosis present

## 2020-09-27 DIAGNOSIS — Z79899 Other long term (current) drug therapy: Secondary | ICD-10-CM

## 2020-09-27 DIAGNOSIS — E785 Hyperlipidemia, unspecified: Secondary | ICD-10-CM | POA: Diagnosis not present

## 2020-09-27 DIAGNOSIS — Z20822 Contact with and (suspected) exposure to covid-19: Secondary | ICD-10-CM | POA: Diagnosis present

## 2020-09-27 DIAGNOSIS — Z79890 Hormone replacement therapy (postmenopausal): Secondary | ICD-10-CM

## 2020-09-27 DIAGNOSIS — N1831 Chronic kidney disease, stage 3a: Secondary | ICD-10-CM | POA: Diagnosis not present

## 2020-09-27 DIAGNOSIS — K760 Fatty (change of) liver, not elsewhere classified: Secondary | ICD-10-CM | POA: Diagnosis not present

## 2020-09-27 DIAGNOSIS — R1084 Generalized abdominal pain: Secondary | ICD-10-CM

## 2020-09-27 DIAGNOSIS — I130 Hypertensive heart and chronic kidney disease with heart failure and stage 1 through stage 4 chronic kidney disease, or unspecified chronic kidney disease: Secondary | ICD-10-CM | POA: Diagnosis present

## 2020-09-27 DIAGNOSIS — J9601 Acute respiratory failure with hypoxia: Secondary | ICD-10-CM | POA: Diagnosis not present

## 2020-09-27 DIAGNOSIS — R197 Diarrhea, unspecified: Secondary | ICD-10-CM | POA: Diagnosis present

## 2020-09-27 DIAGNOSIS — I501 Left ventricular failure: Secondary | ICD-10-CM | POA: Diagnosis present

## 2020-09-27 DIAGNOSIS — I517 Cardiomegaly: Secondary | ICD-10-CM | POA: Diagnosis not present

## 2020-09-27 DIAGNOSIS — K219 Gastro-esophageal reflux disease without esophagitis: Secondary | ICD-10-CM | POA: Diagnosis present

## 2020-09-27 DIAGNOSIS — I219 Acute myocardial infarction, unspecified: Secondary | ICD-10-CM | POA: Diagnosis present

## 2020-09-27 DIAGNOSIS — R0902 Hypoxemia: Secondary | ICD-10-CM | POA: Diagnosis not present

## 2020-09-27 DIAGNOSIS — E46 Unspecified protein-calorie malnutrition: Secondary | ICD-10-CM | POA: Diagnosis not present

## 2020-09-27 DIAGNOSIS — R109 Unspecified abdominal pain: Secondary | ICD-10-CM | POA: Diagnosis not present

## 2020-09-27 DIAGNOSIS — Z7189 Other specified counseling: Secondary | ICD-10-CM | POA: Diagnosis not present

## 2020-09-27 DIAGNOSIS — I509 Heart failure, unspecified: Secondary | ICD-10-CM

## 2020-09-27 DIAGNOSIS — R58 Hemorrhage, not elsewhere classified: Secondary | ICD-10-CM | POA: Diagnosis not present

## 2020-09-27 DIAGNOSIS — Z515 Encounter for palliative care: Secondary | ICD-10-CM | POA: Diagnosis not present

## 2020-09-27 LAB — CBC
HCT: 47.4 % — ABNORMAL HIGH (ref 36.0–46.0)
Hemoglobin: 14.6 g/dL (ref 12.0–15.0)
MCH: 30.5 pg (ref 26.0–34.0)
MCHC: 30.8 g/dL (ref 30.0–36.0)
MCV: 99 fL (ref 80.0–100.0)
Platelets: UNDETERMINED 10*3/uL (ref 150–400)
RBC: 4.79 MIL/uL (ref 3.87–5.11)
RDW: 17.3 % — ABNORMAL HIGH (ref 11.5–15.5)
WBC: 5.3 10*3/uL (ref 4.0–10.5)
nRBC: 0 % (ref 0.0–0.2)

## 2020-09-27 LAB — COMPREHENSIVE METABOLIC PANEL
ALT: 16 U/L (ref 0–44)
AST: 18 U/L (ref 15–41)
Albumin: 3.5 g/dL (ref 3.5–5.0)
Alkaline Phosphatase: 87 U/L (ref 38–126)
Anion gap: 16 — ABNORMAL HIGH (ref 5–15)
BUN: 20 mg/dL (ref 8–23)
CO2: 20 mmol/L — ABNORMAL LOW (ref 22–32)
Calcium: 9.3 mg/dL (ref 8.9–10.3)
Chloride: 98 mmol/L (ref 98–111)
Creatinine, Ser: 1.47 mg/dL — ABNORMAL HIGH (ref 0.44–1.00)
GFR, Estimated: 37 mL/min — ABNORMAL LOW (ref >60)
Glucose, Bld: 171 mg/dL — ABNORMAL HIGH (ref 70–99)
Potassium: 4.4 mmol/L (ref 3.5–5.1)
Sodium: 134 mmol/L — ABNORMAL LOW (ref 135–145)
Total Bilirubin: 2.9 mg/dL — ABNORMAL HIGH (ref 0.3–1.2)
Total Protein: 6.9 g/dL (ref 6.5–8.1)

## 2020-09-27 LAB — TYPE AND SCREEN
ABO/RH(D): A POS
Antibody Screen: NEGATIVE

## 2020-09-27 LAB — POC SARS CORONAVIRUS 2 AG -  ED: SARS Coronavirus 2 Ag: NEGATIVE

## 2020-09-27 LAB — BRAIN NATRIURETIC PEPTIDE: B Natriuretic Peptide: 2539.8 pg/mL — ABNORMAL HIGH (ref 0.0–100.0)

## 2020-09-27 MED ORDER — FUROSEMIDE 10 MG/ML IJ SOLN
40.0000 mg | Freq: Once | INTRAMUSCULAR | Status: AC
  Administered 2020-09-27: 40 mg via INTRAVENOUS
  Filled 2020-09-27: qty 4

## 2020-09-27 MED ORDER — SODIUM CHLORIDE 0.9 % IV BOLUS
500.0000 mL | Freq: Once | INTRAVENOUS | Status: AC
  Administered 2020-09-27: 500 mL via INTRAVENOUS

## 2020-09-27 MED ORDER — IOHEXOL 300 MG/ML  SOLN
80.0000 mL | Freq: Once | INTRAMUSCULAR | Status: AC | PRN
  Administered 2020-09-27: 75 mL via INTRAVENOUS

## 2020-09-27 MED ORDER — LOPERAMIDE HCL 2 MG PO CAPS
2.0000 mg | ORAL_CAPSULE | Freq: Once | ORAL | Status: AC
  Administered 2020-09-28: 2 mg via ORAL
  Filled 2020-09-27: qty 1

## 2020-09-27 MED ORDER — MORPHINE SULFATE (PF) 2 MG/ML IV SOLN
2.0000 mg | Freq: Once | INTRAVENOUS | Status: AC
  Administered 2020-09-27: 2 mg via INTRAVENOUS
  Filled 2020-09-27: qty 1

## 2020-09-27 NOTE — ED Provider Notes (Signed)
[facility] DEPART[facility] Provider Note   CSN: [id number] Arrival date & time: 09/27/20  1410     History Chief Complaint  Patient presents with  . Abdominal Pain  . Diarrhea  . Shortness of Breath    Jenna West is a 75 y.o. female.  HPI Patient presents with abdominal pain, diarrhea. She is here with her sister who provides much of the history. I have actually seen and evaluated this patient, took care of her within the past 6 months when she presented for new seizures. Sister notes that the patient has had episodes of diarrhea, abdominal pain going back for some time, with occasional frequency, but none recently. Now, over the past week the patient has had persistent lower abdominal pain," explosive" diarrhea, multiple times daily, and progressive weakness. Pain is sore, moderate, persistent, not improved with anything including OTC medication. Patient has been seen and evaluated by GI.  She is taking her meds as directed including pantoprazole. She was deemed inappropriate for colonoscopy.    Past Medical History:  Diagnosis Date  . Acquired hypothyroidism 05/01/2015  . AICD (automatic cardioverter/defibrillator) present   . Angina pectoris ([place]) 07/13/2015  . Aspirin long-term use 03/07/2016  . Benign essential hypertension 05/01/2015  . Bilateral carotid artery stenosis 03/07/2016  . 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 Carotid artery stenosis 06/21/2015  . Chronic systolic heart failure ([place]) 05/28/2017  . Coronary arteriosclerosis in native artery 05/01/2015   Overview:  Overview:  PCI and stent to LAD and RCA 2004 for MI DES to LAD for subtotal occlusion with no reflow 8/09  . Coronary artery disease involving native coronary artery of native heart with angina pectoris ([place]) 05/01/2015   Overview:  Overview:  PCI and stent to LAD and RCA 2004 for MI DES to LAD for subtotal occlusion with no reflow  8/09  . Coronary artery disease of native artery of native heart with stable angina pectoris ([place]) 05/01/2015   Overview:  PCI and stent to LAD and RCA 2004 for MI DES to LAD for subtotal occlusion with no reflow 2009  . Heartburn 05/29/2017  . 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 Hypertensive heart and kidney disease with HF and CKD ([place]) 05/28/2017  . ICD (implantable cardioverter-defibrillator) in place 05/01/2015   Overview:  [employer]  . Ischemic cardiomyopathy 06/04/2017  . Left heart failure ([place]) 05/01/2015  . MI (myocardial infarction) ([place]) 05/01/2015  . On amiodarone therapy 08/15/2016  . Pure hypercholesterolemia 05/01/2015  . Stage 3 chronic kidney disease ([place]) 12/18/2016  . Syncope and collapse 05/01/2015  . Type 2 diabetes mellitus with stage 3 chronic kidney disease, with long-term current use of insulin ([place]) 07/17/2016  . VT (ventricular tachycardia) ([place]) 05/01/2015    Patient Active Problem List   Diagnosis Date Noted  . Diarrhea, unspecified 06/18/2020  . Seizure ([place]) 06/18/2020  . Ischemic cardiomyopathy 06/04/2017  . Heartburn 05/29/2017  . Chronic systolic heart failure ([place]) 05/28/2017  . Hypertensive heart and kidney disease with HF and CKD ([place]) 05/28/2017  . Stage 3 chronic kidney disease ([place]) 12/18/2016  . On amiodarone therapy 08/15/2016  . Type 2 diabetes mellitus with stage 3 chronic kidney disease, with long-term current use of insulin ([place]) 07/17/2016  . Aspirin long-term use 03/07/2016  . Bilateral carotid artery stenosis 03/07/2016  . Angina pectoris ([place]) 07/13/2015  .  Carotid artery stenosis 06/21/2015  . Acquired hypothyroidism 05/01/2015  . 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 Cardiomyopathy ([place]) 05/01/2015  . Coronary artery disease involving  native coronary artery of native heart with angina pectoris ([place]) 05/01/2015  . Coronary artery disease of native artery of native heart with stable angina pectoris ([place]) 05/01/2015  . ICD (implantable cardioverter-defibrillator) in place 05/01/2015  . Left heart failure ([place]) 05/01/2015  . MI (myocardial infarction) ([place]) 05/01/2015  . Pure hypercholesterolemia 05/01/2015  . Syncope and collapse 05/01/2015  . VT (ventricular tachycardia) ([place]) 05/01/2015  . Coronary arteriosclerosis in native artery 05/01/2015    Past Surgical History:  Procedure Laterality Date  . ABDOMINAL HYSTERECTOMY    . ANKLE FRACTURE SURGERY Bilateral    6 months apart, screws  . CARDIAC CATHETERIZATION     2003, 2009, 2016  . CAROTID STENT  06/21/2015   thoracic ao, 4 vessel cerebral arteriogram & rt internal carotid artery pta/stent CPT 96045  . CATARACT EXTRACTION Right    With lens replaced  . COLONOSCOPY  2013  . CORONARY STENT PLACEMENT    . PACEMAKER PLACEMENT     has two, one working and one not, could not remove old one, Dr. Elonda Husky      OB History   No obstetric history on file.     Family History  Problem Relation Age of Onset  . Heart attack Father   . Heart disease Father   . Brain cancer Mother   . Diabetes Sister   . Breast cancer Sister   . Rectal cancer Daughter     Social History   Tobacco Use  . Smoking status: Never Smoker  . Smokeless tobacco: Never Used  Vaping Use  . Vaping Use: Never used  Substance Use Topics  . Alcohol use: No  . Drug use: No    Home Medications Prior to Admission medications   Medication Sig Start Date End Date Taking? Authorizing Provider  amiodarone (PACERONE) 200 MG tablet Take 1 tablet (200 mg total) by mouth daily. 08/15/20   Camnitz, Ocie Doyne, MD  aspirin EC 81 MG tablet Take 81 mg by mouth daily.    [provider]  atorvastatin (LIPITOR) 80 MG tablet TAKE 1 TABLET BY MOUTH  DAILY Patient taking differently: Take 80 mg by  mouth at bedtime.  05/29/20   Richardo Priest, MD  Blood Glucose Monitoring Suppl (ACCU-CHEK AVIVA PLUS) w/Device KIT Apply 1 Dose topically daily. 04/07/19   [provider]  carvedilol (COREG) 6.25 MG tablet TAKE 1 TABLET BY MOUTH  TWICE DAILY Patient taking differently: Take 6.25 mg by mouth 2 (two) times daily with a meal.  05/24/20   Camnitz, Ocie Doyne, MD  clopidogrel (PLAVIX) 75 MG tablet TAKE 1 TABLET BY MOUTH  DAILY Patient taking differently: Take 75 mg by mouth daily.  05/29/20   Richardo Priest, MD  furosemide (LASIX) 20 MG tablet TAKE 1 TABLET BY MOUTH  DAILY Patient taking differently: Take 20 mg by mouth daily.  03/28/20   Richardo Priest, MD  HUMALOG 100 UNIT/ML injection Inject 12 Units into the skin daily.  05/25/17   [provider]  insulin glargine (LANTUS) 100 UNIT/ML injection Inject 10 Units into the skin daily.    [provider]  levothyroxine (SYNTHROID, LEVOTHROID) 100 MCG tablet Take 100 mcg by mouth daily before breakfast.  07/02/17   [provider]  nitroGLYCERIN (NITROLINGUAL) 0.4 MG/SPRAY spray Place 1 spray under the tongue every 5 (five) minutes x 3 doses as needed for chest pain. 04/24/20   Richardo Priest, MD  ranolazine (RANEXA) 500 MG 12 hr tablet Take 500 mg by mouth daily.  11/07/15   [provider]  sacubitril-valsartan (ENTRESTO) 24-26 MG Take 1 tablet by mouth 2 (two) times daily. 11/22/19   Camnitz, Ocie Doyne, MD  sertraline (ZOLOFT) 50 MG tablet Take 50 mg by mouth daily. 10/24/18   [provider]  spironolactone (ALDACTONE) 25 MG tablet TAKE ONE-HALF TABLET BY  MOUTH DAILY Patient taking differently: Take 12.5 mg by mouth every other day.  05/29/20   Richardo Priest, MD  Vitamin D, Ergocalciferol, (DRISDOL) 1.25 MG (50000 UT) CAPS capsule Take 50,000 Units by mouth every Wednesday.     [provider]  zolpidem (AMBIEN) 10 MG tablet Take 10 mg by mouth at bedtime. 07/31/19   [provider]     Allergies    Shellfish allergy and Sulfa antibiotics  Review of Systems   Review of Systems  Constitutional:       Per HPI, otherwise negative  HENT:       Per HPI, otherwise negative  Respiratory:       Per HPI, otherwise negative  Cardiovascular:       Per HPI, otherwise negative  Gastrointestinal: Positive for abdominal pain, diarrhea and nausea.  Endocrine:       Negative aside from HPI  Genitourinary:       Neg aside from HPI   Musculoskeletal:       Per HPI, otherwise negative  Skin: Negative.   Neurological: Positive for weakness. Negative for syncope.    Physical Exam Updated Vital Signs BP 131/72   Pulse 72   Temp 98.4 F (36.9 C) (Oral)   Resp (!) 23   SpO2 97%   Physical Exam Vitals and nursing note reviewed.  Constitutional:      General: She is not in acute distress.    Appearance: She is well-developed and well-nourished. She is ill-appearing.  HENT:     Head: Normocephalic and atraumatic.  Eyes:     Extraocular Movements: EOM normal.     Conjunctiva/sclera: Conjunctivae normal.  Cardiovascular:     Rate and Rhythm: Normal rate and regular rhythm.  Pulmonary:     Effort: Tachypnea and accessory muscle usage present. No respiratory distress.     Breath sounds: Decreased air movement present.  Abdominal:     General: There is no distension.     Tenderness: There is abdominal tenderness. There is guarding.  Musculoskeletal:        General: No edema.  Skin:    General: Skin is warm and dry.  Neurological:     Mental Status: She is alert and oriented to person, place, and time.     Cranial Nerves: No cranial nerve deficit.  Psychiatric:        Mood and Affect: Mood and affect normal.     ED Results / Procedures / Treatments   Labs (all labs ordered are listed, but only abnormal results are displayed) Labs Reviewed  COMPREHENSIVE METABOLIC PANEL - Abnormal; Notable for the following components:      Result Value   Sodium 134 (*)     CO2 20 (*)    Glucose, Bld 171 (*)    Creatinine, Ser 1.47 (*)    Total Bilirubin 2.9 (*)    GFR,  Estimated 37 (*)    Anion gap 16 (*)    All other components within normal limits  CBC - Abnormal; Notable for the following components:   HCT 47.4 (*)    RDW 17.3 (*)    All other components within normal limits  BRAIN NATRIURETIC PEPTIDE - Abnormal; Notable for the following components:   B Natriuretic Peptide 2,539.8 (*)    All other components within normal limits  POC SARS CORONAVIRUS 2 AG -  ED  POC OCCULT BLOOD, ED  TYPE AND SCREEN  ABO/RH    EKG None  Radiology CT Abdomen Pelvis W Contrast  Result Date: 09/27/2020 CLINICAL DATA:  Abdominal pain with rectal bleeding EXAM: CT ABDOMEN AND PELVIS WITH CONTRAST TECHNIQUE: Multidetector CT imaging of the abdomen and pelvis was performed using the standard protocol following bolus administration of intravenous contrast. CONTRAST:  58m OMNIPAQUE IOHEXOL 300 MG/ML  SOLN COMPARISON:  03/29/2020 FINDINGS: LOWER CHEST: Large pleural effusions. HEPATOBILIARY: Large amount of ascites in the upper abdomen. No focal hepatic lesion. There is cholelithiasis without acute inflammation. PANCREAS: Normal pancreas. No ductal dilatation or peripancreatic fluid collection. SPLEEN: Normal. ADRENALS/URINARY TRACT: The adrenal glands are normal. No hydronephrosis, nephroureterolithiasis or solid renal mass. The urinary bladder is normal for degree of distention STOMACH/BOWEL: There is no hiatal hernia. Normal duodenal course and caliber. No small bowel dilatation or inflammation. Rectosigmoid diverticulosis without acute inflammation. Normal appendix. VASCULAR/LYMPHATIC: There is calcific atherosclerosis of the abdominal aorta. No lymphadenopathy. REPRODUCTIVE: Status post hysterectomy. No adnexal mass. MUSCULOSKELETAL. Multilevel degenerative disc disease and facet arthrosis. No bony spinal canal stenosis. OTHER: Anasarca IMPRESSION: 1. Upper abdominal ascites,  anasarca and large pleural effusions, favoring volume overload. 2. Diverticulosis without acute inflammation. Aortic Atherosclerosis (ICD10-I70.0). Electronically Signed   By: KUlyses JarredM.D.   On: 09/27/2020 22:12   DG Chest Port 1 View  Result Date: 09/27/2020 CLINICAL DATA:  Diarrhea EXAM: PORTABLE CHEST 1 VIEW COMPARISON:  06/18/2020, 11/14/2017 FINDINGS: Partially visualized sclerotic lesion in the left humerus. Left-sided pacing device as before. Small moderate bilateral pleural effusions and basilar airspace disease. Cardiomegaly with central vascular congestion. Calcified left ventricular infarct. No pneumothorax IMPRESSION: 1. Cardiomegaly with vascular congestion and small to moderate bilateral effusions. 2. Basilar airspace disease, atelectasis versus pneumonia 3. Curvilinear calcification over left ventricular apex consistent with calcified infarct Electronically Signed   By: KDonavan FoilM.D.   On: 09/27/2020 20:29    Procedures Procedures (including critical care time)  Medications Ordered in ED Medications  sodium chloride 0.9 % bolus 500 mL (0 mLs Intravenous Stopped 09/27/20 2303)  morphine 2 MG/ML injection 2 mg (2 mg Intravenous Given 09/27/20 2113)  iohexol (OMNIPAQUE) 300 MG/ML solution 80 mL (75 mLs Intravenous Contrast Given 09/27/20 2150)  furosemide (LASIX) injection 40 mg (40 mg Intravenous Given 09/27/20 2304)    ED Course  I have reviewed the triage vital signs and the nursing notes.  Pertinent labs & imaging results that were available during my care of the patient were reviewed by me and considered in my medical decision making (see chart for details).   Chart review notable for echocardiogram, 2020, EF less than 278%with diastolic dysfunction, systolic dysfunction. MDM Rules/Calculators/A&P                          11:27 PM Have reviewed the patient's x-ray, CT, labs, agree with interpretation. I discussed these with the patient's daughter who is a  nMarine scientist On reexam  the patient remains tachypneic, tachycardic, and we lengthy conversation about the findings CT abdomen generally reassuring in terms of acute abdominal processes, but consistent with the patient's x-ray demonstrating bilateral pleural effusions, and anasarca. BNP substantially elevated.  Patient notes that she is taking her Lasix as directed, but given her substantial elevation, patient will require IV Lasix, 40 mg provided. With consideration of heart failure exacerbation she will require admission for ongoing monitoring, management.  Initial COVID test negative.  Final Clinical Impression(s) / ED Diagnoses Final diagnoses:  Acute on chronic combined systolic and diastolic congestive heart failure ([place])  Diarrhea, unspecified type  Generalized abdominal pain     Carmin Muskrat, MD 09/27/20 2331

## 2020-09-27 NOTE — ED Triage Notes (Signed)
Pt with abdominal pain x 1 month and rectal bleeding x 5 days. Pt endorses generalized fatigue. Pt on Plavix.

## 2020-09-28 ENCOUNTER — Inpatient Hospital Stay (HOSPITAL_COMMUNITY): Payer: Medicare Other

## 2020-09-28 DIAGNOSIS — E1122 Type 2 diabetes mellitus with diabetic chronic kidney disease: Secondary | ICD-10-CM | POA: Diagnosis present

## 2020-09-28 DIAGNOSIS — E785 Hyperlipidemia, unspecified: Secondary | ICD-10-CM | POA: Diagnosis present

## 2020-09-28 DIAGNOSIS — R627 Adult failure to thrive: Secondary | ICD-10-CM | POA: Diagnosis present

## 2020-09-28 DIAGNOSIS — I5043 Acute on chronic combined systolic (congestive) and diastolic (congestive) heart failure: Secondary | ICD-10-CM | POA: Diagnosis present

## 2020-09-28 DIAGNOSIS — I5023 Acute on chronic systolic (congestive) heart failure: Secondary | ICD-10-CM | POA: Diagnosis not present

## 2020-09-28 DIAGNOSIS — K580 Irritable bowel syndrome with diarrhea: Secondary | ICD-10-CM | POA: Diagnosis present

## 2020-09-28 DIAGNOSIS — K219 Gastro-esophageal reflux disease without esophagitis: Secondary | ICD-10-CM | POA: Diagnosis present

## 2020-09-28 DIAGNOSIS — I130 Hypertensive heart and chronic kidney disease with heart failure and stage 1 through stage 4 chronic kidney disease, or unspecified chronic kidney disease: Secondary | ICD-10-CM | POA: Diagnosis not present

## 2020-09-28 DIAGNOSIS — I5022 Chronic systolic (congestive) heart failure: Secondary | ICD-10-CM | POA: Diagnosis not present

## 2020-09-28 DIAGNOSIS — Z7189 Other specified counseling: Secondary | ICD-10-CM | POA: Diagnosis not present

## 2020-09-28 DIAGNOSIS — N1831 Chronic kidney disease, stage 3a: Secondary | ICD-10-CM | POA: Diagnosis not present

## 2020-09-28 DIAGNOSIS — E46 Unspecified protein-calorie malnutrition: Secondary | ICD-10-CM | POA: Diagnosis present

## 2020-09-28 DIAGNOSIS — R5382 Chronic fatigue, unspecified: Secondary | ICD-10-CM | POA: Diagnosis present

## 2020-09-28 DIAGNOSIS — J90 Pleural effusion, not elsewhere classified: Secondary | ICD-10-CM | POA: Diagnosis not present

## 2020-09-28 DIAGNOSIS — I472 Ventricular tachycardia: Secondary | ICD-10-CM | POA: Diagnosis present

## 2020-09-28 DIAGNOSIS — I509 Heart failure, unspecified: Secondary | ICD-10-CM

## 2020-09-28 DIAGNOSIS — R1084 Generalized abdominal pain: Secondary | ICD-10-CM | POA: Diagnosis present

## 2020-09-28 DIAGNOSIS — E7800 Pure hypercholesterolemia, unspecified: Secondary | ICD-10-CM | POA: Diagnosis present

## 2020-09-28 DIAGNOSIS — I255 Ischemic cardiomyopathy: Secondary | ICD-10-CM | POA: Diagnosis present

## 2020-09-28 DIAGNOSIS — R531 Weakness: Secondary | ICD-10-CM | POA: Diagnosis not present

## 2020-09-28 DIAGNOSIS — N1832 Chronic kidney disease, stage 3b: Secondary | ICD-10-CM | POA: Diagnosis not present

## 2020-09-28 DIAGNOSIS — Z20822 Contact with and (suspected) exposure to covid-19: Secondary | ICD-10-CM | POA: Diagnosis present

## 2020-09-28 DIAGNOSIS — F0390 Unspecified dementia without behavioral disturbance: Secondary | ICD-10-CM | POA: Diagnosis present

## 2020-09-28 DIAGNOSIS — K5790 Diverticulosis of intestine, part unspecified, without perforation or abscess without bleeding: Secondary | ICD-10-CM | POA: Diagnosis present

## 2020-09-28 DIAGNOSIS — E039 Hypothyroidism, unspecified: Secondary | ICD-10-CM | POA: Diagnosis present

## 2020-09-28 DIAGNOSIS — I2510 Atherosclerotic heart disease of native coronary artery without angina pectoris: Secondary | ICD-10-CM | POA: Diagnosis present

## 2020-09-28 DIAGNOSIS — Z95810 Presence of automatic (implantable) cardiac defibrillator: Secondary | ICD-10-CM | POA: Diagnosis not present

## 2020-09-28 DIAGNOSIS — K921 Melena: Secondary | ICD-10-CM | POA: Diagnosis present

## 2020-09-28 DIAGNOSIS — I252 Old myocardial infarction: Secondary | ICD-10-CM | POA: Diagnosis not present

## 2020-09-28 DIAGNOSIS — Z7902 Long term (current) use of antithrombotics/antiplatelets: Secondary | ICD-10-CM | POA: Diagnosis not present

## 2020-09-28 DIAGNOSIS — N184 Chronic kidney disease, stage 4 (severe): Secondary | ICD-10-CM | POA: Diagnosis not present

## 2020-09-28 DIAGNOSIS — J9811 Atelectasis: Secondary | ICD-10-CM | POA: Diagnosis not present

## 2020-09-28 DIAGNOSIS — J9601 Acute respiratory failure with hypoxia: Secondary | ICD-10-CM | POA: Diagnosis present

## 2020-09-28 DIAGNOSIS — R54 Age-related physical debility: Secondary | ICD-10-CM | POA: Diagnosis present

## 2020-09-28 LAB — COMPREHENSIVE METABOLIC PANEL
ALT: 12 U/L (ref 0–44)
AST: 15 U/L (ref 15–41)
Albumin: 2.8 g/dL — ABNORMAL LOW (ref 3.5–5.0)
Alkaline Phosphatase: 67 U/L (ref 38–126)
Anion gap: 10 (ref 5–15)
BUN: 16 mg/dL (ref 8–23)
CO2: 24 mmol/L (ref 22–32)
Calcium: 8.4 mg/dL — ABNORMAL LOW (ref 8.9–10.3)
Chloride: 101 mmol/L (ref 98–111)
Creatinine, Ser: 1.46 mg/dL — ABNORMAL HIGH (ref 0.44–1.00)
GFR, Estimated: 38 mL/min — ABNORMAL LOW (ref >60)
Glucose, Bld: 139 mg/dL — ABNORMAL HIGH (ref 70–99)
Potassium: 3.9 mmol/L (ref 3.5–5.1)
Sodium: 135 mmol/L (ref 135–145)
Total Bilirubin: 2 mg/dL — ABNORMAL HIGH (ref 0.3–1.2)
Total Protein: 5.7 g/dL — ABNORMAL LOW (ref 6.5–8.1)

## 2020-09-28 LAB — GLUCOSE, CAPILLARY
Glucose-Capillary: 136 mg/dL — ABNORMAL HIGH (ref 70–99)
Glucose-Capillary: 51 mg/dL — ABNORMAL LOW (ref 70–99)
Glucose-Capillary: 70 mg/dL (ref 70–99)
Glucose-Capillary: 67 mg/dL — ABNORMAL LOW (ref 70–99)
Glucose-Capillary: 85 mg/dL (ref 70–99)

## 2020-09-28 LAB — ECHOCARDIOGRAM COMPLETE
Area-P 1/2: 8.92 cm2
S' Lateral: 5.3 cm

## 2020-09-28 LAB — CBC
HCT: 42.1 % (ref 36.0–46.0)
Hemoglobin: 12.8 g/dL (ref 12.0–15.0)
MCH: 29.8 pg (ref 26.0–34.0)
MCHC: 30.4 g/dL (ref 30.0–36.0)
MCV: 97.9 fL (ref 80.0–100.0)
Platelets: 94 10*3/uL — ABNORMAL LOW (ref 150–400)
RBC: 4.3 MIL/uL (ref 3.87–5.11)
RDW: 17.2 % — ABNORMAL HIGH (ref 11.5–15.5)
WBC: 4.2 10*3/uL (ref 4.0–10.5)
nRBC: 0 % (ref 0.0–0.2)

## 2020-09-28 LAB — CBG MONITORING, ED
Glucose-Capillary: 121 mg/dL — ABNORMAL HIGH (ref 70–99)
Glucose-Capillary: 158 mg/dL — ABNORMAL HIGH (ref 70–99)

## 2020-09-28 LAB — ABO/RH: ABO/RH(D): A POS

## 2020-09-28 MED ORDER — CLOPIDOGREL BISULFATE 75 MG PO TABS
75.0000 mg | ORAL_TABLET | Freq: Every day | ORAL | Status: DC
  Administered 2020-09-28 – 2020-10-03 (×6): 75 mg via ORAL
  Filled 2020-09-28 (×6): qty 1

## 2020-09-28 MED ORDER — INSULIN ASPART 100 UNIT/ML ~~LOC~~ SOLN
0.0000 [IU] | Freq: Three times a day (TID) | SUBCUTANEOUS | Status: DC
  Administered 2020-09-28 (×2): 2 [IU] via SUBCUTANEOUS
  Administered 2020-09-29 – 2020-09-30 (×2): 3 [IU] via SUBCUTANEOUS
  Administered 2020-10-01: 5 [IU] via SUBCUTANEOUS
  Administered 2020-10-02 – 2020-10-03 (×3): 2 [IU] via SUBCUTANEOUS

## 2020-09-28 MED ORDER — RANOLAZINE ER 500 MG PO TB12
500.0000 mg | ORAL_TABLET | Freq: Every day | ORAL | Status: DC
  Administered 2020-09-28 – 2020-10-03 (×6): 500 mg via ORAL
  Filled 2020-09-28 (×6): qty 1

## 2020-09-28 MED ORDER — SERTRALINE HCL 50 MG PO TABS
50.0000 mg | ORAL_TABLET | Freq: Every day | ORAL | Status: DC
  Administered 2020-09-28 – 2020-10-03 (×6): 50 mg via ORAL
  Filled 2020-09-28 (×6): qty 1

## 2020-09-28 MED ORDER — POLYETHYLENE GLYCOL 3350 17 G PO PACK: 17.0000 g | PACK | Freq: Every day | ORAL | Status: DC | PRN

## 2020-09-28 MED ORDER — ASPIRIN EC 81 MG PO TBEC
81.0000 mg | DELAYED_RELEASE_TABLET | Freq: Every day | ORAL | Status: DC
  Administered 2020-09-28 – 2020-10-03 (×6): 81 mg via ORAL
  Filled 2020-09-28 (×6): qty 1

## 2020-09-28 MED ORDER — LEVOTHYROXINE SODIUM 100 MCG PO TABS
100.0000 ug | ORAL_TABLET | Freq: Every day | ORAL | Status: DC
  Administered 2020-09-28 – 2020-10-03 (×6): 100 ug via ORAL
  Filled 2020-09-28 (×6): qty 1

## 2020-09-28 MED ORDER — INSULIN GLARGINE 100 UNIT/ML ~~LOC~~ SOLN
7.0000 [IU] | Freq: Every day | SUBCUTANEOUS | Status: DC
  Administered 2020-09-28: 7 [IU] via SUBCUTANEOUS
  Filled 2020-09-28 (×2): qty 0.07

## 2020-09-28 MED ORDER — NITROGLYCERIN 0.4 MG SL SUBL: 0.4000 mg | SUBLINGUAL_TABLET | SUBLINGUAL | Status: DC | PRN

## 2020-09-28 MED ORDER — DEXTROSE 50 % IV SOLN: 25.0000 mL | Freq: Four times a day (QID) | INTRAVENOUS | Status: DC | PRN

## 2020-09-28 MED ORDER — ACETAMINOPHEN 325 MG PO TABS
650.0000 mg | ORAL_TABLET | Freq: Four times a day (QID) | ORAL | Status: DC | PRN
  Administered 2020-09-30 – 2020-10-02 (×3): 650 mg via ORAL
  Filled 2020-09-28 (×4): qty 2

## 2020-09-28 MED ORDER — ATORVASTATIN CALCIUM 80 MG PO TABS
80.0000 mg | ORAL_TABLET | Freq: Every day | ORAL | Status: DC
  Administered 2020-09-28 – 2020-10-02 (×5): 80 mg via ORAL
  Filled 2020-09-28 (×5): qty 1

## 2020-09-28 MED ORDER — SODIUM CHLORIDE 0.9% FLUSH
3.0000 mL | Freq: Two times a day (BID) | INTRAVENOUS | Status: DC
  Administered 2020-09-28 – 2020-10-03 (×12): 3 mL via INTRAVENOUS

## 2020-09-28 MED ORDER — CARVEDILOL 6.25 MG PO TABS
6.2500 mg | ORAL_TABLET | Freq: Two times a day (BID) | ORAL | Status: DC
  Administered 2020-09-28 – 2020-10-03 (×8): 6.25 mg via ORAL
  Filled 2020-09-28 (×7): qty 1
  Filled 2020-09-28: qty 2
  Filled 2020-09-28: qty 1

## 2020-09-28 MED ORDER — SPIRONOLACTONE 12.5 MG HALF TABLET
12.5000 mg | ORAL_TABLET | ORAL | Status: DC
  Administered 2020-09-28: 12.5 mg via ORAL
  Filled 2020-09-28 (×2): qty 1

## 2020-09-28 MED ORDER — PERFLUTREN LIPID MICROSPHERE
1.0000 mL | INTRAVENOUS | Status: AC | PRN
  Administered 2020-09-28: 2 mL via INTRAVENOUS
  Filled 2020-09-28: qty 10

## 2020-09-28 MED ORDER — ZOLPIDEM TARTRATE 5 MG PO TABS
5.0000 mg | ORAL_TABLET | Freq: Every day | ORAL | Status: DC
  Administered 2020-09-28 – 2020-10-02 (×6): 5 mg via ORAL
  Filled 2020-09-28 (×6): qty 1

## 2020-09-28 MED ORDER — ACETAMINOPHEN 650 MG RE SUPP: 650.0000 mg | Freq: Four times a day (QID) | RECTAL | Status: DC | PRN

## 2020-09-28 MED ORDER — SACUBITRIL-VALSARTAN 24-26 MG PO TABS
1.0000 | ORAL_TABLET | Freq: Two times a day (BID) | ORAL | Status: DC
  Administered 2020-09-28 – 2020-09-30 (×5): 1 via ORAL
  Filled 2020-09-28 (×5): qty 1

## 2020-09-28 MED ORDER — FUROSEMIDE 10 MG/ML IJ SOLN
40.0000 mg | Freq: Two times a day (BID) | INTRAMUSCULAR | Status: DC
  Administered 2020-09-28 – 2020-09-29 (×2): 40 mg via INTRAVENOUS
  Filled 2020-09-28 (×2): qty 4

## 2020-09-28 MED ORDER — AMIODARONE HCL 200 MG PO TABS
200.0000 mg | ORAL_TABLET | Freq: Every day | ORAL | Status: DC
  Administered 2020-09-28 – 2020-10-03 (×6): 200 mg via ORAL
  Filled 2020-09-28 (×6): qty 1

## 2020-09-28 MED ORDER — INSULIN ASPART 100 UNIT/ML ~~LOC~~ SOLN: 0.0000 [IU] | Freq: Every day | SUBCUTANEOUS | Status: DC

## 2020-09-28 NOTE — Progress Notes (Signed)
  Echocardiogram 2D Echocardiogram has been performed.  Jenna West 09/28/2020, 10:22 AM

## 2020-09-28 NOTE — H&P (Addendum)
History and Physical   Jenna West GBE:[id number] DOB: Feb 24, 1946 DOA: 09/27/2020  PCP: Ronita Hipps, MD   Patient coming from: Home  Chief Complaint: Fatigue, abdominal pain, blood in stools  HPI: Jenna West is a 75 y.o. female with medical history significant of hypothyroidism, carotid artery disease, systolic heart failure, CAD, GERD, hypertension, seizure, CKD 3, diabetes, hyperlipidemia, IBS who presents with abdominal pain, rectal bleeding and fatigue.  Patient reports 1 month of abdominal pain, 5 days of bloody stools and some ongoing fatigue.  She states she has chronic fatigue but the fatigue has been worse recently.  She also reports some worsening normal diarrhea for the last couple days.  Upon further questioning she also endorses some shortness of breath, orthopnea and has had some increased lower extremity edema.  Her sister accompanies her and states that she does have intermittent episodes of confusion of unclear etiology.  She has been taking all her medications as prescribed other than taking her spironolactone every other day instead of daily.  No change in diet.  Patient denies fever, chills, cough.  ED Course: Vitals in ED significant for intermittent tachypnea in the low 20s.  Lab work-up showed BMP with sodium 134, bicarb 20, gap 16, creatinine 1.47 which is stable, glucose 171.  LFTs showed T bili and at 2.9.  CBC was normal other than clumps for platelets which need repeating.  BNP 2539.  Respiratory panel for flu and COVID-negative.  Type and screen performed.  FOBT pending.  Imaging showed chest x-ray with cardiomegaly vascular congestion and small to medium moderate effusions as well as basilar atelectasis versus less likely pneumonia.  CT abdomen pelvis showed no intra-abdominal abnormality other than ascites also demonstrated anasarca and large pleural effusions.  Patient started on IV Lasix in ED.  Review of Systems: As per HPI otherwise all other systems  reviewed and are negative.  Past Medical History:  Diagnosis Date  . Acquired hypothyroidism 05/01/2015  . AICD (automatic cardioverter/defibrillator) present   . Angina pectoris ([place]) 07/13/2015  . Aspirin long-term use 03/07/2016  . Benign essential hypertension 05/01/2015  . Bilateral carotid artery stenosis 03/07/2016  . 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 Carotid artery stenosis 06/21/2015  . Chronic systolic heart failure ([place]) 05/28/2017  . Coronary arteriosclerosis in native artery 05/01/2015   Overview:  Overview:  PCI and stent to LAD and RCA 2004 for MI DES to LAD for subtotal occlusion with no reflow 8/09  . Coronary artery disease involving native coronary artery of native heart with angina pectoris ([place]) 05/01/2015   Overview:  Overview:  PCI and stent to LAD and RCA 2004 for MI DES to LAD for subtotal occlusion with no reflow 8/09  . Coronary artery disease of native artery of native heart with stable angina pectoris ([place]) 05/01/2015   Overview:  PCI and stent to LAD and RCA 2004 for MI DES to LAD for subtotal occlusion with no reflow 2009  . Heartburn 05/29/2017  . 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 Hypertensive heart and kidney disease with HF and CKD ([place]) 05/28/2017  . ICD (implantable cardioverter-defibrillator) in place 05/01/2015   Overview:  [employer]  . Ischemic cardiomyopathy 06/04/2017  . Left heart failure ([place]) 05/01/2015  . MI (myocardial infarction) ([place]) 05/01/2015  . On amiodarone therapy 08/15/2016  . Pure hypercholesterolemia 05/01/2015  .  Stage 3 chronic kidney disease ([place]) 12/18/2016  . Syncope and collapse 05/01/2015  . Type 2 diabetes mellitus with stage 3 chronic kidney disease, with long-term current use of insulin ([place]) 07/17/2016  . VT (ventricular tachycardia) ([place]) 05/01/2015    Past Surgical History:   Procedure Laterality Date  . ABDOMINAL HYSTERECTOMY    . ANKLE FRACTURE SURGERY Bilateral    6 months apart, screws  . CARDIAC CATHETERIZATION     2003, 2009, 2016  . CAROTID STENT  06/21/2015   thoracic ao, 4 vessel cerebral arteriogram & rt internal carotid artery pta/stent CPT 72620  . CATARACT EXTRACTION Right    With lens replaced  . COLONOSCOPY  2013  . CORONARY STENT PLACEMENT    . PACEMAKER PLACEMENT     has two, one working and one not, could not remove old one, Dr. Elonda Husky     Social History  reports that she has never smoked. She has never used smokeless tobacco. She reports that she does not drink alcohol and does not use drugs.  Allergies  Allergen Reactions  . Shellfish Allergy Swelling    Of mouth only  . Sulfa Antibiotics Other (See Comments)    Internal bleeding    Family History  Problem Relation Age of Onset  . Heart attack Father   . Heart disease Father   . Brain cancer Mother   . Diabetes Sister   . Breast cancer Sister   . Rectal cancer Daughter   Reviewed on admission  Prior to Admission medications   Medication Sig Start Date End Date Taking? Authorizing Provider  amiodarone (PACERONE) 200 MG tablet Take 1 tablet (200 mg total) by mouth daily. 08/15/20   Camnitz, Ocie Doyne, MD  aspirin EC 81 MG tablet Take 81 mg by mouth daily.    [provider]  atorvastatin (LIPITOR) 80 MG tablet TAKE 1 TABLET BY MOUTH  DAILY Patient taking differently: Take 80 mg by mouth at bedtime.  05/29/20   Richardo Priest, MD  Blood Glucose Monitoring Suppl (ACCU-CHEK AVIVA PLUS) w/Device KIT Apply 1 Dose topically daily. 04/07/19   [provider]  carvedilol (COREG) 6.25 MG tablet TAKE 1 TABLET BY MOUTH  TWICE DAILY Patient taking differently: Take 6.25 mg by mouth 2 (two) times daily with a meal.  05/24/20   Camnitz, Ocie Doyne, MD  clopidogrel (PLAVIX) 75 MG tablet TAKE 1 TABLET BY MOUTH  DAILY Patient taking differently: Take 75 mg by mouth daily.   05/29/20   Richardo Priest, MD  furosemide (LASIX) 20 MG tablet TAKE 1 TABLET BY MOUTH  DAILY Patient taking differently: Take 20 mg by mouth daily.  03/28/20   Richardo Priest, MD  HUMALOG 100 UNIT/ML injection Inject 12 Units into the skin daily.  05/25/17   [provider]  insulin glargine (LANTUS) 100 UNIT/ML injection Inject 10 Units into the skin daily.    [provider]  levothyroxine (SYNTHROID, LEVOTHROID) 100 MCG tablet Take 100 mcg by mouth daily before breakfast.  07/02/17   [provider]  nitroGLYCERIN (NITROLINGUAL) 0.4 MG/SPRAY spray Place 1 spray under the tongue every 5 (five) minutes x 3 doses as needed for chest pain. 04/24/20   Richardo Priest, MD  ranolazine (RANEXA) 500 MG 12 hr tablet Take 500 mg by mouth daily.  11/07/15   [provider]  sacubitril-valsartan (ENTRESTO) 24-26 MG Take 1 tablet by mouth 2 (two) times daily. 11/22/19   Camnitz, Ocie Doyne, MD  sertraline (  ZOLOFT) 50 MG tablet Take 50 mg by mouth daily. 10/24/18   [provider]  spironolactone (ALDACTONE) 25 MG tablet TAKE ONE-HALF TABLET BY  MOUTH DAILY Patient taking differently: Take 12.5 mg by mouth every other day.  05/29/20   Richardo Priest, MD  Vitamin D, Ergocalciferol, (DRISDOL) 1.25 MG (50000 UT) CAPS capsule Take 50,000 Units by mouth every Wednesday.     [provider]  zolpidem (AMBIEN) 10 MG tablet Take 10 mg by mouth at bedtime. 07/31/19   [provider]    Physical Exam: Vitals:   09/27/20 2304 09/27/20 2307 09/27/20 2308 09/28/20 0030  BP: 131/72   126/73  Pulse:  73 72 69  Resp: (!) 24 (!) 25 (!) 23 18  Temp:      TempSrc:      SpO2:  95% 97% 97%   Physical Exam Constitutional:      General: She is not in acute distress.    Appearance: Normal appearance.  HENT:     Head: Normocephalic and atraumatic.     Mouth/Throat:     Mouth: Mucous membranes are moist.     Pharynx: Oropharynx is clear.  Eyes:     Extraocular  Movements: Extraocular movements intact.     Pupils: Pupils are equal, round, and reactive to light.  Cardiovascular:     Rate and Rhythm: Normal rate and regular rhythm.     Pulses: Normal pulses.     Heart sounds: Normal heart sounds.  Pulmonary:     Effort: Pulmonary effort is normal. No respiratory distress.     Breath sounds: Rales present.     Comments: Diminished breath sounds at bases Abdominal:     General: Bowel sounds are normal. There is no distension.     Palpations: Abdomen is soft.     Tenderness: There is no abdominal tenderness.  Musculoskeletal:        General: No swelling or deformity.     Right lower leg: Edema present.     Left lower leg: Edema present.  Skin:    General: Skin is warm and dry.  Neurological:     General: No focal deficit present.     Mental Status: Mental status is at baseline.    Labs on Admission: I have personally reviewed following labs and imaging studies  CBC: Recent Labs  Lab 09/27/20 1448  WBC 5.3  HGB 14.6  HCT 47.4*  MCV 99.0  PLT PLATELET CLUMPS NOTED ON SMEAR, UNABLE TO ESTIMATE    Basic Metabolic Panel: Recent Labs  Lab 09/27/20 1448  NA 134*  K 4.4  CL 98  CO2 20*  GLUCOSE 171*  BUN 20  CREATININE 1.47*  CALCIUM 9.3    GFR: CrCl cannot be calculated (Unknown ideal weight.).  Liver Function Tests: Recent Labs  Lab 09/27/20 1448  AST 18  ALT 16  ALKPHOS 87  BILITOT 2.9*  PROT 6.9  ALBUMIN 3.5    Urine analysis:    Component Value Date/Time   COLORURINE YELLOW 06/18/2020 1919   APPEARANCEUR CLEAR 06/18/2020 1919   LABSPEC 1.011 06/18/2020 1919   PHURINE 6.0 06/18/2020 1919   GLUCOSEU NEGATIVE 06/18/2020 1919   HGBUR SMALL (A) 06/18/2020 1919   BILIRUBINUR NEGATIVE 06/18/2020 [place] NEGATIVE 06/18/2020 1919   PROTEINUR 30 (A) 06/18/2020 1919   NITRITE NEGATIVE 06/18/2020 1919   LEUKOCYTESUR TRACE (A) 06/18/2020 1919    Radiological Exams on Admission: CT Abdomen Pelvis W  Contrast  Result Date: 09/27/2020 CLINICAL DATA:  Abdominal pain with rectal bleeding EXAM: CT ABDOMEN AND PELVIS WITH CONTRAST TECHNIQUE: Multidetector CT imaging of the abdomen and pelvis was performed using the standard protocol following bolus administration of intravenous contrast. CONTRAST:  21m OMNIPAQUE IOHEXOL 300 MG/ML  SOLN COMPARISON:  03/29/2020 FINDINGS: LOWER CHEST: Large pleural effusions. HEPATOBILIARY: Large amount of ascites in the upper abdomen. No focal hepatic lesion. There is cholelithiasis without acute inflammation. PANCREAS: Normal pancreas. No ductal dilatation or peripancreatic fluid collection. SPLEEN: Normal. ADRENALS/URINARY TRACT: The adrenal glands are normal. No hydronephrosis, nephroureterolithiasis or solid renal mass. The urinary bladder is normal for degree of distention STOMACH/BOWEL: There is no hiatal hernia. Normal duodenal course and caliber. No small bowel dilatation or inflammation. Rectosigmoid diverticulosis without acute inflammation. Normal appendix. VASCULAR/LYMPHATIC: There is calcific atherosclerosis of the abdominal aorta. No lymphadenopathy. REPRODUCTIVE: Status post hysterectomy. No adnexal mass. MUSCULOSKELETAL. Multilevel degenerative disc disease and facet arthrosis. No bony spinal canal stenosis. OTHER: Anasarca IMPRESSION: 1. Upper abdominal ascites, anasarca and large pleural effusions, favoring volume overload. 2. Diverticulosis without acute inflammation. Aortic Atherosclerosis (ICD10-I70.0). Electronically Signed   By: KUlyses JarredM.D.   On: 09/27/2020 22:12   DG Chest Port 1 View  Result Date: 09/27/2020 CLINICAL DATA:  Diarrhea EXAM: PORTABLE CHEST 1 VIEW COMPARISON:  06/18/2020, 11/14/2017 FINDINGS: Partially visualized sclerotic lesion in the left humerus. Left-sided pacing device as before. Small moderate bilateral pleural effusions and basilar airspace disease. Cardiomegaly with central vascular congestion. Calcified left ventricular  infarct. No pneumothorax IMPRESSION: 1. Cardiomegaly with vascular congestion and small to moderate bilateral effusions. 2. Basilar airspace disease, atelectasis versus pneumonia 3. Curvilinear calcification over left ventricular apex consistent with calcified infarct Electronically Signed   By: KDonavan FoilM.D.   On: 09/27/2020 20:29   EKG: Not yet performed  Assessment/Plan Principal Problem:   Acute on chronic systolic CHF (congestive heart failure) (HCC) Active Problems:   Acquired hypothyroidism   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 (HCC)   ICD (implantable cardioverter-defibrillator) in place   Left heart failure (HCC)   [place]) [place]   On amiodarone therapy   Pure hypercholesterolemia   Stage 3 chronic kidney disease (HCC)   Type 2 diabetes mellitus with stage 3 chronic kidney disease, with long-term current use of insulin (HCC)   VT (ventricular tachycardia) (HCC)   Heartburn   Diarrhea, unspecified  Hypertension Acute on chronic systolic heart failure exacerbation acute > Last echo in 2020 with EF less than 20%, G1DD.  Status post AICD. > Generalized fatigue recently, edema on exam and imaging, bilateral pleural effusions, BNP 2539 > Etiology unclear has been taking medications and has not had a significant change in p.o. intake - Continue IV Lasix 40 mg twice daily - Strict I's/O, daily weights - Echocardiogram - Continue home Coreg, Entresto, spironolactone - Trend renal function electrolytes  IBS Diarrhea > Increased diarrhea recently, some bloody stools reported.  Hemoglobin stable. - Continue Imodium - Follow-up FOBT - SCDs for now  Hx of Ventricular tachycardia - AICD in place - Continue amiodarone  CAD Hyperlipidemia > History of MI.  No cath in chart. - Continue home aspirin, Plavix, statin, Ranexa, Coreg  Hypothyroidism - Continue  Synthroid  Diabetes > 10 units daily and SSI at home - Lantus 7 units daily, SSI  CKD 3 > Creatinine stable at 1.47 - Avoid nephrotoxic agents - Trend renal function and electrolytes  Intermittent delirium > Patient's sister reports that she gets confused every now and then it seems to be related to her p.o. intake. - Delirium precautions  GERD - Continue PPI  History of seizure  DVT prophylaxis: SCDs for now pending FOBT  Code Status:   Partial.  No chest compressions. Family Communication:  Sister who is healthcare power of attorney updated at bedside. Disposition Plan:   Patient is from:  Home  Anticipated DC to:  Home  Anticipated DC date:  Pending clinical course  Anticipated DC barriers: None  Consults called:  None  Admission status:  Inpatient, telemetry   Severity of Illness: The appropriate patient status for this patient is OBSERVATION. Observation status is judged to be reasonable and necessary in order to provide the required intensity of service to ensure the patient's safety. The patient's presenting symptoms, physical exam findings, and initial radiographic and laboratory data in the context of their medical condition is felt to place them at decreased risk for further clinical deterioration. Furthermore, it is anticipated that the patient will be medically stable for discharge from the hospital within 2 midnights of admission. The following factors support the patient status of observation.   " The patient's presenting symptoms include abdominal pain, blood in stools, fatigue. " The physical exam findings include edema, diminished breath sounds, rales. " The initial radiographic and laboratory data are BNP elevated to greater than 2500, chest x-ray with congestion and effusions, CT abdomen pelvis with anasarca, ascites and pleural effusions.   Marcelyn Bruins MD [facility]  How to contact the [facility] Attending or Consulting provider [place] or  covering provider during after hours [place], for this patient?   1. Check the care team in [facility] and look for a) attending/consulting [facility] provider listed and b) the [facility] team listed 2. Log into [email or web address] and use [place]'s universal password to access. If you do not have the password, please contact the hospital operator. 3. Locate the [facility] provider you are looking for under [facility] and page to a number that you can be directly reached. 4. If you still have difficulty reaching the provider, please page the [facility] (Director on Call) for the Hospitalists listed on amion for assistance.  09/28/2020, 12:49 AM

## 2020-09-28 NOTE — ED Notes (Signed)
Breakfast Ordered 

## 2020-09-28 NOTE — Progress Notes (Signed)
Patient admitted after midnight.  Please see H&P.  Echo shows EF<20% (simlar to priors). Spoke with family at bedside and they state patient has been declining for several months and have concerns about care at home.   Per cards visit with Dr. Bettina Gavia 11/11: She continues to slowly deteriorate mostly with weight loss weakness and what appears to be developing cardiac cachexia but her heart failure remains compensated she will continue her current loop and distal diuretic beta-blocker Entresto and MRA.  We discussed adding vericiquat however I would hold for a clinical signal of worsening symptoms of heart failure or hospitalization. -palliative care consult  Eulogio Bear DO

## 2020-09-28 NOTE — ED Notes (Signed)
Lunch Tray Ordered @ 1022. 

## 2020-09-29 ENCOUNTER — Inpatient Hospital Stay (HOSPITAL_COMMUNITY): Payer: Medicare Other

## 2020-09-29 ENCOUNTER — Encounter (HOSPITAL_COMMUNITY): Payer: Self-pay | Admitting: Internal Medicine

## 2020-09-29 LAB — BASIC METABOLIC PANEL
Anion gap: 11 (ref 5–15)
BUN: 17 mg/dL (ref 8–23)
CO2: 25 mmol/L (ref 22–32)
Calcium: 8.2 mg/dL — ABNORMAL LOW (ref 8.9–10.3)
Chloride: 99 mmol/L (ref 98–111)
Creatinine, Ser: 1.42 mg/dL — ABNORMAL HIGH (ref 0.44–1.00)
GFR, Estimated: 39 mL/min — ABNORMAL LOW (ref >60)
Glucose, Bld: 76 mg/dL (ref 70–99)
Potassium: 5 mmol/L (ref 3.5–5.1)
Sodium: 135 mmol/L (ref 135–145)

## 2020-09-29 LAB — CBC
HCT: 41.5 % (ref 36.0–46.0)
Hemoglobin: 12.5 g/dL (ref 12.0–15.0)
MCH: 29.4 pg (ref 26.0–34.0)
MCHC: 30.1 g/dL (ref 30.0–36.0)
MCV: 97.6 fL (ref 80.0–100.0)
Platelets: 96 10*3/uL — ABNORMAL LOW (ref 150–400)
RBC: 4.25 MIL/uL (ref 3.87–5.11)
RDW: 17 % — ABNORMAL HIGH (ref 11.5–15.5)
WBC: 3.5 10*3/uL — ABNORMAL LOW (ref 4.0–10.5)
nRBC: 0 % (ref 0.0–0.2)

## 2020-09-29 LAB — GLUCOSE, CAPILLARY
Glucose-Capillary: 102 mg/dL — ABNORMAL HIGH (ref 70–99)
Glucose-Capillary: 103 mg/dL — ABNORMAL HIGH (ref 70–99)
Glucose-Capillary: 116 mg/dL — ABNORMAL HIGH (ref 70–99)
Glucose-Capillary: 200 mg/dL — ABNORMAL HIGH (ref 70–99)
Glucose-Capillary: 83 mg/dL (ref 70–99)

## 2020-09-29 MED ORDER — INSULIN GLARGINE 100 UNIT/ML ~~LOC~~ SOLN: 5.0000 [IU] | Freq: Every day | SUBCUTANEOUS | Status: DC

## 2020-09-29 MED ORDER — ADULT MULTIVITAMIN W/MINERALS CH
1.0000 | ORAL_TABLET | Freq: Every day | ORAL | Status: DC
  Administered 2020-09-29 – 2020-10-03 (×5): 1 via ORAL
  Filled 2020-09-29 (×5): qty 1

## 2020-09-29 NOTE — Progress Notes (Signed)
Hypoglycemic Event  CBG: 67  Treatment: 4 oz juice/soda, 3 graham crackers with peanut butter, and 4 oz glucerna  Symptoms: None  Follow-up CBG: Time: 2233 CBG Result: 85  Possible Reasons for Event: Inadequate meal intake  Comments/MD notified: Patient states she usually doesn't eat dinner; she ate only a few bites from her dinner tray.    Jenna West

## 2020-09-29 NOTE — Evaluation (Addendum)
Physical Therapy Evaluation Patient Details Name: Jenna West MRN: [id number] DOB: December 25, 1945 Today's Date: 09/29/2020   History of Present Illness  Pt adm with acute on chronic systolic heart failure. PMH - DM, ckd, MI, ICD, ankle fx  Clinical Impression  Patient presents with generalized weakness, low energy, hypotension, impaired cognition and impaired mobility s/p above. Pt lives at home with spouse and reports being Mod I for ADLs/IADLs PTA. Pt recently started using a rollator for ambulation. Reports 1 fall. Today, mobility limited by low BP and abnormal BP response to activity. Supine BP 82/47, sitting BP 79/47, standing BP 70/45 (54), supine BP 89/48 (62), reports feeling "ucky" when standing but no dizziness. Pt emotional during session not understanding why her BP was dropping and why she was in the hospital. Requires Min A for bed mobility and Min guard assist for transfers/marching/side stepping along side bed with use of RW. Rn made aware of BP. Will follow acutely to maximize independence and mobility prior to return home.    Follow Up Recommendations Home health PT;Supervision for mobility/OOB    Equipment Recommendations  None recommended by PT    Recommendations for Other Services       Precautions / Restrictions Precautions Precautions: Fall;Other (comment) Precaution Comments: watch BP Restrictions Weight Bearing Restrictions: No      Mobility  Bed Mobility Overal bed mobility: Needs Assistance Bed Mobility: Supine to Sit;Sit to Supine     Supine to sit: Min assist;HOB elevated Sit to supine: Min guard;HOB elevated   General bed mobility comments: Able to bring LEs to EOB but not sure how to use UEs to assist with elevating trunk so pulling up on therapist; able to return self to bed without assist.    Transfers Overall transfer level: Needs assistance Equipment used: Rolling walker (2 wheeled) Transfers: Sit to/from Stand Sit to Stand: Min guard          General transfer comment: Min guard for safety. Stood from EOB x1, no dizziness but when asked multiple times due to low BP, reports feeling "ucky"  Ambulation/Gait Ambulation/Gait assistance: Min guard Gait Distance (Feet): 5 Feet Assistive device: Rolling walker (2 wheeled)   Gait velocity: decreased   General Gait Details: Able to march in place and side step along side bed with Min guard assist and RW for support in attempts to elevate BP.  Stairs            Wheelchair Mobility    Modified Rankin (Stroke Patients Only)       Balance Overall balance assessment: Needs assistance;History of Falls Sitting-balance support: Feet supported;No upper extremity supported Sitting balance-Leahy Scale: Good     Standing balance support: During functional activity Standing balance-Leahy Scale: Poor Standing balance comment: Requires UE support in standing.                             Pertinent Vitals/Pain Pain Assessment: No/denies pain    Home Living Family/patient expects to be discharged to:: Private residence Living Arrangements: Spouse/significant other Available Help at Discharge: Family;Available 24 hours/day Type of Home: House Home Access: Stairs to enter Entrance Stairs-Rails: None Entrance Stairs-Number of Steps: 1 Home Layout: Two level;Able to live on main level with bedroom/bathroom Home Equipment: Grab bars - toilet;Grab bars - tub/shower;Hand held shower head;Walker - 4 wheels;Cane - single point;Bedside commode Additional Comments: spouse name Rush Landmark has a beagle named bob ( 59 yo) and sister POA  Prior Function Level of Independence: Needs assistance   Gait / Transfers Assistance Needed: Uses rollator for ambulation as of recently; reports 1 fall in last 6 months- unsure of accuracy.  ADL's / Homemaking Assistance Needed: Does own ADLs/IADLs, does not drive.  Comments: Reports feeling like she has no energy the last few months.  Household ambulator mostly.     Hand Dominance   Dominant Hand: Right    Extremity/Trunk Assessment   Upper Extremity Assessment Upper Extremity Assessment: Defer to OT evaluation    Lower Extremity Assessment Lower Extremity Assessment: Generalized weakness (but functional)       Communication   Communication: No difficulties  Cognition Arousal/Alertness: Awake/alert Behavior During Therapy: WFL for tasks assessed/performed Overall Cognitive Status: Impaired/Different from baseline Area of Impairment: Orientation;Memory;Following commands;Safety/judgement;Awareness;Problem solving                 Orientation Level: Time;Situation   Memory: Decreased short-term memory Following Commands: Follows one step commands consistently Safety/Judgement: Decreased awareness of safety Awareness: Intellectual Problem Solving: Requires verbal cues;Slow processing General Comments: "I don't even know why I am here!" Pt emotional and tearful during session, not understanding why her BP is low. Thinks it is "July" "June" Not able to get to January despite contextual cues. Seems low energy. Reports feeling cold and tired.      General Comments General comments (skin integrity, edema, etc.): Supine BP 82/47, sitting BP 79/47, standing BP 70/45 (54), supine BP 89/48 (62), reports feeling "ucky" when standing but no dizziness.    Exercises     Assessment/Plan    PT Assessment Patient needs continued PT services  PT Problem List Decreased strength;Decreased mobility;Cardiopulmonary status limiting activity;Decreased cognition;Decreased activity tolerance       PT Treatment Interventions Therapeutic exercise;Gait training;Balance training;Therapeutic activities;Patient/family education;Functional mobility training;Cognitive remediation;DME instruction;Stair training    PT Goals (Current goals can be found in the Care Plan section)  Acute Rehab PT Goals Patient Stated Goal: to get  better and go home PT Goal Formulation: With patient Time For Goal Achievement: 10/13/20 Potential to Achieve Goals: Good    Frequency Min 3X/week   Barriers to discharge        Co-evaluation               AM-PAC PT "6 Clicks" Mobility  Outcome Measure Help needed turning from your back to your side while in a flat bed without using bedrails?: None Help needed moving from lying on your back to sitting on the side of a flat bed without using bedrails?: A Little Help needed moving to and from a bed to a chair (including a wheelchair)?: A Little Help needed standing up from a chair using your arms (e.g., wheelchair or bedside chair)?: A Little Help needed to walk in hospital room?: A Little Help needed climbing 3-5 steps with a railing? : A Little 6 Click Score: 19    End of Session Equipment Utilized During Treatment: Gait belt Activity Tolerance: Treatment limited secondary to medical complications (Comment) (hypotension) Patient left: in bed;with call bell/phone within reach;with bed alarm set;with SCD's reapplied Nurse Communication: Mobility status;Other (comment) (BP) PT Visit Diagnosis: Muscle weakness (generalized) (M62.81)    Time: 1125-1140 PT Time Calculation (min) (ACUTE ONLY): 15 min   Charges:   PT Evaluation $PT Eval Moderate Complexity: 1 Mod          Jenna West, PT, DPT Acute Rehabilitation Services Pager [phone] Office [phone]      Jenna West 09/29/2020, 12:46  PM   

## 2020-09-29 NOTE — Plan of Care (Signed)

## 2020-09-29 NOTE — Progress Notes (Signed)
Heart Failure Nurse Navigator Progress Note  PCP: Ronita Hipps, MD PCP-Cardiologist: Shirlee More, MD Admission Diagnosis: rectal bleed Admitted from: Home with spouse  Presentation:   Jenna West presented with medical history significant ofhypothyroidism, carotid artery disease, systolic heart failure, CAD, GERD, hypertension, seizure, CKD 3, diabetes, hyperlipidemia, IBS who presents with abdominal pain, rectal bleeding and fatigue. Has increased BLE edema d/t A/C CHF.  ECHO/ LVEF: <20%, has been since 08/10/2019.  Clinical Course:  Past Medical History:  Diagnosis Date  . Acquired hypothyroidism 05/01/2015  . AICD (automatic cardioverter/defibrillator) present   . Angina pectoris ([place]) 07/13/2015  . Aspirin long-term use 03/07/2016  . Benign essential hypertension 05/01/2015  . Bilateral carotid artery stenosis 03/07/2016  . 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 Carotid artery stenosis 06/21/2015  . Chronic systolic heart failure ([place]) 05/28/2017  . Coronary arteriosclerosis in native artery 05/01/2015   Overview:  Overview:  PCI and stent to LAD and RCA 2004 for MI DES to LAD for subtotal occlusion with no reflow 8/09  . Coronary artery disease involving native coronary artery of native heart with angina pectoris ([place]) 05/01/2015   Overview:  Overview:  PCI and stent to LAD and RCA 2004 for MI DES to LAD for subtotal occlusion with no reflow 8/09  . Coronary artery disease of native artery of native heart with stable angina pectoris ([place]) 05/01/2015   Overview:  PCI and stent to LAD and RCA 2004 for MI DES to LAD for subtotal occlusion with no reflow 2009  . Heartburn 05/29/2017  . 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 Hypertensive heart and kidney disease with HF and CKD ([place]) 05/28/2017  . ICD (implantable cardioverter-defibrillator) in  place 05/01/2015   Overview:  [employer]  . Ischemic cardiomyopathy 06/04/2017  . Left heart failure ([place]) 05/01/2015  . MI (myocardial infarction) ([place]) 05/01/2015  . On amiodarone therapy 08/15/2016  . Pure hypercholesterolemia 05/01/2015  . Rectal bleed 09/2020  . West 3 chronic kidney disease ([place]) 12/18/2016  . Syncope and collapse 05/01/2015  . 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 VT (ventricular tachycardia) ([place]) 05/01/2015     Social History   Socioeconomic History  . Marital status: Married    Spouse name: Not on file  . Number of children: Not on file  . Years of education: Not on file  . Highest education level: Not on file  Occupational History  . Not on file  Tobacco Use  . Smoking status: Never Smoker  . Smokeless tobacco: Never Used  Vaping Use  . Vaping Use: Never used  Substance and Sexual Activity  . Alcohol use: No  . Drug use: No  . Sexual activity: Not on file  Other Topics Concern  . Not on file  Social History Narrative  . Not on file   Social Determinants of Health   Financial Resource Strain: Low Risk   . Difficulty of Paying Living Expenses: Not very hard  Food Insecurity: No Food Insecurity  . Worried About Charity fundraiser in the Last Year: Never true  . Ran Out of Food in the Last Year: Never true  Transportation Needs: No Transportation Needs  . Lack of Transportation (Medical): No  . Lack of Transportation (Non-Medical): No  Physical Activity: Not  on file  Stress: Not on file  Social Connections: Not on file   High Risk Criteria for Readmission and/or Poor Patient Outcomes:  Heart failure hospital admissions (last 6 months): 1   No Show rate: 10%  Difficult social situation: lives at home with spouse. States spouse is healthy and well. Sister is MPOA and Therapist, sports.   Demonstrates medication adherence: yes, utilizes pill box.  Primary Language:  English  Literacy level: pt able to read and write. Complex medical comprehension is questionable.   Barriers of Care:   Progression of chronic disease.   Considerations/Referrals:   Referral made to Heart Failure Pharmacist Stewardship: no Referral made to Heart Impact Team RNCM/CSW: no  Items for Follow-up on DC/TOC: -cardiology f/u appt at discharge. -Hospitalist consulted palliative care.   Pricilla Holm, RN, BSN Heart Failure Nurse Navigator [facility] [phone]  Please reach out with any needs I can assist with.

## 2020-09-29 NOTE — Progress Notes (Signed)
Progress Note    Jenna West  DUK:[id number] DOB: 1945-11-03  DOA: 09/27/2020 PCP: Ronita Hipps, MD    Brief Narrative:     Medical records reviewed and are as summarized below:  Jenna West is an 75 y.o. female with medical history significant of hypothyroidism, carotid artery disease, systolic heart failure, CAD, GERD, hypertension, seizure, CKD 3, diabetes, hyperlipidemia, IBS who presents with abdominal pain, rectal bleeding and fatigue.  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  Status post AICD- repeat echo similar to prior > Generalized fatigue recently, edema on exam and imaging, bilateral pleural effusions and ascities, BNP 2539 - hold lasix as BP Dropping -check x ray of lungs and abdominal U/S: may need paracentesis and thoracentesis  - Strict I's/O, daily weights - Continue home Coreg, Entresto - Trend renal function electrolytes  Diarrhea > Increased diarrhea recently, some bloody stools reported - hgb stable -no sign of bleeding -per chart review she is not a candidate for EGD/colonscopy (Dr. Paulita Fujita)  Hx of Ventricular tachycardia - AICD in place - Continue amiodarone  CAD Hyperlipidemia > History of MI.  No  cath in chart. - Continue home aspirin, Plavix, statin, Ranexa, Coreg  Hypothyroidism - Continue Synthroid  Diabetes - Lantus 7 units daily, SSI -hold lantus for now  CKD 3 > Creatinine stable  - Avoid nephrotoxic agents - Trend renal function and electrolytes  dementia > Patient's sister reports that she gets confused every now and then it seems to be related to her p.o. intake. - Delirium precautions -has neurology follow up already  GERD - Continue PPI  History of seizure   Overall appears to be a failure to thrive.  She has lost upwards of 60lbs per family.  Husband drinks and does not seem to be able to manage at home.  PT/OT consult and palliative care consult.     Family Communication/Anticipated D/C date and plan/Code Status   DVT prophylaxis: Lovenox ordered. Code Status: Partial Family Communication: Spoke with sister who is her medical power of attorney on the phone Disposition Plan: Status is: Inpatient  Remains inpatient appropriate because:IV treatments appropriate due to intensity of illness or inability to take PO and Inpatient level of care appropriate due to severity of illness   Dispo: The patient is from: Home              Anticipated d/c is to: tbd              Anticipated d/c date is: 3 days              Patient currently is not medically stable to d/c.         Medical Consultants:    palliative care  Subjective:  Not sure why she is here in the hospital  Objective:    Vitals:   09/28/20 2040 09/28/20 2300 09/29/20 0439 09/29/20 0745  BP: (!) 95/59 (!) 90/55 (!) 95/49   Pulse: 71  60 70  Resp: 18 16 16 18   Temp: 97.6 F (36.4 C) (!) 97.5 F (36.4 C) 98 F (36.7 C) 97.7 F (36.5 C)  TempSrc: Oral Axillary Oral Oral  SpO2: 93% 97% 99%     Intake/Output Summary (Last 24 hours) at 09/29/2020 1406 Last data filed at 09/29/2020 0442 Gross per 24 hour  Intake 480 ml  Output 900 ml  Net -420 ml   There were no  vitals filed for this visit.  Exam:  General: Appearance:    Ill appearing female in no acute distress     Lungs:     Diminished, poor effort, respirations unlabored  Heart:    Normal heart rate.      Neurologic:   Awake, alert, pleasantly confused    Data Reviewed:   I have personally reviewed following labs and imaging studies:  Labs: Labs show the following:   Basic Metabolic Panel: Recent Labs  Lab 09/27/20 1448 09/28/20 0500 09/29/20 0120  NA 134* 135 135  K 4.4 3.9 5.0  CL 98 101 99  CO2 20* 24 25  GLUCOSE 171* 139* 76  BUN 20 16 17   CREATININE 1.47* 1.46* 1.42*  CALCIUM 9.3 8.4* 8.2*   GFR CrCl cannot be calculated (Unknown ideal weight.). Liver Function Tests: Recent Labs  Lab 09/27/20 1448 09/28/20 0500  AST 18 15  ALT 16 12  ALKPHOS 87 67  BILITOT 2.9* 2.0*  PROT 6.9 5.7*  ALBUMIN 3.5 2.8*   No results for input(s): LIPASE, AMYLASE in the last 168 hours. No results for input(s): AMMONIA in the last 168 hours. Coagulation profile No results for input(s): INR, PROTIME in the last 168 hours.  CBC: Recent Labs  Lab 09/27/20 1448 09/28/20 0500 09/29/20 0120  WBC 5.3 4.2 3.5*  HGB 14.6 12.8 12.5  HCT 47.4* 42.1 41.5  MCV 99.0 97.9 97.6  PLT PLATELET CLUMPS NOTED ON SMEAR, UNABLE TO ESTIMATE 94* 96*   Cardiac Enzymes: No results for input(s): CKTOTAL, CKMB, CKMBINDEX, TROPONINI in the last 168 hours. BNP (last 3 results) No results for input(s): PROBNP in the last 8760 hours. CBG: Recent Labs  Lab 09/28/20 2043 09/28/20 2233 09/29/20 0050 09/29/20 0742 09/29/20 1224  GLUCAP 67* 85 83 103* 200*   D-Dimer: No results for input(s): DDIMER in the last 72 hours. Hgb A1c: No results for input(s): HGBA1C in the last 72 hours. Lipid Profile: No results for input(s): CHOL, HDL, LDLCALC, TRIG, CHOLHDL, LDLDIRECT in the last 72 hours. Thyroid function studies: No results for input(s): TSH, T4TOTAL, T3FREE, THYROIDAB in the last 72  hours.  Invalid input(s): FREET3 Anemia work up: No results for input(s): VITAMINB12, FOLATE, FERRITIN, TIBC, IRON, RETICCTPCT in the last 72 hours. Sepsis Labs: Recent Labs  Lab 09/27/20 1448 09/28/20 0500 09/29/20 0120  WBC 5.3 4.2 3.5*    Microbiology No results found for this or any previous visit (from the past 240 hour(s)).  Procedures and diagnostic studies:  CT Abdomen Pelvis W Contrast  Result Date: 09/27/2020 CLINICAL DATA:  Abdominal pain with rectal bleeding EXAM: CT ABDOMEN AND PELVIS WITH CONTRAST TECHNIQUE: Multidetector CT imaging of the abdomen and pelvis was performed using the standard protocol following bolus administration of intravenous contrast. CONTRAST:  22mL OMNIPAQUE IOHEXOL 300 MG/ML  SOLN COMPARISON:  03/29/2020 FINDINGS: LOWER CHEST: Large pleural effusions. HEPATOBILIARY: Large amount of ascites in the upper abdomen. No focal hepatic lesion. There is cholelithiasis without acute inflammation. PANCREAS: Normal pancreas. No ductal dilatation or peripancreatic fluid collection. SPLEEN: Normal. ADRENALS/URINARY TRACT: The adrenal glands are normal. No hydronephrosis, nephroureterolithiasis or solid renal mass. The urinary bladder is normal for degree of distention STOMACH/BOWEL: There is no hiatal hernia. Normal duodenal course and caliber. No small bowel dilatation or inflammation. Rectosigmoid diverticulosis without acute inflammation. Normal appendix. VASCULAR/LYMPHATIC: There is calcific atherosclerosis of the abdominal aorta. No lymphadenopathy. REPRODUCTIVE: Status post hysterectomy. No adnexal mass. MUSCULOSKELETAL. Multilevel degenerative disc disease and facet arthrosis. No bony spinal canal stenosis. OTHER: Anasarca IMPRESSION: 1. Upper abdominal ascites, anasarca and large pleural effusions, favoring volume overload. 2. Diverticulosis without acute inflammation. Aortic Atherosclerosis (ICD10-I70.0). Electronically Signed   By: Ulyses Jarred M.D.   On:  09/27/2020 22:12   DG Chest Port 1 View  Result Date: 09/27/2020 CLINICAL DATA:  Diarrhea EXAM: PORTABLE CHEST 1 VIEW COMPARISON:  06/18/2020, 11/14/2017 FINDINGS: Partially visualized sclerotic lesion in the left humerus. Left-sided pacing device as before. Small moderate bilateral pleural effusions and basilar airspace disease. Cardiomegaly with central vascular congestion. Calcified left ventricular infarct. No pneumothorax IMPRESSION: 1. Cardiomegaly with vascular congestion and small to moderate bilateral effusions. 2. Basilar airspace disease, atelectasis versus pneumonia 3. Curvilinear calcification over left ventricular apex consistent with calcified infarct Electronically Signed   By: Donavan Foil M.D.   On: 09/27/2020 20:29   ECHOCARDIOGRAM COMPLETE  Result Date: 09/28/2020    ECHOCARDIOGRAM REPORT   Patient Name:   MARVENA TALLY Date of Exam: 09/28/2020 Medical Rec #:  [id number]        Height:       64.0 in Accession #:    [id number]       Weight:       144.0 lb Date of Birth:  12/22/1945         BSA:          1.701 m Patient Age:    89 years         BP:           128/71 mmHg Patient Gender: F                HR:           70 bpm. Exam Location:  Inpatient Procedure: 2D Echo, Cardiac Doppler, Color Doppler and Intracardiac            Opacification Agent Indications:    CHF  History:        Patient has prior history of Echocardiogram examinations, most                 recent 08/10/2019. CHF and Cardiomyopathy, CAD and Previous                 Myocardial Infarction, Defibrillator, Carotid Disease,                 Signs/Symptoms:Shortness of Breath; Risk Factors:Hypertension                 and Diabetes. Pleural effusions.  Sonographer:    Dustin Flock Referring Phys: [id number] [place]  1. Left ventricular ejection fraction, by estimation, is <20%. The left ventricle has severely decreased function. The left ventricle demonstrates global hypokinesis. The left ventricular  internal cavity size was moderately dilated. Left ventricular diastolic parameters are consistent with Grade  II diastolic dysfunction (pseudonormalization). Elevated left atrial pressure.  2. Right ventricular systolic function is mildly reduced. The right ventricular size is normal. There is mildly elevated pulmonary artery systolic pressure. The estimated right ventricular systolic pressure is 18.8 mmHg.  3. Right atrial size was mildly dilated.  4. The mitral valve is normal in structure. Mild mitral valve regurgitation.  5. Tricuspid valve regurgitation is mild to moderate.  6. The aortic valve is tricuspid. Aortic valve regurgitation is not visualized. No aortic stenosis is present.  7. The inferior vena cava is dilated in size with <50% respiratory variability, suggesting right atrial pressure of 15 mmHg. FINDINGS  Left Ventricle: Left ventricular ejection fraction, by estimation, is <20%. The left ventricle has severely decreased function. The left ventricle demonstrates global hypokinesis. Definity contrast agent was given IV to delineate the left ventricular endocardial borders. The left ventricular internal cavity size was moderately dilated. There is no left ventricular hypertrophy. Left ventricular diastolic parameters are consistent with Grade II diastolic dysfunction (pseudonormalization). Elevated left  atrial pressure. Right Ventricle: The right ventricular size is normal. Right vetricular wall thickness was not well visualized. Right ventricular systolic function is mildly reduced. There is mildly elevated pulmonary artery systolic pressure. The tricuspid regurgitant velocity is 2.60 m/s, and with an assumed right atrial pressure of 15 mmHg, the estimated right ventricular systolic pressure is 41.6 mmHg. Left Atrium: Left atrial size was normal in size. Right Atrium: Right atrial size was mildly dilated. Pericardium: There is no evidence of pericardial effusion. Mitral Valve: The mitral valve is  normal in structure. Mild mitral valve regurgitation. Tricuspid Valve: The tricuspid valve is normal in structure. Tricuspid valve regurgitation is mild to moderate. Aortic Valve: The aortic valve is tricuspid. Aortic valve regurgitation is not visualized. No aortic stenosis is present. Pulmonic Valve: The pulmonic valve was grossly normal. Pulmonic valve regurgitation is trivial. Aorta: The aortic root and ascending aorta are structurally normal, with no evidence of dilitation. Venous: The inferior vena cava is dilated in size with less than 50% respiratory variability, suggesting right atrial pressure of 15 mmHg. IAS/Shunts: The interatrial septum was not well visualized. Additional Comments: There is a small pleural effusion in the left lateral region.  LEFT VENTRICLE PLAX 2D LVIDd:         5.60 cm  Diastology LVIDs:         5.30 cm  LV e' medial:    3.37 cm/s LV PW:         1.00 cm  LV E/e' medial:  21.2 LV IVS:        1.10 cm  LV e' lateral:   5.87 cm/s LVOT diam:     2.00 cm  LV E/e' lateral: 12.2 LV SV:         28 LV SV Index:   16 LVOT Area:     3.14 cm  RIGHT VENTRICLE RV Basal diam:  3.80 cm RV S prime:     7.94 cm/s TAPSE (M-mode): 1.9 cm LEFT ATRIUM             Index       RIGHT ATRIUM           Index LA diam:        4.20 cm 2.47 cm/m  RA Area:     17.50 cm LA Vol (A2C):   53.2 ml 31.27 ml/m RA Volume:   49.10 ml  28.86 ml/m LA Vol (A4C):   50.6 ml 29.74 ml/m LA Biplane  Vol: 53.1 ml 31.21 ml/m  AORTIC VALVE LVOT Vmax:   56.10 cm/s LVOT Vmean:  37.100 cm/s LVOT VTI:    0.089 m  AORTA Ao Root diam: 2.70 cm MITRAL VALVE               TRICUSPID VALVE MV Area (PHT): 8.92 cm    TR Peak grad:   27.0 mmHg MV Decel Time: 85 msec     TR Vmax:        260.00 cm/s MV E velocity: 71.60 cm/s MV A velocity: 80.10 cm/s  SHUNTS MV E/A ratio:  0.89        Systemic VTI:  0.09 m                            Systemic Diam: 2.00 cm Oswaldo Milian MD Electronically signed by Oswaldo Milian MD Signature  Date/Time: 09/28/2020/11:27:39 AM    Final     Medications:   . amiodarone  200 mg Oral Daily  . aspirin EC  81 mg Oral Daily  . atorvastatin  80 mg Oral QHS  . carvedilol  6.25 mg Oral BID WC  . clopidogrel  75 mg Oral Daily  . insulin aspart  0-15 Units Subcutaneous TID WC  . insulin aspart  0-5 Units Subcutaneous QHS  . levothyroxine  100 mcg Oral QAC breakfast  . multivitamin with minerals  1 tablet Oral Daily  . ranolazine  500 mg Oral Daily  . sacubitril-valsartan  1 tablet Oral BID  . sertraline  50 mg Oral Daily  . sodium chloride flush  3 mL Intravenous Q12H  . spironolactone  12.5 mg Oral QODAY  . zolpidem  5 mg Oral QHS   Continuous Infusions:   LOS: 1 day   Geradine Girt  [facility]   How to contact the [facility] Attending or Consulting provider [place] or covering provider during after hours [place], for this patient?  1. Check the care team in [facility] and look for a) attending/consulting [facility] provider listed and b) the [facility] team listed 2. Log into [email or web address] and use [place]'s universal password to access. If you do not have the password, please contact the hospital operator. 3. Locate the [facility] provider you are looking for under [facility] and page to a number that you can be directly reached. 4. If you still have difficulty reaching the provider, please page the [facility] (Director on Call) for the Hospitalists listed on amion for assistance.  09/29/2020, 2:06 PM

## 2020-09-29 NOTE — Progress Notes (Signed)
No ICM remote transmission received for 09/25/2020 and next ICM transmission scheduled for 10/30/2020.

## 2020-09-29 NOTE — TOC Initial Note (Signed)
Transition of Care [facility]) - Initial/Assessment Note    Patient Details  Name: Jenna West MRN: [id number] Date of Birth: [birth year]  Transition of Care [facility]) CM/SW Contact:    Bethena Roys, RN Phone Number: 09/29/2020, 2:13 PM  Clinical Narrative:  Patient presented for fatigue, abdominal pain. Prior to arrival patient was from home with spouse and has support of sister. Patient has a cane and rolling walker in the home, no durable medical equipment needed. Patient has used [facility] in the past and wants to use the same services. Patient wanted the same person as before for therapy. Case Manager called [facility] and they will service the patient once the orders go into Epic. With the expected weather- it may be a delay in start time. Sister is assisting the patient with personal care services in the home and [facility] will assist with this as well. Case Manager will continue with                   Expected Discharge Plan: [place] Barriers to Discharge: Continued Medical Work up   Patient Goals and CMS Choice Patient states their goals for this hospitalization and ongoing recovery are:: to return home with home health and personal care services. CMS Medicare.gov Compare Post Acute Care list provided to:: Patient Choice offered to / list presented to : Patient  Expected Discharge Plan and Services Expected Discharge Plan: [place] In-house Referral: NA Discharge Planning Services: CM Consult Post Acute Care Choice: [place] arrangements for the past 2 months: Single Family Home                 DME Arranged: N/A DME Agency: NA       HH Arranged: RN,PT,Disease Management,Nurse's Aide (family and agency will assist with personal care services with getting that set up.) [place] Agency: [facility] Date [facility] Agency Contacted: 09/29/20 Time HH Agency Contacted: 87 Representative spoke with at [place]: Amy  Prior  Living Arrangements/Services Living arrangements for the past 2 months: [place] with:: Spouse Patient language and need for interpreter reviewed:: Yes Do you feel safe going back to the place where you live?: Yes      Need for Family Participation in Patient Care: Yes (Comment) Care giver support system in place?: Yes (comment) Current home services: DME (Patient has cane, rolling walker at home.) Criminal Activity/Legal Involvement Pertinent to Current Situation/Hospitalization: No - Comment as needed  Permission Sought/Granted Permission sought to share information with : Family Chief Financial Officer Permission granted to share information with : Yes, Verbal Permission Granted     Permission granted to share info w AGENCY: [facility], [facility]       Emotional Assessment Appearance:: Appears stated age Attitude/Demeanor/Rapport: Engaged Affect (typically observed): Appropriate Orientation: : Oriented to Situation,Oriented to  Time,Oriented to Place,Oriented to Self Alcohol / Substance Use: Not Applicable Psych Involvement: No (comment)  Admission diagnosis:  Generalized abdominal pain [R10.84] Acute exacerbation of CHF (congestive heart failure) ([place]) [I50.9] Acute on chronic combined systolic and diastolic congestive heart failure ([place]) [I50.43] Diarrhea, unspecified type [R19.7] Patient Active Problem List   Diagnosis Date Noted  . Diarrhea, unspecified 06/18/2020  . Seizure ([place]) 06/18/2020  . Ischemic cardiomyopathy 06/04/2017  . Heartburn 05/29/2017  . Acute on chronic systolic CHF (congestive heart failure) ([place]) 05/28/2017  . Hypertensive heart and kidney disease with HF and CKD ([place]) 05/28/2017  . Stage 3 chronic kidney disease ([place])  12/18/2016  . On amiodarone therapy 08/15/2016  . Type 2 diabetes mellitus with stage 3 chronic kidney disease, with long-term current use of insulin ([place]) 07/17/2016  . Aspirin long-term use 03/07/2016   . Bilateral carotid artery stenosis 03/07/2016  . Angina pectoris ([place]) 07/13/2015  . Carotid artery stenosis 06/21/2015  . Acquired hypothyroidism 05/01/2015  . 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 Cardiomyopathy ([place]) 05/01/2015  . Coronary artery disease involving native coronary artery of native heart with angina pectoris ([place]) 05/01/2015  . Coronary artery disease of native artery of native heart with stable angina pectoris ([place]) 05/01/2015  . ICD (implantable cardioverter-defibrillator) in place 05/01/2015  . Left heart failure ([place]) 05/01/2015  . MI (myocardial infarction) ([place]) 05/01/2015  . Pure hypercholesterolemia 05/01/2015  . Syncope and collapse 05/01/2015  . VT (ventricular tachycardia) ([place]) 05/01/2015  . Coronary arteriosclerosis in native artery 05/01/2015   PCP:  Ronita Hipps, MD Pharmacy:   [facility], [address], [address] [place], [address] [place] [address] Phone: [phone] Fax: [phone]  [facility] [address] [place] [address] Phone: [phone] Fax: [phone]  [facility] [place], [place] - [address] AT [address] [place] [address] Phone: [phone] Fax: [phone]  Readmission Risk Interventions No flowsheet data found.

## 2020-09-30 ENCOUNTER — Inpatient Hospital Stay (HOSPITAL_COMMUNITY): Payer: Medicare Other

## 2020-09-30 DIAGNOSIS — Z515 Encounter for palliative care: Secondary | ICD-10-CM

## 2020-09-30 DIAGNOSIS — Z95810 Presence of automatic (implantable) cardiac defibrillator: Secondary | ICD-10-CM

## 2020-09-30 DIAGNOSIS — N1832 Chronic kidney disease, stage 3b: Secondary | ICD-10-CM

## 2020-09-30 DIAGNOSIS — Z7189 Other specified counseling: Secondary | ICD-10-CM

## 2020-09-30 LAB — GLUCOSE, CAPILLARY
Glucose-Capillary: 100 mg/dL — ABNORMAL HIGH (ref 70–99)
Glucose-Capillary: 116 mg/dL — ABNORMAL HIGH (ref 70–99)
Glucose-Capillary: 172 mg/dL — ABNORMAL HIGH (ref 70–99)
Glucose-Capillary: 130 mg/dL — ABNORMAL HIGH (ref 70–99)

## 2020-09-30 LAB — BASIC METABOLIC PANEL
Anion gap: 10 (ref 5–15)
BUN: 15 mg/dL (ref 8–23)
CO2: 30 mmol/L (ref 22–32)
Calcium: 8.4 mg/dL — ABNORMAL LOW (ref 8.9–10.3)
Chloride: 96 mmol/L — ABNORMAL LOW (ref 98–111)
Creatinine, Ser: 1.55 mg/dL — ABNORMAL HIGH (ref 0.44–1.00)
GFR, Estimated: 35 mL/min — ABNORMAL LOW (ref >60)
Glucose, Bld: 91 mg/dL (ref 70–99)
Potassium: 3.5 mmol/L (ref 3.5–5.1)
Sodium: 136 mmol/L (ref 135–145)

## 2020-09-30 LAB — CBC
HCT: 39.8 % (ref 36.0–46.0)
Hemoglobin: 12.9 g/dL (ref 12.0–15.0)
MCH: 30.6 pg (ref 26.0–34.0)
MCHC: 32.4 g/dL (ref 30.0–36.0)
MCV: 94.3 fL (ref 80.0–100.0)
Platelets: 93 10*3/uL — ABNORMAL LOW (ref 150–400)
RBC: 4.22 MIL/uL (ref 3.87–5.11)
RDW: 17.2 % — ABNORMAL HIGH (ref 11.5–15.5)
WBC: 3.1 10*3/uL — ABNORMAL LOW (ref 4.0–10.5)
nRBC: 0 % (ref 0.0–0.2)

## 2020-09-30 MED ORDER — ENSURE ENLIVE PO LIQD
237.0000 mL | Freq: Two times a day (BID) | ORAL | Status: DC
  Administered 2020-09-30 – 2020-10-03 (×2): 237 mL via ORAL

## 2020-09-30 NOTE — Consult Note (Signed)
Consultation Note Date: 09/30/2020   Patient Name: Jenna West  DOB: Feb 12, 1946  MRN: [id number]  Age / Sex: 75 y.o., female  PCP: Ronita Hipps, MD Referring Physician: Geradine Girt, DO  Reason for Consultation: Establishing goals of care and Psychosocial/spiritual support  HPI/Patient Profile: 75 y.o. female  admitted on 09/27/2020 with past  medical history significant ofhypothyroidism, carotid artery disease, systolic heart failure, CAD, GERD, hypertension, seizure, CKD 3, diabetes, hyperlipidemia, IBS who presents with abdominal pain, reported rectal bleeding and fatigue.  Has been having FTT over last few months.  Second admission in the last 6 months.  Echo significant for EF of less than 20%.  CT abd / pelv 1/12 > ascites, anasarca, large pleural effusions, diverticulosis. CT chest 1/16 > moderate to large bilateral pleural effusions with associated atelectasis, CAD.  Patient and family face treatment option decisions, advanced directive decisions and anticipatory.   Clinical Assessment and Goals of Care:   This NP Wadie Lessen reviewed medical records, received report from team, assessed the patient and then meet at the patient's bedside along with her sister/[name] Hertlein to discuss diagnosis, prognosis, GOC, EOL wishes disposition and options.   Concept of Palliative Care was introduced as specialized medical care for people and their families living with serious illness.  If focuses on providing relief from the symptoms and stress of a serious illness.  The goal is to improve quality of life for both the patient and the family.  Values and goals of care important to patient and family were attempted to be elicited.   Created space and opportunity for patient  and family to explore thoughts and feelings regarding current medical sister.  Patient speaks to "everything being okay at home,  maybe I just need a little help cleaning".  Her sister on the other hand verbalizes concern regarding continued failure to thrive and increasing care needs in the home.  Patient's husband is unable to provide care that the patient needs.  This is a very difficult time for the patient, she recognizes that she is losing her independence.  She always found value and worked hard her whole life, was a people person and enjoyed "making people smile"  Education and conversation had regarding concepts specific to adult failure to thrive and the limitations of medical interventions to prolong quality of life when the body does fail to thrive.     A  discussion was had today regarding advanced directives.  Concepts specific to code status, artifical feeding and hydration, continued IV antibiotics and rehospitalization was had.  The difference between a aggressive medical intervention path  and a palliative comfort care path for this patient at this time was had.     MOST form  introduced.  We will continue conversation and completion of form prior to disposition.  At this time patient is open to all offered and available medical interventions to prolong life.  Patient remains hopeful for improvement and return to baseline.    Questions and concerns addressed.  Patient  encouraged to call with questions or concerns.     PMT will continue to support holistically.           No HPOA or ACP documents on file.  Both patient and her sister speak to the fact that her Sister [name] is her healthcare power of attorney, and that there are documents.  Documents will be brought in for scanning    SUMMARY OF RECOMMENDATIONS    Code Status/Advance Care Planning:  Limited code  Encouraged patient/family to consider DNR/DNI status understanding evidenced based poor outcomes in similar hospitalized patient, as the cause of arrest is likely associated with advanced chronic illness rather than an easily reversible  acute cardio-pulmonary event.    Palliative Prophylaxis:   Bowel Regimen, Delirium Protocol, Frequent Pain Assessment and Oral Care  Additional Recommendations (Limitations, Scope, Preferences):  Full Scope Treatment  Psycho-social/Spiritual:   Desire for further Chaplaincy support:yes  Additional Recommendations: Education on Hospice  Prognosis:   < 12 months-depending on decisions regarding life prolonging measures  Discharge Planning: Patient verbalizes that she has no desire to consider SNF for rehabilitation, her plan is to return home.  She hopes to find some additional services in the home to help with activities of daily living.    To Be Determined      Primary Diagnoses: Present on Admission: . Acquired hypothyroidism . Diarrhea, unspecified . Heartburn . 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 ICD (implantable cardioverter-defibrillator) in place . Left heart failure ([place]) . MI (myocardial infarction) ([address]) . Pure hypercholesterolemia . Stage 3 chronic kidney disease ([place])   I have reviewed the medical record, interviewed the patient and family, and examined the patient. The following aspects are pertinent.  Past Medical History:  Diagnosis Date  . Acquired hypothyroidism 05/01/2015  . AICD (automatic cardioverter/defibrillator) present   . Angina pectoris ([place]) 07/13/2015  . Aspirin long-term use 03/07/2016  . Benign essential hypertension 05/01/2015  . Bilateral carotid artery stenosis 03/07/2016  . 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 Carotid artery stenosis 06/21/2015  . Chronic systolic heart failure ([place]) 05/28/2017  . Coronary arteriosclerosis in native artery 05/01/2015   Overview:  Overview:  PCI and stent to LAD and RCA 2004 for MI DES to LAD for subtotal occlusion with no reflow 8/09  . Coronary artery disease  involving native coronary artery of native heart with angina pectoris ([place]) 05/01/2015   Overview:  Overview:  PCI and stent to LAD and RCA 2004 for MI DES to LAD for subtotal occlusion with no reflow 8/09  . Coronary artery disease of native artery of native heart with stable angina pectoris ([place]) 05/01/2015   Overview:  PCI and stent to LAD and RCA 2004 for MI DES to LAD for subtotal occlusion with no reflow 2009  . Heartburn 05/29/2017  . 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 Hypertensive heart and kidney disease with HF and CKD ([place]) 05/28/2017  . ICD (implantable cardioverter-defibrillator) in place 05/01/2015   Overview:  [employer]  . Ischemic cardiomyopathy 06/04/2017  . Left heart failure ([place]) 05/01/2015  . MI (myocardial infarction) ([place]) 05/01/2015  . On amiodarone therapy 08/15/2016  . Pure hypercholesterolemia 05/01/2015  . Rectal bleed 09/2020  . Stage 3 chronic kidney disease ([place]) 12/18/2016  . Syncope and collapse 05/01/2015  .  Type 2 diabetes mellitus with stage 3 chronic kidney disease, with long-term current use of insulin ([place]) 07/17/2016  . VT (ventricular tachycardia) ([place]) 05/01/2015   Social History   Socioeconomic History  . Marital status: Married    Spouse name: Not on file  . Number of children: Not on file  . Years of education: Not on file  . Highest education level: Not on file  Occupational History  . Not on file  Tobacco Use  . Smoking status: Never Smoker  . Smokeless tobacco: Never Used  Vaping Use  . Vaping Use: Never used  Substance and Sexual Activity  . Alcohol use: No  . Drug use: No  . Sexual activity: Not on file  Other Topics Concern  . Not on file  Social History Narrative  . Not on file   Social Determinants of Health   Financial Resource Strain: Low Risk   . Difficulty of Paying Living Expenses: Not very hard  Food Insecurity: No Food  Insecurity  . Worried About Charity fundraiser in the Last Year: Never true  . Ran Out of Food in the Last Year: Never true  Transportation Needs: No Transportation Needs  . Lack of Transportation (Medical): No  . Lack of Transportation (Non-Medical): No  Physical Activity: Not on file  Stress: Not on file  Social Connections: Not on file   Family History  Problem Relation Age of Onset  . Heart attack Father   . Heart disease Father   . Brain cancer Mother   . Diabetes Sister   . Breast cancer Sister   . Rectal cancer Daughter    Scheduled Meds: . amiodarone  200 mg Oral Daily  . aspirin EC  81 mg Oral Daily  . atorvastatin  80 mg Oral QHS  . carvedilol  6.25 mg Oral BID WC  . clopidogrel  75 mg Oral Daily  . feeding supplement  237 mL Oral BID BM  . insulin aspart  0-15 Units Subcutaneous TID WC  . insulin aspart  0-5 Units Subcutaneous QHS  . levothyroxine  100 mcg Oral QAC breakfast  . multivitamin with minerals  1 tablet Oral Daily  . ranolazine  500 mg Oral Daily  . sacubitril-valsartan  1 tablet Oral BID  . sertraline  50 mg Oral Daily  . sodium chloride flush  3 mL Intravenous Q12H  . zolpidem  5 mg Oral QHS   Continuous Infusions: PRN Meds:.acetaminophen **OR** acetaminophen, dextrose, nitroGLYCERIN, polyethylene glycol Medications Prior to Admission:  Prior to Admission medications   Medication Sig Start Date End Date Taking? Authorizing Provider  amiodarone (PACERONE) 200 MG tablet Take 1 tablet (200 mg total) by mouth daily. 08/15/20  Yes Camnitz, Will Hassell Done, MD  aspirin EC 81 MG tablet Take 81 mg by mouth daily.   Yes [provider]  atorvastatin (LIPITOR) 80 MG tablet TAKE 1 TABLET BY MOUTH  DAILY Patient taking differently: Take 80 mg by mouth at bedtime. 05/29/20  Yes Richardo Priest, MD  carvedilol (COREG) 6.25 MG tablet TAKE 1 TABLET BY MOUTH  TWICE DAILY Patient taking differently: Take 6.25 mg by mouth 2 (two) times daily with a meal. 05/24/20   Yes Camnitz, Will Hassell Done, MD  clopidogrel (PLAVIX) 75 MG tablet TAKE 1 TABLET BY MOUTH  DAILY Patient taking differently: Take 75 mg by mouth daily. 05/29/20  Yes Richardo Priest, MD  furosemide (LASIX) 20 MG tablet TAKE 1 TABLET BY MOUTH  DAILY Patient taking differently:  Take 20 mg by mouth daily. 03/28/20  Yes Richardo Priest, MD  HUMALOG 100 UNIT/ML injection Inject 12 Units into the skin daily.  05/25/17  Yes [provider]  insulin glargine (LANTUS) 100 UNIT/ML injection Inject 10 Units into the skin daily.   Yes [provider]  levothyroxine (SYNTHROID, LEVOTHROID) 100 MCG tablet Take 100 mcg by mouth daily before breakfast.  07/02/17  Yes [provider]  Multiple Vitamins-Minerals (MULTI FOR HER) TABS Take 1 tablet by mouth daily.   Yes [provider]  nitroGLYCERIN (NITROLINGUAL) 0.4 MG/SPRAY spray Place 1 spray under the tongue every 5 (five) minutes x 3 doses as needed for chest pain. 04/24/20  Yes Richardo Priest, MD  pantoprazole (PROTONIX) 20 MG tablet Take 20 mg by mouth daily. 08/23/20  Yes [provider]  ranolazine (RANEXA) 500 MG 12 hr tablet Take 500 mg by mouth daily.  11/07/15  Yes [provider]  sacubitril-valsartan (ENTRESTO) 24-26 MG Take 1 tablet by mouth 2 (two) times daily. 11/22/19  Yes Camnitz, Will Hassell Done, MD  sertraline (ZOLOFT) 50 MG tablet Take 50 mg by mouth daily. 10/24/18  Yes [provider]  spironolactone (ALDACTONE) 25 MG tablet TAKE ONE-HALF TABLET BY  MOUTH DAILY Patient taking differently: Take 12.5 mg by mouth every other day. 05/29/20  Yes Richardo Priest, MD  Vitamin D, Ergocalciferol, (DRISDOL) 1.25 MG (50000 UT) CAPS capsule Take 50,000 Units by mouth every Wednesday.    Yes [provider]  Wheat Dextrin (BENEFIBER) POWD Take 1 Scoop by mouth daily. 08/23/20  Yes [provider]  zolpidem (AMBIEN) 10 MG tablet Take 10 mg by mouth at bedtime. 07/31/19  Yes [provider]  Blood Glucose Monitoring Suppl (ACCU-CHEK AVIVA PLUS) w/Device KIT Apply 1 Dose topically daily. 04/07/19   [provider]   Allergies  Allergen Reactions  . Shellfish Allergy Swelling    Of mouth only  . Sulfa Antibiotics Other (See Comments)    Internal bleeding   Review of Systems  Constitutional: Positive for fatigue. Negative for fever.  Respiratory: Positive for shortness of breath.   Neurological: Positive for weakness.    Physical Exam Constitutional:      Appearance: She is normal weight.     Interventions: Nasal cannula in place.  Cardiovascular:     Rate and Rhythm: Normal rate.  Skin:    General: Skin is warm and dry.  Neurological:     Mental Status: She is alert.     Vital Signs: BP (!) 90/54   Pulse 74   Temp 97.6 F (36.4 C) (Oral)   Resp 18   SpO2 95%  Pain Scale: 0-10   Pain Score: 0-No pain   SpO2: SpO2: 95 % O2 Device:SpO2: 95 % O2 Flow Rate: .O2 Flow Rate (L/min): 2 L/min  IO: Intake/output summary:   Intake/Output Summary (Last 24 hours) at 09/30/2020 1132 Last data filed at 09/30/2020 1000 Gross per 24 hour  Intake 123 ml  Output 500 ml  Net -377 ml    LBM: Last BM Date: 09/27/20 Baseline Weight:   Most recent weight:       Palliative Assessment/Data: 40 %    Discussed with Dr Eliseo Squires  Time In: 1145 Time Out: 1255 Time Total: 70 minutes Greater than 50%  of this time was spent counseling and coordinating care related to the above assessment and plan.  Signed by: Wadie Lessen, NP   Please contact Palliative Medicine Team phone  at [phone] for questions and concerns.  For individual provider: See Shea Evans

## 2020-09-30 NOTE — Progress Notes (Signed)
Progress Note    [name]  INO:[id number] DOB: 11-19-1945  DOA: 09/27/2020 PCP: Ronita Hipps, MD    Brief Narrative:     Medical records reviewed and are as summarized below:  Jenna West is an 75 y.o. female with medical history significant of hypothyroidism, carotid artery disease, systolic heart failure, CAD, GERD, hypertension, seizure, CKD 3, diabetes, hyperlipidemia, IBS who presents with abdominal pain, reported rectal bleeding and fatigue.  Has been having FTT over last few months.   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  Status post AICD- repeat echo similar to prior > Generalized fatigue recently, edema on exam and imaging, bilateral pleural effusions and ascities, BNP 2539 - hold lasix as BP Dropping -U/S of ab with mild ascities X ray: Interval worsening opacification over the right mid to lower lung with stable left base opacification. Findings may be due to infection or atelectasis. Small bilateral pleural effusions right greater than left. - Strict I's/O, daily weights - Continue home Coreg, Entresto - Trend renal function electrolytes  Acute resp failure -wean to room air  Acute respiratory failure -x ray  shows: Interval worsening opacification over the right mid to lower lung with stable left base opacification. Findings may be due to infection or atelectasis. Small bilateral pleural effusions right greater than left. -consideration of ct scan of chest  Diarrhea > Increased diarrhea recently, some bloody stools reported - hgb stable -no sign of bleeding -per chart review she is not a candidate for EGD/colonscopy (Dr. Paulita Fujita)  Hx of Ventricular tachycardia - AICD in place - Continue amiodarone  CAD Hyperlipidemia > History of MI.  No cath in chart. - Continue home aspirin, Plavix, statin, Ranexa, Coreg  Hypothyroidism - Continue Synthroid  Diabetes - Lantus 7 units daily, SSI -hold lantus for now  CKD 3 > Creatinine stable  - Avoid nephrotoxic agents - Trend renal function and electrolytes  dementia > Patient's sister reports that she gets confused every now and then it seems to be related to her p.o. intake. - Delirium precautions -has neurology follow up already  GERD - Continue PPI  History of seizure   Overall appears to be a failure to thrive.  She has lost upwards of 60lbs per family.  Husband drinks and does not seem to be able to manage at home.  PT/OT consult and palliative care consult.     Family Communication/Anticipated D/C date and plan/Code Status   DVT prophylaxis: Lovenox ordered. Code Status: Partial Family Communication: Spoke with sister who is her medical power of attorney on the phone 1/14 Disposition Plan: Status is: Inpatient  Remains inpatient appropriate because:IV treatments appropriate due to intensity of illness or inability to take PO and Inpatient level of care appropriate due  to severity of illness   Dispo: The patient is from: Home              Anticipated d/c is to: tbd              Anticipated d/c date is: 3 days              Patient currently is not medically stable to d/c.         Medical Consultants:     palliative care  Subjective:   C/o soreness on her heals   Objective:    Vitals:   09/29/20 1716 09/29/20 2027 09/29/20 2130 09/30/20 0500  BP: (!) 83/49 (!) 95/53 (!) 92/52 (!) 90/54  Pulse: 72 70  74  Resp: 18 16 19 18   Temp: 98 F (36.7 C) 97.6 F (36.4 C)  97.6 F (36.4 C)  TempSrc: Oral Oral  Oral  SpO2:   95% 95%    Intake/Output Summary (Last 24 hours) at 09/30/2020 1005 Last data filed at 09/30/2020 0500 Gross per 24 hour  Intake 120 ml  Output 500 ml  Net -380 ml   There were no vitals filed for this visit.  Exam:  General: Appearance:    Well developed, well nourished female in no acute distress     Lungs:    respirations unlabored, on [place], diminished r>L  Heart:    Normal heart rate.   MS:   All extremities are intact.   Neurologic:   Awake, alert, pleasant and cooperative     Data Reviewed:   I have personally reviewed following labs and imaging studies:  Labs: Labs show the following:   Basic Metabolic Panel: Recent Labs  Lab 09/27/20 1448 09/28/20 0500 09/29/20 0120 09/30/20 0318  NA 134* 135 135 136  K 4.4 3.9 5.0 3.5  CL 98 101 99 96*  CO2 20* 24 25 30   GLUCOSE 171* 139* 76 91  BUN 20 16 17 15   CREATININE 1.47* 1.46* 1.42* 1.55*  CALCIUM 9.3 8.4* 8.2* 8.4*   GFR CrCl cannot be calculated (Unknown ideal weight.). Liver Function Tests: Recent Labs  Lab 09/27/20 1448 09/28/20 0500  AST 18 15  ALT 16 12  ALKPHOS 87 67  BILITOT 2.9* 2.0*  PROT 6.9 5.7*  ALBUMIN 3.5 2.8*   No results for input(s): LIPASE, AMYLASE in the last 168 hours. No results for input(s): AMMONIA in the last 168 hours. Coagulation profile No results for input(s): INR, PROTIME in the last 168 hours.  CBC: Recent Labs  Lab 09/27/20 1448 09/28/20 0500 09/29/20 0120 09/30/20 0318  WBC 5.3 4.2 3.5* 3.1*  HGB 14.6 12.8 12.5 12.9  HCT 47.4* 42.1 41.5 39.8  MCV 99.0 97.9 97.6 94.3  PLT PLATELET CLUMPS NOTED ON SMEAR, UNABLE TO ESTIMATE 94* 96*  93*   Cardiac Enzymes: No results for input(s): CKTOTAL, CKMB, CKMBINDEX, TROPONINI in the last 168 hours. BNP (last 3 results) No results for input(s): PROBNP in the last 8760 hours. CBG: Recent Labs  Lab 09/29/20 0742 09/29/20 1224 09/29/20 1712 09/29/20 2109 09/30/20 0746  GLUCAP 103* 200* 102* 116* 100*   D-Dimer: No results for input(s): DDIMER in the last 72 hours. Hgb A1c: No results for input(s): HGBA1C in the last 72 hours. Lipid Profile: No results for input(s): CHOL, HDL, LDLCALC, TRIG, CHOLHDL, LDLDIRECT in the last 72 hours. Thyroid function studies: No results for input(s): TSH, T4TOTAL, T3FREE, THYROIDAB in the last 72 hours.  Invalid input(s): FREET3 Anemia work  up: No results for input(s): VITAMINB12, FOLATE, FERRITIN, TIBC, IRON, RETICCTPCT in the last 72 hours. Sepsis Labs: Recent Labs  Lab 09/27/20 1448 09/28/20 0500 09/29/20 0120 09/30/20 0318  WBC 5.3 4.2 3.5* 3.1*    Microbiology No results found for this or any previous visit (from the past 240 hour(s)).  Procedures and diagnostic studies:  US Abdomen Limited  Result Date: 09/29/2020 CLINICAL DATA:  Ascites. EXAM: LIMITED ABDOMEN ULTRASOUND FOR ASCITES TECHNIQUE: Limited ultrasound survey for ascites was performed in all four abdominal quadrants. COMPARISON:  CT on 09/27/2020 FINDINGS: Mild ascites is seen in all 4 abdominal quadrants. Diffusely increased parenchymal echogenicity of the liver is also noted, consistent with hepatic steatosis. IMPRESSION: Mild ascites. Hepatic steatosis. Electronically Signed   By: Marlaine Hind M.D.   On: 09/29/2020 15:32   DG CHEST PORT 1 VIEW  Result Date: 09/30/2020 CLINICAL DATA:  Evaluate cardiopulmonary status. EXAM: PORTABLE CHEST 1 VIEW COMPARISON:  09/27/2020 and 06/18/2020 FINDINGS: Lordotic technique is demonstrated. Dual lead left-sided cardiac pacemaker unchanged. Lungs are hypoinflated demonstrate interval worsening opacification over the right mid  to lower lung with mild stable left base opacification. Likely small bilateral pleural effusions unchanged on the left and possible worsening on the right. Stable cardiomegaly with known calcification along the border of the left ventricle. Remainder of the exam is unchanged. IMPRESSION: 1. Interval worsening opacification over the right mid to lower lung with stable left base opacification. Findings may be due to infection or atelectasis. Small bilateral pleural effusions right greater than left. 2. Stable cardiomegaly with calcification along the left ventricular wall. Electronically Signed   By: Marin Olp M.D.   On: 09/30/2020 08:43   ECHOCARDIOGRAM COMPLETE  Result Date: 09/28/2020    ECHOCARDIOGRAM REPORT   Patient Name:   Jenna West Date of Exam: 09/28/2020 Medical Rec #:  [id number]        Height:       64.0 in Accession #:    [id number]       Weight:       144.0 lb Date of Birth:  11-May-1946         BSA:          1.701 m Patient Age:    6 years         BP:           128/71 mmHg Patient Gender: F                HR:           70 bpm. Exam Location:  Inpatient Procedure: 2D Echo, Cardiac Doppler, Color Doppler and Intracardiac            Opacification Agent Indications:    CHF  History:        Patient has prior history of Echocardiogram examinations, most                 recent 08/10/2019. CHF and Cardiomyopathy, CAD and Previous                 Myocardial Infarction, Defibrillator, Carotid Disease,                 Signs/Symptoms:Shortness of Breath; Risk Factors:Hypertension                 and Diabetes. Pleural effusions.  Sonographer:    Dustin Flock Referring Phys: [id number] [place]  1. Left ventricular ejection fraction, by estimation, is <20%. The left ventricle  has severely decreased function. The left ventricle demonstrates global hypokinesis. The left ventricular internal cavity size was moderately dilated. Left ventricular diastolic parameters are consistent with  Grade II diastolic dysfunction (pseudonormalization). Elevated left atrial pressure.  2. Right ventricular systolic function is mildly reduced. The right ventricular size is normal. There is mildly elevated pulmonary artery systolic pressure. The estimated right ventricular systolic pressure is 83.4 mmHg.  3. Right atrial size was mildly dilated.  4. The mitral valve is normal in structure. Mild mitral valve regurgitation.  5. Tricuspid valve regurgitation is mild to moderate.  6. The aortic valve is tricuspid. Aortic valve regurgitation is not visualized. No aortic stenosis is present.  7. The inferior vena cava is dilated in size with <50% respiratory variability, suggesting right atrial pressure of 15 mmHg. FINDINGS  Left Ventricle: Left ventricular ejection fraction, by estimation, is <20%. The left ventricle has severely decreased function. The left ventricle demonstrates global hypokinesis. Definity contrast agent was given IV to delineate the left ventricular endocardial borders. The left ventricular internal cavity size was moderately dilated. There is no left ventricular hypertrophy. Left ventricular diastolic parameters are consistent with Grade II diastolic dysfunction (pseudonormalization). Elevated left  atrial pressure. Right Ventricle: The right ventricular size is normal. Right vetricular wall thickness was not well visualized. Right ventricular systolic function is mildly reduced. There is mildly elevated pulmonary artery systolic pressure. The tricuspid regurgitant velocity is 2.60 m/s, and with an assumed right atrial pressure of 15 mmHg, the estimated right ventricular systolic pressure is 19.6 mmHg. Left Atrium: Left atrial size was normal in size. Right Atrium: Right atrial size was mildly dilated. Pericardium: There is no evidence of pericardial effusion. Mitral Valve: The mitral valve is normal in structure. Mild mitral valve regurgitation. Tricuspid Valve: The tricuspid valve is normal in  structure. Tricuspid valve regurgitation is mild to moderate. Aortic Valve: The aortic valve is tricuspid. Aortic valve regurgitation is not visualized. No aortic stenosis is present. Pulmonic Valve: The pulmonic valve was grossly normal. Pulmonic valve regurgitation is trivial. Aorta: The aortic root and ascending aorta are structurally normal, with no evidence of dilitation. Venous: The inferior vena cava is dilated in size with less than 50% respiratory variability, suggesting right atrial pressure of 15 mmHg. IAS/Shunts: The interatrial septum was not well visualized. Additional Comments: There is a small pleural effusion in the left lateral region.  LEFT VENTRICLE PLAX 2D LVIDd:         5.60 cm  Diastology LVIDs:         5.30 cm  LV e' medial:    3.37 cm/s LV PW:         1.00 cm  LV E/e' medial:  21.2 LV IVS:        1.10 cm  LV e' lateral:   5.87 cm/s LVOT diam:     2.00 cm  LV E/e' lateral: 12.2 LV SV:         28 LV SV Index:   16 LVOT Area:     3.14 cm  RIGHT VENTRICLE RV Basal diam:  3.80 cm RV S prime:     7.94 cm/s TAPSE (M-mode): 1.9 cm LEFT ATRIUM             Index       RIGHT ATRIUM           Index LA diam:        4.20 cm 2.47 cm/m  RA Area:     17.50 cm LA  Vol [facility]   53.2 ml 31.27 ml/m RA Volume:   49.10 ml  28.86 ml/m LA Vol (A4C):   50.6 ml 29.74 ml/m LA Biplane Vol: 53.1 ml 31.21 ml/m  AORTIC VALVE LVOT Vmax:   56.10 cm/s LVOT Vmean:  37.100 cm/s LVOT VTI:    0.089 m  AORTA Ao Root diam: 2.70 cm MITRAL VALVE               TRICUSPID VALVE MV Area (PHT): 8.92 cm    TR Peak grad:   27.0 mmHg MV Decel Time: 85 msec     TR Vmax:        260.00 cm/s MV E velocity: 71.60 cm/s MV A velocity: 80.10 cm/s  SHUNTS MV E/A ratio:  0.89        Systemic VTI:  0.09 m                            Systemic Diam: 2.00 cm Oswaldo Milian MD Electronically signed by Oswaldo Milian MD Signature Date/Time: 09/28/2020/11:27:39 AM    Final     Medications:   . amiodarone  200 mg Oral Daily  . aspirin  EC  81 mg Oral Daily  . atorvastatin  80 mg Oral QHS  . carvedilol  6.25 mg Oral BID WC  . clopidogrel  75 mg Oral Daily  . feeding supplement  237 mL Oral BID BM  . insulin aspart  0-15 Units Subcutaneous TID WC  . insulin aspart  0-5 Units Subcutaneous QHS  . levothyroxine  100 mcg Oral QAC breakfast  . multivitamin with minerals  1 tablet Oral Daily  . ranolazine  500 mg Oral Daily  . sacubitril-valsartan  1 tablet Oral BID  . sertraline  50 mg Oral Daily  . sodium chloride flush  3 mL Intravenous Q12H  . zolpidem  5 mg Oral QHS   Continuous Infusions:   LOS: 2 days   Geradine Girt  [facility]   How to contact the [facility] Attending or Consulting provider [place] or covering provider during after hours [place], for this patient?  1. Check the care team in [facility] and look for a) attending/consulting [facility] provider listed and b) the [facility] team listed 2. Log into [email or web address] and use [facility]'s universal password to access. If you do not have the password, please contact the hospital operator. 3. Locate the [facility] provider you are looking for under [facility] and page to a number that you can be directly reached. 4. If you still have difficulty reaching the provider, please page the [facility] (Director on Call) for the Hospitalists listed on amion for assistance.  09/30/2020, 10:05 AM

## 2020-09-30 NOTE — Plan of Care (Signed)

## 2020-09-30 NOTE — Progress Notes (Signed)
Initial Nutrition Assessment  DOCUMENTATION CODES:   Not applicable  INTERVENTION:   -Ensure Enlive po BID, each supplement provides 350 kcal and 20 grams of protein -MVI with minerals daily -Magic cup TID with meals, each supplement provides 290 kcal and 9 grams of protein -Recommend obtaining new wt  NUTRITION DIAGNOSIS:   Increased nutrient needs related to chronic illness (CHF) as evidenced by estimated needs. GOAL:   Patient will meet greater than or equal to 90% of their needs  MONITOR:   PO intake,Supplement acceptance,Labs,Weight trends,Skin,I & O's  REASON FOR ASSESSMENT:   Malnutrition Screening Tool    ASSESSMENT:   Jenna West is a 75 y.o. female with medical history significant of hypothyroidism, carotid artery disease, systolic heart failure, CAD, GERD, hypertension, seizure, CKD 3, diabetes, hyperlipidemia, IBS who presents with abdominal pain, rectal bleeding and fatigue.  Pt admitted with HTN and CHF exacerbation.   Reviewed I/O's: -380 ml x 24 hours and -800 ml since admission  UOP: 500 ml x 24 hours  Pt unavailable at time of attempted contact.  Per chart review, pt family reports that pt has been experiencing a general decline in health over the past few month, including weakness, weight loss, and cardiac cachexia.   No meal completion records to assess at this time. Per RN note, pt with hypoglycemic episode on 09/28/20.   No new weight from the admission to assess at this time. Suspect wt loss (as well as fat and muscle depletions) may be masked by edema. Pt is at high risk for malnutrition, however, RD unable to identify at this time.   Pt at high risk for malnutrition and would benefit from nutrient dense supplement. One Ensure Enlive supplement provides 350 kcals, 20 grams protein, and 44-45 grams of carbohydrate vs one Glucerna shake supplement, which provides 220 kcals, 10 grams of protein, and 26 grams of carbohydrate. Given pt's hx of DM,  RD will reassess adequacy of PO intake, CBGS, and adjust supplement regimen as appropriate at follow-up.   Pt awaiting palliative care consult for goals of care discussions.   Lab Results  Component Value Date   HGBA1C 6.6 (H) 06/18/2020   PTA DM medications are 12 units insulin lispro daily and 10 units insulin glargine daily.   Labs reviewed: CBGS: 100-116 (inpatient orders for glycemic control are 0-15 units insulin aspart TID with meals and 0-5 units insulin aspart daily at bedtime).   Diet Order:   Diet Order            Diet heart healthy/carb modified Room service appropriate? Yes; Fluid consistency: Thin; Fluid restriction: 1800 mL Fluid  Diet effective now                 EDUCATION NEEDS:   No education needs have been identified at this time  Skin:  Skin Assessment: Reviewed RN Assessment  Last BM:  09/27/20  Height:   Ht Readings from Last 1 Encounters:  07/27/20 5\' 4"  (1.626 m)    Weight:   Wt Readings from Last 1 Encounters:  07/27/20 65.3 kg    Ideal Body Weight:  54.5 kg  BMI:  There is no height or weight on file to calculate BMI.  Estimated Nutritional Needs:   Kcal:  1700-1900  Protein:  95-110 grams  Fluid:  1.8 L    Loistine Chance, RD, LDN, [place] Registered Dietitian II Certified Diabetes Care and Education Specialist Please refer to [facility] for RD and/or RD on-call/weekend/after hours pager

## 2020-10-01 ENCOUNTER — Inpatient Hospital Stay (HOSPITAL_COMMUNITY): Payer: Medicare Other

## 2020-10-01 DIAGNOSIS — E1122 Type 2 diabetes mellitus with diabetic chronic kidney disease: Secondary | ICD-10-CM

## 2020-10-01 DIAGNOSIS — I5043 Acute on chronic combined systolic (congestive) and diastolic (congestive) heart failure: Secondary | ICD-10-CM

## 2020-10-01 DIAGNOSIS — Z794 Long term (current) use of insulin: Secondary | ICD-10-CM

## 2020-10-01 DIAGNOSIS — N1831 Chronic kidney disease, stage 3a: Secondary | ICD-10-CM

## 2020-10-01 LAB — CBC
HCT: 36.1 % (ref 36.0–46.0)
Hemoglobin: 11.8 g/dL — ABNORMAL LOW (ref 12.0–15.0)
MCH: 31.1 pg (ref 26.0–34.0)
MCHC: 32.7 g/dL (ref 30.0–36.0)
MCV: 95 fL (ref 80.0–100.0)
Platelets: 102 10*3/uL — ABNORMAL LOW (ref 150–400)
RBC: 3.8 MIL/uL — ABNORMAL LOW (ref 3.87–5.11)
RDW: 17.1 % — ABNORMAL HIGH (ref 11.5–15.5)
WBC: 3.8 10*3/uL — ABNORMAL LOW (ref 4.0–10.5)
nRBC: 0 % (ref 0.0–0.2)

## 2020-10-01 LAB — GLUCOSE, CAPILLARY
Glucose-Capillary: 205 mg/dL — ABNORMAL HIGH (ref 70–99)
Glucose-Capillary: 82 mg/dL (ref 70–99)
Glucose-Capillary: 112 mg/dL — ABNORMAL HIGH (ref 70–99)
Glucose-Capillary: 120 mg/dL — ABNORMAL HIGH (ref 70–99)

## 2020-10-01 LAB — BASIC METABOLIC PANEL
Anion gap: 10 (ref 5–15)
BUN: 18 mg/dL (ref 8–23)
CO2: 30 mmol/L (ref 22–32)
Calcium: 8.2 mg/dL — ABNORMAL LOW (ref 8.9–10.3)
Chloride: 97 mmol/L — ABNORMAL LOW (ref 98–111)
Creatinine, Ser: 1.81 mg/dL — ABNORMAL HIGH (ref 0.44–1.00)
GFR, Estimated: 29 mL/min — ABNORMAL LOW (ref >60)
Glucose, Bld: 115 mg/dL — ABNORMAL HIGH (ref 70–99)
Potassium: 3.6 mmol/L (ref 3.5–5.1)
Sodium: 137 mmol/L (ref 135–145)

## 2020-10-01 MED ORDER — SACUBITRIL-VALSARTAN 24-26 MG PO TABS
1.0000 | ORAL_TABLET | Freq: Two times a day (BID) | ORAL | Status: DC
  Administered 2020-10-02 – 2020-10-03 (×3): 1 via ORAL
  Filled 2020-10-01 (×3): qty 1

## 2020-10-01 NOTE — Plan of Care (Signed)
  Problem: Education: Goal: Knowledge of General Education information will improve Description: Including pain rating scale, medication(s)/side effects and non-pharmacologic comfort measures Outcome: Progressing   Problem: Clinical Measurements: Goal: Will remain free from infection Outcome: Progressing   

## 2020-10-01 NOTE — Progress Notes (Signed)
Progress Note    Jenna West  KGY:[id number] DOB: 1945/10/29  DOA: 09/27/2020 PCP: Ronita Hipps, MD    Brief Narrative:     Medical records reviewed and are as summarized below:  Jenna West is an 75 y.o. female with medical history significant of hypothyroidism, carotid artery disease, systolic heart failure, CAD, GERD, hypertension, seizure, CKD 3, diabetes, hyperlipidemia, IBS who presents with abdominal pain, reported rectal bleeding and fatigue.  Has been having FTT over last few months.   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  Status post AICD- repeat echo similar to prior > Generalized fatigue recently, edema on exam and imaging, bilateral pleural effusions and ascities, BNP 2539 - hold lasix as BP Dropping -U/S of ab with mild ascities X ray: Interval worsening opacification over the right mid to lower lung with stable left base opacification. Findings may be due to infection or atelectasis. Small bilateral pleural effusions right greater than left. - Strict I's/O, daily weights - Continue home Coreg, Entresto - Trend renal function electrolytes  Acute resp failure -wean to room air  Acute respiratory failure -x ray  shows: Interval worsening opacification over the right mid to lower lung with stable left base opacification. Findings may be due to infection or atelectasis. Small bilateral pleural effusions right greater than left. -get ct scan of chest  Diarrhea > Increased diarrhea recently, some bloody stools reported - hgb stable -no sign of bleeding -per chart review she is not a candidate for EGD/colonscopy (Dr. Paulita Fujita)  Hx of Ventricular tachycardia - AICD in place - Continue amiodarone  CAD Hyperlipidemia > History of MI.  No cath in chart. - Continue home aspirin, Plavix, statin, Ranexa, Coreg  Hypothyroidism - Continue Synthroid  Diabetes - Lantus 7 units daily, SSI -hold lantus for now  CKD 3 > Creatinine stable  - Avoid nephrotoxic agents - Trend renal function and electrolytes  dementia > Patient's sister reports that she gets confused every now and then it seems to be related to her p.o. intake. - Delirium precautions -has neurology follow up already  GERD - Continue PPI  History of seizure   Overall appears to be a failure to thrive.  She has lost upwards of 60lbs per family.  Husband drinks and does not seem to be able to manage at home.  PT/OT consult and palliative care consult.     Family Communication/Anticipated D/C date and plan/Code Status   DVT prophylaxis: Lovenox ordered. Code Status: Partial Family Communication: Spoke with sister who is her medical power of attorney on the phone 1/16 Disposition Plan: Status is: Inpatient  Remains inpatient appropriate because:IV treatments appropriate due to intensity of illness or inability to take PO and Inpatient level of care appropriate due to  severity of illness   Dispo: The patient is from: Home              Anticipated d/c is to: tbd              Anticipated d/c date is: 3 days              Patient currently is not medically stable to d/c.         Medical Consultants:    palliative  care  Subjective:   No complaints  Objective:    Vitals:   09/30/20 2032 09/30/20 2330 10/01/20 0633 10/01/20 0743  BP: (!) 83/49 (!) 82/50 (!) 98/52 (!) 96/57  Pulse:   70   Resp: 20 19 18    Temp: 97.8 F (36.6 C) 97.7 F (36.5 C) 97.6 F (36.4 C)   TempSrc: Oral Oral Oral   SpO2: 95% 91% 100%   Weight:      Height:        Intake/Output Summary (Last 24 hours) at 10/01/2020 1215 Last data filed at 10/01/2020 0700 Gross per 24 hour  Intake 363 ml  Output 450 ml  Net -87 ml   Filed Weights   09/30/20 0900  Weight: 64.6 kg    Exam:  General: Appearance:    elderly female in no acute distress     Lungs:     respirations unlabored  Heart:    Normal heart rate. Normal rhythm. No murmurs, rubs, or gallops.   MS:   All extremities are intact.   Neurologic:   Awake, alert, pleasant and cooperative      Data Reviewed:   I have personally reviewed following labs and imaging studies:  Labs: Labs show the following:   Basic Metabolic Panel: Recent Labs  Lab 09/27/20 1448 09/28/20 0500 09/29/20 0120 09/30/20 0318 10/01/20 0310  NA 134* 135 135 136 137  K 4.4 3.9 5.0 3.5 3.6  CL 98 101 99 96* 97*  CO2 20* 24 25 30 30   GLUCOSE 171* 139* 76 91 115*  BUN 20 16 17 15 18   CREATININE 1.47* 1.46* 1.42* 1.55* 1.81*  CALCIUM 9.3 8.4* 8.2* 8.4* 8.2*   GFR Estimated Creatinine Clearance: 23.5 mL/min (A) (by C-G formula based on SCr of 1.81 mg/dL (H)). Liver Function Tests: Recent Labs  Lab 09/27/20 1448 09/28/20 0500  AST 18 15  ALT 16 12  ALKPHOS 87 67  BILITOT 2.9* 2.0*  PROT 6.9 5.7*  ALBUMIN 3.5 2.8*   No results for input(s): LIPASE, AMYLASE in the last 168 hours. No results for input(s): AMMONIA in the last 168 hours. Coagulation profile No results for input(s): INR, PROTIME in the last 168 hours.  CBC: Recent Labs  Lab 09/27/20 1448 09/28/20 0500 09/29/20 0120 09/30/20 0318 10/01/20 0310  WBC 5.3 4.2 3.5* 3.1* 3.8*  HGB 14.6 12.8 12.5  12.9 11.8*  HCT 47.4* 42.1 41.5 39.8 36.1  MCV 99.0 97.9 97.6 94.3 95.0  PLT PLATELET CLUMPS NOTED ON SMEAR, UNABLE TO ESTIMATE 94* 96* 93* 102*   Cardiac Enzymes: No results for input(s): CKTOTAL, CKMB, CKMBINDEX, TROPONINI in the last 168 hours. BNP (last 3 results) No results for input(s): PROBNP in the last 8760 hours. CBG: Recent Labs  Lab 09/30/20 1138 09/30/20 1652 09/30/20 2101 10/01/20 0836 10/01/20 1210  GLUCAP 116* 172* 130* 205* 82   D-Dimer: No results for input(s): DDIMER in the last 72 hours. Hgb A1c: No results for input(s): HGBA1C in the last 72 hours.  Lipid Profile: No results for input(s): CHOL, HDL, LDLCALC, TRIG, CHOLHDL, LDLDIRECT in the last 72 hours. Thyroid function studies: No results for input(s): TSH, T4TOTAL, T3FREE, THYROIDAB in the last 72 hours.  Invalid input(s): FREET3 Anemia work up: No results for input(s): VITAMINB12, FOLATE, FERRITIN, TIBC, IRON, RETICCTPCT in the last 72 hours. Sepsis Labs: Recent Labs  Lab 09/28/20 0500 09/29/20 0120 09/30/20 0318 10/01/20 0310  WBC 4.2 3.5* 3.1* 3.8*    Microbiology No results found for this or any previous visit (from the past 240 hour(s)).  Procedures and diagnostic studies:  US Abdomen Limited  Result Date: 09/29/2020 CLINICAL DATA:  Ascites. EXAM: LIMITED ABDOMEN ULTRASOUND FOR ASCITES TECHNIQUE: Limited ultrasound survey for ascites was performed in all four abdominal quadrants. COMPARISON:  CT on 09/27/2020 FINDINGS: Mild ascites is seen in all 4 abdominal quadrants. Diffusely increased parenchymal echogenicity of the liver is also noted, consistent with hepatic steatosis. IMPRESSION: Mild ascites. Hepatic steatosis. Electronically Signed   By: Marlaine Hind M.D.   On: 09/29/2020 15:32   DG CHEST PORT 1 VIEW  Result Date: 09/30/2020 CLINICAL DATA:  Evaluate cardiopulmonary status. EXAM: PORTABLE CHEST 1 VIEW COMPARISON:  09/27/2020 and 06/18/2020 FINDINGS: Lordotic technique is  demonstrated. Dual lead left-sided cardiac pacemaker unchanged. Lungs are hypoinflated demonstrate interval worsening opacification over the right mid to lower lung with mild stable left base opacification. Likely small bilateral pleural effusions unchanged on the left and possible worsening on the right. Stable cardiomegaly with known calcification along the border of the left ventricle. Remainder of the exam is unchanged. IMPRESSION: 1. Interval worsening opacification over the right mid to lower lung with stable left base opacification. Findings may be due to infection or atelectasis. Small bilateral pleural effusions right greater than left. 2. Stable cardiomegaly with calcification along the left ventricular wall. Electronically Signed   By: Marin Olp M.D.   On: 09/30/2020 08:43    Medications:   . amiodarone  200 mg Oral Daily  . aspirin EC  81 mg Oral Daily  . atorvastatin  80 mg Oral QHS  . carvedilol  6.25 mg Oral BID WC  . clopidogrel  75 mg Oral Daily  . feeding supplement  237 mL Oral BID BM  . insulin aspart  0-15 Units Subcutaneous TID WC  . insulin aspart  0-5 Units Subcutaneous QHS  . levothyroxine  100 mcg Oral QAC breakfast  . multivitamin with minerals  1 tablet Oral Daily  . ranolazine  500 mg Oral Daily  . [START ON 10/02/2020] sacubitril-valsartan  1 tablet Oral BID  . sertraline  50 mg Oral Daily  . sodium chloride flush  3 mL Intravenous Q12H  . zolpidem  5 mg Oral QHS   Continuous Infusions:   LOS: 3 days   Geradine Girt  [facility]   How to contact the [facility] Attending or Consulting provider [place] or covering provider during after hours [place], for this patient?  1. Check the care team in [facility] and look for a) attending/consulting TRH provider listed and b) the [facility] team listed 2. Log into [email or web address] and use 's universal password to access. If you do not have the password, please contact the hospital operator. 3. Locate the [facility] provider  you are looking for under [facility] and page to a number that you can be directly reached. 4. If you still have difficulty reaching the provider, please page the [facility] (Director on Call) for the Hospitalists listed on amion  for assistance.  10/01/2020, 12:15 PM

## 2020-10-01 NOTE — Progress Notes (Signed)
Applied SCD to BLE and prevalon boots to bilateral feet at HS.  Pt requested removal of both approx 2 hours after applying.  Patient stated she would try them again in am.

## 2020-10-02 ENCOUNTER — Encounter: Payer: Medicare Other | Admitting: Cardiology

## 2020-10-02 DIAGNOSIS — R531 Weakness: Secondary | ICD-10-CM

## 2020-10-02 DIAGNOSIS — I130 Hypertensive heart and chronic kidney disease with heart failure and stage 1 through stage 4 chronic kidney disease, or unspecified chronic kidney disease: Principal | ICD-10-CM

## 2020-10-02 DIAGNOSIS — I5023 Acute on chronic systolic (congestive) heart failure: Secondary | ICD-10-CM | POA: Diagnosis not present

## 2020-10-02 DIAGNOSIS — J90 Pleural effusion, not elsewhere classified: Secondary | ICD-10-CM

## 2020-10-02 DIAGNOSIS — N184 Chronic kidney disease, stage 4 (severe): Secondary | ICD-10-CM

## 2020-10-02 DIAGNOSIS — I5022 Chronic systolic (congestive) heart failure: Secondary | ICD-10-CM | POA: Diagnosis not present

## 2020-10-02 LAB — CBC
HCT: 39.9 % (ref 36.0–46.0)
Hemoglobin: 12.4 g/dL (ref 12.0–15.0)
MCH: 29.8 pg (ref 26.0–34.0)
MCHC: 31.1 g/dL (ref 30.0–36.0)
MCV: 95.9 fL (ref 80.0–100.0)
Platelets: 79 10*3/uL — ABNORMAL LOW (ref 150–400)
RBC: 4.16 MIL/uL (ref 3.87–5.11)
RDW: 17.4 % — ABNORMAL HIGH (ref 11.5–15.5)
WBC: 3.5 10*3/uL — ABNORMAL LOW (ref 4.0–10.5)
nRBC: 0 % (ref 0.0–0.2)

## 2020-10-02 LAB — BASIC METABOLIC PANEL
Anion gap: 11 (ref 5–15)
BUN: 20 mg/dL (ref 8–23)
CO2: 27 mmol/L (ref 22–32)
Calcium: 8.2 mg/dL — ABNORMAL LOW (ref 8.9–10.3)
Chloride: 96 mmol/L — ABNORMAL LOW (ref 98–111)
Creatinine, Ser: 1.8 mg/dL — ABNORMAL HIGH (ref 0.44–1.00)
GFR, Estimated: 29 mL/min — ABNORMAL LOW (ref >60)
Glucose, Bld: 110 mg/dL — ABNORMAL HIGH (ref 70–99)
Potassium: 4 mmol/L (ref 3.5–5.1)
Sodium: 134 mmol/L — ABNORMAL LOW (ref 135–145)

## 2020-10-02 LAB — GLUCOSE, CAPILLARY
Glucose-Capillary: 99 mg/dL (ref 70–99)
Glucose-Capillary: 149 mg/dL — ABNORMAL HIGH (ref 70–99)
Glucose-Capillary: 144 mg/dL — ABNORMAL HIGH (ref 70–99)
Glucose-Capillary: 131 mg/dL — ABNORMAL HIGH (ref 70–99)

## 2020-10-02 MED ORDER — ALBUMIN HUMAN 25 % IV SOLN: 12.5000 g | Freq: Once | INTRAVENOUS | Status: DC

## 2020-10-02 MED ORDER — ONDANSETRON HCL 4 MG/2ML IJ SOLN
4.0000 mg | Freq: Four times a day (QID) | INTRAMUSCULAR | Status: DC | PRN
  Administered 2020-10-02: 4 mg via INTRAVENOUS
  Filled 2020-10-02: qty 2

## 2020-10-02 MED ORDER — ALBUMIN HUMAN 25 % IV SOLN
12.5000 g | Freq: Four times a day (QID) | INTRAVENOUS | Status: AC
  Administered 2020-10-02 (×2): 12.5 g via INTRAVENOUS
  Filled 2020-10-02 (×2): qty 50

## 2020-10-02 NOTE — Progress Notes (Signed)
Patient ID: FUJIE DICKISON, female   DOB: July 31, 1946, 75 y.o.   MRN: [id number]  This NP visited patient at the bedside as a follow up to  yesterday's [place].   Medical records reviewed.    Continued conversation at bedside regarding current medical situation; diagnoses, prognosis, treatments options,   GOCs, disposition and options.  Education offered regarding patient's current medical situation.  Patient has multiple comorbidities; advanced heart failure complicated by chronic kidney disease, continued physical and functional decline and mild cognitive impairment. She is having increasing care needs in the home.  Patient acknowledges her serious illness however she has limited insight into her increasing care needs and safety in the home.  Discussed with patient the importance of continued conversation with her  family and the medical providers regarding overall plan of care and treatment options,  ensuring decisions are within the context of the patients values and GOCs.  Education offered on the difference between an aggressive medical intervention path and a palliative comfort path for this patient at this time in the situation.  Patient remains open to all offered and available medical interventions to prolong life.  With patient's permission I then spoke to her sister [name] regarding above conversation. She verbalizes  an understanding of the seriousness of the medical situation and the hard decisions facing them regarding treatment options, advanced directive decisions and anticipatory care needs.  Questions and concerns addressed   Discussed with Dr Eliseo Squires  Recommend OP Palliaitve services to follow on discharge.  Patient verbalizes that she is not interested in any skilled nursing facility and that plan on discharge will be home with home health and palliative care services.  Total time spent on the unit was 35 minutes  Greater than 50% of the time was spent in counseling and  coordination of care  Wadie Lessen NP  Palliative Medicine Team Team Phone # [phone] Pager [phone]

## 2020-10-02 NOTE — Progress Notes (Signed)
Progress Note    Jenna West  NTI:[id number] DOB: October 24, 1945  DOA: 09/27/2020 PCP: Ronita Hipps, MD    Brief Narrative:     Medical records reviewed and are as summarized below:  Jenna West is an 75 y.o. female with medical history significant of hypothyroidism, carotid artery disease, systolic heart failure, CAD, GERD, hypertension, seizure, CKD 3, diabetes, hyperlipidemia, IBS who presents with abdominal pain, reported rectal bleeding and fatigue.  Has been having FTT over last few months.  Difficult to diurese due to hypotension.  ? Need for thoracentesis.   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  Status post AICD- repeat echo similar to prior > Generalized fatigue recently, edema on exam and imaging, bilateral pleural effusions and ascities, BNP 2539 - hold lasix as BP Dropping -U/S of ab with mild ascities X ray: Interval worsening opacification over the right mid to lower lung with stable left base opacification. Findings may be due to infection or atelectasis. Small bilateral pleural effusions right greater than left. - Strict I's/O, daily weights - Continue home Coreg, Entresto - Trend renal function electrolytes  Acute resp  failure due to hypoxia -wean to room air  Acute respiratory failure due to b/l pleural affusions -x ray shows: Interval worsening opacification over the right mid to lower lung with stable left base opacification. Findings may be due to infection or atelectasis. Small bilateral pleural effusions right greater than left. - ct scan of chest:Moderate to large bilateral pleural effusions are noted with associated atelectasis of both lower lobes. -consult for thoracentesis- diagnostic (never had before) and therapeutic  Diarrhea > Increased diarrhea recently, some bloody stools reported - hgb stable -no sign of bleeding -per chart review she is not a candidate for EGD/colonscopy (Dr. Paulita Fujita)  Hx of Ventricular tachycardia - AICD in place - Continue amiodarone  CAD Hyperlipidemia > History of MI.  No cath in chart. - Continue home aspirin, Plavix, statin, Ranexa, Coreg  Hypothyroidism - Continue Synthroid  Diabetes - SSI -hold lantus for now  CKD 3b > Creatinine stable  - Avoid nephrotoxic agents - Trend renal function and electrolytes  dementia > Patient's sister reports that she gets confused every now and then it seems to be related to her p.o. intake. - Delirium precautions -has neurology follow up outpatient already  GERD - Continue PPI  History of seizure   Overall appears to be a failure to thrive.  She has lost upwards of 60lbs per family.  Husband drinks and does not seem to be able to manage at home.  PT/OT consult and palliative care consult.     Family Communication/Anticipated D/C date and plan/Code Status   DVT prophylaxis: scd Code Status: Partial Family Communication: Spoke with sister who is  her medical power of attorney on the phone 1/16 Disposition Plan: Status is: Inpatient  Remains inpatient appropriate because:IV treatments appropriate due to intensity of illness or inability to take PO and Inpatient level of care appropriate due to  severity of illness   Dispo: The patient is from: Home              Anticipated d/c is to: tbd              Anticipated d/c date is: 2-3 days              Patient currently is not medically stable to d/c.         Medical Consultants:    palliative care  PCCM (thoracentesis)  Subjective:   C/o nausea  Objective:    Vitals:   10/02/20 0000 10/02/20 0014 10/02/20 0353 10/02/20 0400  BP: 103/60 103/60  105/60  Pulse: 60 60  60  Resp:  20    Temp: 97.8 F (36.6 C) 97.8 F (36.6 C)  97.8 F (36.6 C)  TempSrc: Axillary   Axillary  SpO2: 97%   97%  Weight:   64.3 kg   Height:        Intake/Output Summary (Last 24 hours) at 10/02/2020 0841 Last data filed at 10/02/2020 0555 Gross per 24 hour  Intake 393 ml  Output 900 ml  Net -507 ml   Filed Weights   09/30/20 0900 10/02/20 0353  Weight: 64.6 kg 64.3 kg    Exam:  General: Appearance:    elderly female who appears uncomfrtable     Lungs:     On [place], diminished, no wheezing, respirations unlabored  Heart:    Normal heart rate.   MS:   All extremities are intact. Min LE Edema  Neurologic:   Awake, alert, answers most questions appropriately       Data Reviewed:   I have personally reviewed following labs and imaging studies:  Labs: Labs show the following:   Basic Metabolic Panel: Recent Labs  Lab 09/28/20 0500 09/29/20 0120 09/30/20 0318 10/01/20 0310 10/02/20 0750  NA 135 135 136 137 134*  K 3.9 5.0 3.5 3.6 4.0  CL 101 99 96* 97* 96*  CO2 24 25 30 30 27   GLUCOSE 139* 76 91 115* 110*  BUN 16 17 15 18 20   CREATININE 1.46* 1.42* 1.55* 1.81* 1.80*  CALCIUM 8.4* 8.2* 8.4* 8.2* 8.2*   GFR Estimated Creatinine Clearance: 23.7 mL/min (A) (by C-G formula based on SCr of 1.8 mg/dL (H)). Liver Function Tests: Recent Labs  Lab 09/27/20 1448 09/28/20 0500  AST 18 15  ALT 16 12  ALKPHOS 87 67  BILITOT 2.9* 2.0*  PROT 6.9 5.7*  ALBUMIN 3.5 2.8*   No results for input(s): LIPASE, AMYLASE in  the last 168 hours. No results for input(s): AMMONIA in the last 168 hours. Coagulation profile No results for input(s): INR, PROTIME in the last 168 hours.  CBC: Recent Labs  Lab 09/27/20 1448 09/28/20 0500 09/29/20 0120 09/30/20 0318 10/01/20 0310  WBC 5.3 4.2 3.5* 3.1* 3.8*  HGB 14.6 12.8 12.5 12.9 11.8*  HCT 47.4* 42.1 41.5 39.8 36.1  MCV 99.0 97.9 97.6 94.3 95.0  PLT PLATELET CLUMPS NOTED ON SMEAR, UNABLE TO ESTIMATE 94* 96* 93* 102*   Cardiac Enzymes: No results for input(s): CKTOTAL, CKMB, CKMBINDEX, TROPONINI in the last 168 hours. BNP (last 3 results) No results for input(s): PROBNP in the last 8760 hours. CBG: Recent Labs  Lab 10/01/20 0836 10/01/20 1210 10/01/20 1720 10/01/20 2103 10/02/20 0750  GLUCAP 205* 82 112* 120* 99   D-Dimer: No results for input(s): DDIMER in the last 72 hours. Hgb A1c: No results for input(s): HGBA1C in the last 72 hours. Lipid Profile: No results for input(s): CHOL, HDL, LDLCALC, TRIG, CHOLHDL, LDLDIRECT in the last 72 hours. Thyroid function studies: No results for input(s): TSH, T4TOTAL, T3FREE, THYROIDAB in the last 72 hours.  Invalid input(s): FREET3 Anemia work up: No results for input(s): VITAMINB12, FOLATE, FERRITIN, TIBC, IRON, RETICCTPCT in the last 72 hours. Sepsis Labs: Recent Labs  Lab 09/28/20 0500 09/29/20 0120 09/30/20 0318 10/01/20 0310  WBC 4.2 3.5* 3.1* 3.8*    Microbiology No results found for this or any previous visit (from the past 240 hour(s)).  Procedures and diagnostic studies:  CT CHEST WO CONTRAST  Result Date: 10/01/2020 CLINICAL DATA:  Dyspnea. EXAM: CT CHEST WITHOUT CONTRAST TECHNIQUE: Multidetector CT imaging of the chest was performed following the standard protocol without IV contrast. COMPARISON:  Radiograph of same day. FINDINGS: Cardiovascular: Atherosclerosis of thoracic aorta is noted without aneurysm formation. Mild cardiomegaly is noted. Calcifications are seen involving the  dilated left ventricle. Extensive coronary artery calcifications are noted. Left-sided pacemaker is noted. No pericardial effusion is noted. Mediastinum/Nodes: No enlarged mediastinal or axillary lymph nodes. Thyroid gland, trachea, and esophagus demonstrate no significant findings. Lungs/Pleura: No pneumothorax is noted. Moderate to large bilateral pleural effusions are noted with associated atelectasis of both lower lobes. Upper Abdomen: No acute abnormality. Musculoskeletal: No chest wall mass or suspicious bone lesions identified. IMPRESSION: 1. Moderate to large bilateral pleural effusions are noted with associated atelectasis of both lower lobes. 2. Extensive coronary artery calcifications are noted suggesting coronary artery disease. 3. Aortic atherosclerosis. Aortic Atherosclerosis (ICD10-I70.0). Electronically Signed   By: Marijo Conception M.D.   On: 10/01/2020 15:12    Medications:   . amiodarone  200 mg Oral Daily  . aspirin EC  81 mg Oral Daily  . atorvastatin  80 mg Oral QHS  . carvedilol  6.25 mg Oral BID WC  . clopidogrel  75 mg Oral Daily  . feeding supplement  237 mL Oral BID BM  . insulin aspart  0-15 Units Subcutaneous TID WC  . insulin aspart  0-5 Units Subcutaneous QHS  . levothyroxine  100 mcg Oral QAC breakfast  . multivitamin with minerals  1 tablet Oral Daily  . ranolazine  500 mg Oral Daily  . sacubitril-valsartan  1 tablet Oral BID  . sertraline  50 mg Oral Daily  . sodium chloride flush  3 mL Intravenous Q12H  . zolpidem  5 mg Oral QHS   Continuous Infusions:   LOS: 4 days   Geradine Girt  [facility]   How to contact the [facility] Attending or Consulting provider [place] or covering provider during after hours [place], for this patient?  1. Check the care team in [facility] and look for a) attending/consulting TRH provider listed and b) the [facility] team listed 2. Log into [email or web address] and use [place]'s universal password to access. If you do not have the  password, please contact the hospital operator. 3. Locate the [facility] provider you are looking for under [facility] and page to a number that you can be directly reached. 4. If you still have difficulty reaching the provider, please page the [facility] (Director on Call) for the Hospitalists listed on amion for assistance.  10/02/2020, 8:41 AM

## 2020-10-02 NOTE — Plan of Care (Signed)
  Problem: Education: Goal: Knowledge of General Education information will improve Description: Including pain rating scale, medication(s)/side effects and non-pharmacologic comfort measures Outcome: Progressing   Problem: Health Behavior/Discharge Planning: Goal: Ability to manage health-related needs will improve Outcome: Progressing   Problem: Clinical Measurements: Goal: Ability to maintain clinical measurements within normal limits will improve Outcome: Progressing Goal: Will remain free from infection Outcome: Progressing Goal: Diagnostic test results will improve Outcome: Progressing Goal: Respiratory complications will improve Outcome: Progressing Goal: Cardiovascular complication will be avoided Outcome: Progressing   Problem: Activity: Goal: Risk for activity intolerance will decrease Outcome: Progressing   Problem: Nutrition: Goal: Adequate nutrition will be maintained Outcome: Progressing   Problem: Coping: Goal: Level of anxiety will decrease Outcome: Progressing   Problem: Elimination: Goal: Will not experience complications related to bowel motility Outcome: Progressing Goal: Will not experience complications related to urinary retention Outcome: Progressing   Problem: Pain Managment: Goal: General experience of comfort will improve Outcome: Progressing   Problem: Safety: Goal: Ability to remain free from injury will improve Outcome: Progressing   Problem: Skin Integrity: Goal: Risk for impaired skin integrity will decrease Outcome: Progressing   Problem: Education: Goal: Ability to demonstrate management of disease process will improve Outcome: Progressing Goal: Ability to verbalize understanding of medication therapies will improve Outcome: Progressing Goal: Individualized Educational Video(s) Outcome: Progressing   Problem: Activity: Goal: Capacity to carry out activities will improve Outcome: Progressing   Problem: Cardiac: Goal:  Ability to achieve and maintain adequate cardiopulmonary perfusion will improve Outcome: Progressing   Problem: Education: Goal: Ability to demonstrate management of disease process will improve Outcome: Progressing Goal: Ability to verbalize understanding of medication therapies will improve Outcome: Progressing Goal: Individualized Educational Video(s) Outcome: Progressing   Problem: Activity: Goal: Capacity to carry out activities will improve Outcome: Progressing   Problem: Cardiac: Goal: Ability to achieve and maintain adequate cardiopulmonary perfusion will improve Outcome: Progressing   

## 2020-10-02 NOTE — Consult Note (Signed)
NAME:  Jenna West, MRN:  [id number], DOB:  1946/02/24, LOS: 4 ADMISSION DATE:  09/27/2020, CONSULTATION DATE:  10/02/20 REFERRING MD:  Eliseo Squires  CHIEF COMPLAINT:  Fatigue, Abd pain   Brief History   Jenna West is a 75 y.o. female who was admitted 1/13 with fatigue, abd pain, melena, FTT.   She had imaging that demonstrated bilateral pleural effusions, anasarca, ascites.  PCCM was asked to weigh in on effusions.  History of present illness   Jenna West is a 75 y.o. female who has a PMH as outlined below.  She presented to [facility] ED 1/13 with fatigue, abd pain, melena, FTT. She had imaging that demonstrated anasarca, ascites, and bilateral pleural effusions. She has apparently had upwards of 60lbs per [facility] notes (after discussions with family).  She tells me she has no hx of malignancy.  She says that on admission she had mild dyspnea but not enough for her to worry.  Currently denies any dyspnea.  She has been on room air with sats 92-95% but does desaturate to 87-88% with exertion.  She had echo 1/13 which demonstrated EF <20%, G2DD.  She was admitted by [facility] and PCCM was called 1/17 to weigh in on pleural effusions.  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 ([place]); Bilateral carotid artery stenosis; Cardiomyopathy ([place]); Carotid artery stenosis; Coronary artery disease involving native coronary artery of native heart with angina pectoris ([place]); Coronary artery disease of native artery of native heart with stable angina pectoris ([place]); ICD (implantable cardioverter-defibrillator) in place; Left heart failure ([place]); MI (myocardial infarction) ([place]); On amiodarone therapy; Pure hypercholesterolemia; Stage 3 chronic kidney disease ([place]); Syncope and collapse; Type 2 diabetes mellitus with stage 3 chronic kidney disease, with  long-term current use of insulin ([place]); VT (ventricular tachycardia) ([place]); Acute on chronic systolic CHF (congestive heart failure) ([place]); Hypertensive heart and kidney disease with HF and CKD ([place]); Heartburn; Ischemic cardiomyopathy; Coronary arteriosclerosis in native artery; Diarrhea, unspecified; and Seizure ([place]) on their problem list.  [facility] Events   1/13 > admit. 1/17 > PCCM consulted  Consults:  PCCM.  Procedures:  None.  Significant Diagnostic Tests:  Echo 1/13 > EF < 20%, G2DD. CT abd / pelv 1/12 > ascites, anasarca, large pleural effusions, diverticulosis. CT chest 1/16 > moderate to large bilateral pleural effusions with associated atelectasis, CAD.  Micro Data:   Antimicrobials:    Interim history/subjective:  Comfortable.  Denies dyspnea, cough, chest pain.  Feels that her overall edema has improved.  Objective:  Blood pressure (!) 107/55, pulse 70, temperature 98 F (36.7 C), temperature source Oral, resp. rate 18, height '5\' 4"'  (1.626 m), weight 64.3 kg, SpO2 98 %.        Intake/Output Summary (Last 24 hours) at 10/02/2020 1048 Last data filed at 10/02/2020 0555 Gross per 24 hour  Intake 393 ml  Output 900 ml  Net -507 ml   Filed Weights   09/30/20 0900 10/02/20 0353  Weight: 64.6 kg 64.3 kg    Examination: General: Elderly female, frail, in NAD. Neuro: A&O x 3, no deficits. HEENT: [place]/AT. Sclerae anicteric.  EOMI. Cardiovascular: RRR, no M/R/G.  Lungs: Respirations even and unlabored.  Diminished in bases. Abdomen: BS x 4, soft, NT/ND.  Musculoskeletal: No gross deformities, no edema.  Skin: Intact, warm, no rashes.  Assessment & Plan:   Combined  heart failure with exacerbation - echo with EF < 20% and G2DD. Hx VT, CAD, HLD. - Diuresis as able. - Albumin x 2 and assess response. - Continue home ASA, Plavix, Statin, Ranexa, Coreg.  Bilateral pleural effusions - 2/2 above. - Would defer thoracentesis for now as she is very  mildly symptomatic to asymptomatic and the fact that effusions will surely recur given her degree of heart failure and underlying malnutrition / failure to thrive. - Lasix and albumin as above.  Atelectasis. - Incentive spirometry. - Mobilize.  Failure to thrive. - Palliative care has been consulted which is appropriate.   Rest per primary team.  Nothing further to add.  PCCM will sign off.  Please do not hesitate to call us back if we can be of any further assistance.   Labs   CBC: Recent Labs  Lab 09/28/20 0500 09/29/20 0120 09/30/20 0318 10/01/20 0310 10/02/20 0750  WBC 4.2 3.5* 3.1* 3.8* 3.5*  HGB 12.8 12.5 12.9 11.8* 12.4  HCT 42.1 41.5 39.8 36.1 39.9  MCV 97.9 97.6 94.3 95.0 95.9  PLT 94* 96* 93* 102* 79*   Basic Metabolic Panel: Recent Labs  Lab 09/28/20 0500 09/29/20 0120 09/30/20 0318 10/01/20 0310 10/02/20 0750  NA 135 135 136 137 134*  K 3.9 5.0 3.5 3.6 4.0  CL 101 99 96* 97* 96*  CO2 '24 25 30 30 27  ' GLUCOSE 139* 76 91 115* 110*  BUN '16 17 15 18 20  ' CREATININE 1.46* 1.42* 1.55* 1.81* 1.80*  CALCIUM 8.4* 8.2* 8.4* 8.2* 8.2*   GFR: Estimated Creatinine Clearance: 23.7 mL/min (A) (by C-G formula based on SCr of 1.8 mg/dL (H)). Recent Labs  Lab 09/29/20 0120 09/30/20 0318 10/01/20 0310 10/02/20 0750  WBC 3.5* 3.1* 3.8* 3.5*   Liver Function Tests: Recent Labs  Lab 09/27/20 1448 09/28/20 0500  AST 18 15  ALT 16 12  ALKPHOS 87 67  BILITOT 2.9* 2.0*  PROT 6.9 5.7*  ALBUMIN 3.5 2.8*   No results for input(s): LIPASE, AMYLASE in the last 168 hours. No results for input(s): AMMONIA in the last 168 hours. ABG    Component Value Date/Time   TCO2 26 06/18/2020 1252    Coagulation Profile: No results for input(s): INR, PROTIME in the last 168 hours. Cardiac Enzymes: No results for input(s): CKTOTAL, CKMB, CKMBINDEX, TROPONINI in the last 168 hours. HbA1C: Hgb A1c MFr Bld  Date/Time Value Ref Range Status  06/18/2020 05:18 PM 6.6 (H) 4.8 -  5.6 % Final    Comment:    (NOTE) Pre diabetes:          5.7%-6.4%  Diabetes:              >6.4%  Glycemic control for   <7.0% adults with diabetes    CBG: Recent Labs  Lab 10/01/20 0836 10/01/20 1210 10/01/20 1720 10/01/20 2103 10/02/20 0750  GLUCAP 205* 82 112* 120* 99    Review of Systems:   All negative; except for those that are bolded, which indicate positives.  Constitutional: weight loss, weight gain, night sweats, fevers, chills, fatigue, weakness.  HEENT: headaches, sore throat, sneezing, nasal congestion, post nasal drip, difficulty swallowing, tooth/dental problems, visual complaints, visual changes, ear aches. Neuro: difficulty with speech, weakness, numbness, ataxia. CV:  chest pain, orthopnea, PND, swelling in lower extremities, dizziness, palpitations, syncope.  Resp: cough, hemoptysis, dyspnea, wheezing. GI: heartburn, indigestion, abdominal pain, nausea, vomiting, diarrhea, constipation, change in bowel habits, loss of appetite, hematemesis, melena, hematochezia.  GU: dysuria, change in color of urine, urgency or frequency, flank pain, hematuria. MSK: joint pain or swelling, decreased range of motion. Psych: change in mood or affect, depression, anxiety, suicidal ideations, homicidal ideations. Skin: rash, itching, bruising.   Past medical history  She,  has a past medical history of Acquired hypothyroidism (05/01/2015), AICD (automatic cardioverter/defibrillator) present, Angina pectoris ([place]) (07/13/2015), Aspirin long-term use (03/07/2016), Benign essential hypertension (05/01/2015), Bilateral carotid artery stenosis (03/07/2016), Cardiomyopathy ([place]) (05/01/2015), Carotid artery stenosis (41/10/8784), Chronic systolic heart failure ([place]) (05/28/2017), Coronary arteriosclerosis in native artery (05/01/2015), Coronary artery disease involving native coronary artery of native heart with angina pectoris ([place]) (05/01/2015), Coronary artery disease of native artery of  native heart with stable angina pectoris ([place]) (05/01/2015), Heartburn (05/29/2017), 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 ([place]) (05/01/2015), Hypertensive heart and kidney disease with HF and CKD ([place]) (05/28/2017), ICD (implantable cardioverter-defibrillator) in place (05/01/2015), Ischemic cardiomyopathy (06/04/2017), Left heart failure ([place]) (05/01/2015), MI (myocardial infarction) ([place]) (05/01/2015), On amiodarone therapy (08/15/2016), Pure hypercholesterolemia (05/01/2015), Rectal bleed (09/2020), Stage 3 chronic kidney disease ([place]) (12/18/2016), Syncope and collapse (05/01/2015), Type 2 diabetes mellitus with stage 3 chronic kidney disease, with long-term current use of insulin ([place]) (07/17/2016), and VT (ventricular tachycardia) ([place]) (05/01/2015).   Surgical History    Past Surgical History:  Procedure Laterality Date  . ABDOMINAL HYSTERECTOMY    . ANKLE FRACTURE SURGERY Bilateral    6 months apart, screws  . CARDIAC CATHETERIZATION     2003, 2009, 2016  . CAROTID STENT  06/21/2015   thoracic ao, 4 vessel cerebral arteriogram & rt internal carotid artery pta/stent CPT 76720  . CATARACT EXTRACTION Right    With lens replaced  . COLONOSCOPY  2013  . CORONARY STENT PLACEMENT    . PACEMAKER PLACEMENT     has two, one working and one not, could not remove old one, Dr. Elonda Husky      Social History   reports that she has never smoked. She has never used smokeless tobacco. She reports that she does not drink alcohol and does not use drugs.   Family history   Her family history includes Brain cancer in her mother; Breast cancer in her sister; Diabetes in her sister; Heart attack in her father; Heart disease in her father; Rectal cancer in her daughter.   Allergies Allergies  Allergen Reactions  . Shellfish Allergy Swelling    Of mouth only  . Sulfa Antibiotics Other (See Comments)    Internal bleeding     Home meds   Prior to Admission medications   Medication Sig Start Date End Date Taking? Authorizing Provider  amiodarone (PACERONE) 200 MG tablet Take 1 tablet (200 mg total) by mouth daily. 08/15/20  Yes Camnitz, Will Hassell Done, MD  aspirin EC 81 MG tablet Take 81 mg by mouth daily.   Yes [provider]  atorvastatin (LIPITOR) 80 MG tablet TAKE 1 TABLET BY MOUTH  DAILY Patient taking differently: Take 80 mg by mouth at bedtime. 05/29/20  Yes Richardo Priest, MD  carvedilol (COREG) 6.25 MG tablet TAKE 1 TABLET BY MOUTH  TWICE DAILY Patient taking differently: Take 6.25 mg by mouth 2 (two) times daily with a meal. 05/24/20  Yes Camnitz, Will Hassell Done, MD  clopidogrel (PLAVIX) 75 MG tablet TAKE 1 TABLET BY MOUTH  DAILY Patient taking differently: Take 75 mg by mouth daily. 05/29/20  Yes Richardo Priest, MD  furosemide (LASIX) 20 MG tablet  TAKE 1 TABLET BY MOUTH  DAILY Patient taking differently: Take 20 mg by mouth daily. 03/28/20  Yes Richardo Priest, MD  HUMALOG 100 UNIT/ML injection Inject 12 Units into the skin daily.  05/25/17  Yes [provider]  insulin glargine (LANTUS) 100 UNIT/ML injection Inject 10 Units into the skin daily.   Yes [provider]  levothyroxine (SYNTHROID, LEVOTHROID) 100 MCG tablet Take 100 mcg by mouth daily before breakfast.  07/02/17  Yes [provider]  Multiple Vitamins-Minerals (MULTI FOR HER) TABS Take 1 tablet by mouth daily.   Yes [provider]  nitroGLYCERIN (NITROLINGUAL) 0.4 MG/SPRAY spray Place 1 spray under the tongue every 5 (five) minutes x 3 doses as needed for chest pain. 04/24/20  Yes Richardo Priest, MD  pantoprazole (PROTONIX) 20 MG tablet Take 20 mg by mouth daily. 08/23/20  Yes [provider]  ranolazine (RANEXA) 500 MG 12 hr tablet Take 500 mg by mouth daily.  11/07/15  Yes [provider]  sacubitril-valsartan (ENTRESTO) 24-26 MG Take 1 tablet by mouth 2 (two) times daily. 11/22/19  Yes Camnitz, Will  Hassell Done, MD  sertraline (ZOLOFT) 50 MG tablet Take 50 mg by mouth daily. 10/24/18  Yes [provider]  spironolactone (ALDACTONE) 25 MG tablet TAKE ONE-HALF TABLET BY  MOUTH DAILY Patient taking differently: Take 12.5 mg by mouth every other day. 05/29/20  Yes Richardo Priest, MD  Vitamin D, Ergocalciferol, (DRISDOL) 1.25 MG (50000 UT) CAPS capsule Take 50,000 Units by mouth every Wednesday.    Yes [provider]  Wheat Dextrin (BENEFIBER) POWD Take 1 Scoop by mouth daily. 08/23/20  Yes [provider]  zolpidem (AMBIEN) 10 MG tablet Take 10 mg by mouth at bedtime. 07/31/19  Yes [provider]  Blood Glucose Monitoring Suppl (ACCU-CHEK AVIVA PLUS) w/Device KIT Apply 1 Dose topically daily. 04/07/19   [provider]     Montey Hora, [place] For pager details, please see AMION 10/02/2020, 10:48 AM

## 2020-10-02 NOTE — Progress Notes (Signed)
Physical Therapy Treatment Patient Details Name: Jenna West MRN: [id number] DOB: 10/27/1945 Today's Date: 10/02/2020    History of Present Illness Pt adm with acute on chronic systolic heart failure. PMH - DM, ckd, MI, ICD, ankle fx    PT Comments    Pt still with low BP but able to tolerate amb and BP with incr with standing.   Orthostatic BPs  Supine 84/53  Sitting 88/49  Standing 10359  Sitting after amb in hall 92/60      Follow Up Recommendations  Home health PT;Supervision for mobility/OOB     Equipment Recommendations  None recommended by PT    Recommendations for Other Services       Precautions / Restrictions Precautions Precautions: Fall;Other (comment) Precaution Comments: watch BP    Mobility  Bed Mobility Overal bed mobility: Needs Assistance Bed Mobility: Supine to Sit     Supine to sit: HOB elevated;Min guard     General bed mobility comments: Assist for safety and lines  Transfers Overall transfer level: Needs assistance Equipment used: Rolling walker (2 wheeled) Transfers: Sit to/from Stand Sit to Stand: Min guard         General transfer comment: Assist for safety and lines  Ambulation/Gait Ambulation/Gait assistance: Min guard Gait Distance (Feet): 125 Feet Assistive device: 4-wheeled walker Gait Pattern/deviations: Step-through pattern;Decreased stride length Gait velocity: decreased Gait velocity interpretation: <1.31 ft/sec, indicative of household ambulator General Gait Details: Assist for Therapist, music    Modified Rankin (Stroke Patients Only)       Balance Overall balance assessment: Needs assistance;History of Falls Sitting-balance support: Feet supported;No upper extremity supported Sitting balance-Leahy Scale: Good     Standing balance support: During functional activity Standing balance-Leahy Scale: Poor Standing balance comment: rollator and min  guard for static standing                            Cognition Arousal/Alertness: Awake/alert Behavior During Therapy: WFL for tasks assessed/performed Overall Cognitive Status: Impaired/Different from baseline Area of Impairment: Orientation;Memory;Following commands;Safety/judgement;Awareness;Problem solving                 Orientation Level: Time;Situation   Memory: Decreased short-term memory Following Commands: Follows one step commands consistently Safety/Judgement: Decreased awareness of safety Awareness: Emergent Problem Solving: Requires verbal cues;Slow processing        Exercises      General Comments General comments (skin integrity, edema, etc.): See assessment for BP      Pertinent Vitals/Pain Pain Assessment: No/denies pain    Home Living                      Prior Function            PT Goals (current goals can now be found in the care plan section) Acute Rehab PT Goals Patient Stated Goal: to get better and go home Progress towards PT goals: Progressing toward goals    Frequency    Min 3X/week      PT Plan Current plan remains appropriate    Co-evaluation              AM-PAC PT "6 Clicks" Mobility   Outcome Measure  Help needed turning from your back to your side while in a flat bed without using bedrails?: None Help needed moving from lying on  your back to sitting on the side of a flat bed without using bedrails?: A Little Help needed moving to and from a bed to a chair (including a wheelchair)?: A Little Help needed standing up from a chair using your arms (e.g., wheelchair or bedside chair)?: A Little Help needed to walk in hospital room?: A Little Help needed climbing 3-5 steps with a railing? : A Little 6 Click Score: 19    End of Session Equipment Utilized During Treatment: Gait belt Activity Tolerance: Patient limited by fatigue Patient left: with call bell/phone within reach;in chair;with chair  alarm set Nurse Communication: Mobility status;Other (comment) (BP) PT Visit Diagnosis: Muscle weakness (generalized) (M62.81)     Time: 2919-1660 PT Time Calculation (min) (ACUTE ONLY): 36 min  Charges:  $Gait Training: 23-37 mins                     [place] Pager [phone] [place] 10/02/2020, 4:54 PM

## 2020-10-03 DIAGNOSIS — I5023 Acute on chronic systolic (congestive) heart failure: Secondary | ICD-10-CM | POA: Diagnosis not present

## 2020-10-03 LAB — BASIC METABOLIC PANEL
Anion gap: 10 (ref 5–15)
BUN: 23 mg/dL (ref 8–23)
CO2: 29 mmol/L (ref 22–32)
Calcium: 8.2 mg/dL — ABNORMAL LOW (ref 8.9–10.3)
Chloride: 94 mmol/L — ABNORMAL LOW (ref 98–111)
Creatinine, Ser: 2.13 mg/dL — ABNORMAL HIGH (ref 0.44–1.00)
GFR, Estimated: 24 mL/min — ABNORMAL LOW (ref >60)
Glucose, Bld: 101 mg/dL — ABNORMAL HIGH (ref 70–99)
Potassium: 4 mmol/L (ref 3.5–5.1)
Sodium: 133 mmol/L — ABNORMAL LOW (ref 135–145)

## 2020-10-03 LAB — GLUCOSE, CAPILLARY
Glucose-Capillary: 103 mg/dL — ABNORMAL HIGH (ref 70–99)
Glucose-Capillary: 126 mg/dL — ABNORMAL HIGH (ref 70–99)

## 2020-10-03 MED ORDER — FUROSEMIDE 20 MG PO TABS: ORAL_TABLET | ORAL | 3 refills | Status: DC

## 2020-10-03 NOTE — Discharge Summary (Signed)
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  Diet recommendation: Low sodium, heart healthy.  Wound care: None.  Code status: partial   History of Present Illness:   Jenna West is a 75 y.o. female with medical history significant of hypothyroidism, carotid artery disease, systolic heart failure, CAD, GERD, hypertension, seizure, CKD 3, diabetes, hyperlipidemia, IBS who presents with abdominal pain, rectal bleeding and fatigue.             Patient reports 1 month of abdominal pain, 5 days of bloody stools and some ongoing fatigue.  She states she has chronic fatigue but the fatigue has been worse recently.  She also reports some worsening normal diarrhea for the last couple days.  Upon further questioning she also endorses some shortness of breath, orthopnea and has had some increased lower extremity edema.  Her  sister accompanies her and states that she does have intermittent episodes of confusion of unclear etiology.  She has been taking all her medications as prescribed other than taking her spironolactone every other day instead of daily.  No change in diet.  Patient denies fever, chills, cough.  ED Course: Vitals in ED significant for intermittent tachypnea in the low 20s.  Lab work-up showed BMP with sodium 134, bicarb 20, gap 16, creatinine 1.47 which is stable, glucose 171.  LFTs showed T bili and at 2.9.  CBC was normal other than clumps for platelets which need repeating.  BNP 2539.  Respiratory panel for flu and COVID-negative.  Type and screen performed.  FOBT pending.  Imaging showed chest x-ray with cardiomegaly vascular congestion and small to medium moderate effusions as well as basilar atelectasis versus less likely pneumonia.  CT abdomen pelvis showed no intra-abdominal abnormality other than ascites also demonstrated anasarca and large pleural effusions.  Patient started on IV Lasix in ED.   Hospital Course by Problem:   Acute on chronic systolic heart failure exacerbationacute >Last echo in 2020 with EF less than 20%, G1DD.Status post AICD- repeat echo similar to prior >Generalized fatigue recently, edema on exam and imaging, bilateral pleural effusions and ascities, BNP 2539 -U/S of ab with mild ascities X ray: Interval worsening opacification over the right mid to lower lung with stable left base opacification. Findings may be due to infection or atelectasis. Small bilateral pleural effusions right greater than left. -Continue home Coreg, Entresto   Acute respiratory failure with hypoxia due to b/l pleural affusions -x ray shows: Interval worsening opacification over  the right mid to lower lung with stable left base opacification. Findings may be due to infection or atelectasis. Small bilateral pleural effusions right greater than left. - ct scan of chest:Moderate to large  bilateral pleural effusions are noted with associated atelectasis of both lower lobes. -PCCM consult: Bilateral pleural effusions due to advanced heart failure complicated by CKD> recommend diuresis as able.  As stated in previous notes, we feel that pursuing palliative care is in her best interest giving advanced nature of CHF, CKD and dementia.  Diarrhea >Increased diarrhea recently,some bloody stools reported -hgb stable -no sign of bleeding -per chart review she is not a candidate for EGD/colonscopy (Dr. Paulita Fujita)  Hx ofVentricular tachycardia -AICD in place -Continue amiodarone  CAD Hyperlipidemia >History of MI.No cath in chart. -Continue home aspirin, Plavix, statin, Ranexa, Coreg  Hypothyroidism -Continue Synthroid  Diabetes -SSI -hold lantus for now  CKD 3b >Creatinine stable  -Avoid nephrotoxic agents -Trend renal function and electrolytes  dementia >Patient's sister reports that she gets confused every now and then it seems to be related to her p.o. intake. -Delirium precautions -has neurology follow up outpatient already  GERD -Continue PPI     Medical Consultants:   Palliative care PCCM for possible thoracentesis   Discharge Exam:   Vitals:   10/03/20 0500 10/03/20 0910  BP: (!) 87/56 97/76  Pulse: 72 70  Resp: 16 20  Temp: 97.6 F (36.4 C) 97.7 F (36.5 C)  SpO2: 94% 99%   Vitals:   10/02/20 1711 10/02/20 2100 10/03/20 0500 10/03/20 0910  BP: (!) 88/70  (!) 87/56 97/76  Pulse: 70  72 70  Resp: '15 17 16 20  ' Temp: (!) 97 F (36.1 C) 97.8 F (36.6 C) 97.6 F (36.4 C) 97.7 F (36.5 C)  TempSrc: Axillary Oral Oral Oral  SpO2: 100% 97% 94% 99%  Weight:   67.9 kg   Height:        General exam: Appears calm and comfortable.   The results of significant diagnostics from this hospitalization (including imaging, microbiology, ancillary and laboratory) are listed below for reference.     Procedures and  Diagnostic Studies:   CT Abdomen Pelvis W Contrast  Result Date: 09/27/2020 CLINICAL DATA:  Abdominal pain with rectal bleeding EXAM: CT ABDOMEN AND PELVIS WITH CONTRAST TECHNIQUE: Multidetector CT imaging of the abdomen and pelvis was performed using the standard protocol following bolus administration of intravenous contrast. CONTRAST:  60m OMNIPAQUE IOHEXOL 300 MG/ML  SOLN COMPARISON:  03/29/2020 FINDINGS: LOWER CHEST: Large pleural effusions. HEPATOBILIARY: Large amount of ascites in the upper abdomen. No focal hepatic lesion. There is cholelithiasis without acute inflammation. PANCREAS: Normal pancreas. No ductal dilatation or peripancreatic fluid collection. SPLEEN: Normal. ADRENALS/URINARY TRACT: The adrenal glands are normal. No hydronephrosis, nephroureterolithiasis or solid renal mass. The urinary bladder is normal for degree of distention STOMACH/BOWEL: There is no hiatal hernia. Normal duodenal course and caliber. No small bowel dilatation or inflammation. Rectosigmoid diverticulosis without acute inflammation. Normal appendix. VASCULAR/LYMPHATIC: There is calcific atherosclerosis of the abdominal aorta. No lymphadenopathy. REPRODUCTIVE: Status post hysterectomy. No adnexal mass. MUSCULOSKELETAL. Multilevel degenerative disc disease and facet arthrosis. No bony spinal canal stenosis. OTHER: Anasarca IMPRESSION: 1. Upper abdominal ascites, anasarca and large pleural effusions, favoring volume overload. 2. Diverticulosis without acute inflammation. Aortic Atherosclerosis (ICD10-I70.0). Electronically Signed   By: KUlyses JarredM.D.   On: 09/27/2020 22:12   DG Chest Port 1 View  Result Date: 09/27/2020 CLINICAL DATA:  Diarrhea EXAM: PORTABLE CHEST 1 VIEW  COMPARISON:  06/18/2020, 11/14/2017 FINDINGS: Partially visualized sclerotic lesion in the left humerus. Left-sided pacing device as before. Small moderate bilateral pleural effusions and basilar airspace disease. Cardiomegaly with central vascular  congestion. Calcified left ventricular infarct. No pneumothorax IMPRESSION: 1. Cardiomegaly with vascular congestion and small to moderate bilateral effusions. 2. Basilar airspace disease, atelectasis versus pneumonia 3. Curvilinear calcification over left ventricular apex consistent with calcified infarct Electronically Signed   By: Donavan Foil M.D.   On: 09/27/2020 20:29   ECHOCARDIOGRAM COMPLETE  Result Date: 09/28/2020    ECHOCARDIOGRAM REPORT   Patient Name:   WEDNESDAY ERICSSON Date of Exam: 09/28/2020 Medical Rec #:  [id number]        Height:       64.0 in Accession #:    [id number]       Weight:       144.0 lb Date of Birth:  1946-01-28         BSA:          1.701 m Patient Age:    17 years         BP:           128/71 mmHg Patient Gender: F                HR:           70 bpm. Exam Location:  Inpatient Procedure: 2D Echo, Cardiac Doppler, Color Doppler and Intracardiac            Opacification Agent Indications:    CHF  History:        Patient has prior history of Echocardiogram examinations, most                 recent 08/10/2019. CHF and Cardiomyopathy, CAD and Previous                 Myocardial Infarction, Defibrillator, Carotid Disease,                 Signs/Symptoms:Shortness of Breath; Risk Factors:Hypertension                 and Diabetes. Pleural effusions.  Sonographer:    Dustin Flock Referring Phys: [id number] [place]  1. Left ventricular ejection fraction, by estimation, is <20%. The left ventricle has severely decreased function. The left ventricle demonstrates global hypokinesis. The left ventricular internal cavity size was moderately dilated. Left ventricular diastolic parameters are consistent with Grade II diastolic dysfunction (pseudonormalization). Elevated left atrial pressure.  2. Right ventricular systolic function is mildly reduced. The right ventricular size is normal. There is mildly elevated pulmonary artery systolic pressure. The estimated right  ventricular systolic pressure is 01.7 mmHg.  3. Right atrial size was mildly dilated.  4. The mitral valve is normal in structure. Mild mitral valve regurgitation.  5. Tricuspid valve regurgitation is mild to moderate.  6. The aortic valve is tricuspid. Aortic valve regurgitation is not visualized. No aortic stenosis is present.  7. The inferior vena cava is dilated in size with <50% respiratory variability, suggesting right atrial pressure of 15 mmHg. FINDINGS  Left Ventricle: Left ventricular ejection fraction, by estimation, is <20%. The left ventricle has severely decreased function. The left ventricle demonstrates global hypokinesis. Definity contrast agent was given IV to delineate the left ventricular endocardial borders. The left ventricular internal cavity size was moderately dilated. There is no left ventricular hypertrophy. Left ventricular diastolic parameters are consistent with Grade II diastolic dysfunction (pseudonormalization). Elevated left  atrial pressure. Right Ventricle: The right ventricular size is normal. Right vetricular wall thickness was not well visualized. Right ventricular systolic function is mildly reduced. There is mildly elevated pulmonary artery systolic pressure. The tricuspid regurgitant velocity is 2.60 m/s, and with an assumed right atrial pressure of 15 mmHg, the estimated right ventricular systolic pressure is 35.5 mmHg. Left Atrium: Left atrial size was normal in size. Right Atrium: Right atrial size was mildly dilated. Pericardium: There is no evidence of pericardial effusion. Mitral Valve: The mitral valve is normal in structure. Mild mitral valve regurgitation. Tricuspid Valve: The tricuspid valve is normal in structure. Tricuspid valve regurgitation is mild to moderate. Aortic Valve: The aortic valve is tricuspid. Aortic valve regurgitation is not visualized. No aortic stenosis is present. Pulmonic Valve: The pulmonic valve was grossly normal. Pulmonic valve  regurgitation is trivial. Aorta: The aortic root and ascending aorta are structurally normal, with no evidence of dilitation. Venous: The inferior vena cava is dilated in size with less than 50% respiratory variability, suggesting right atrial pressure of 15 mmHg. IAS/Shunts: The interatrial septum was not well visualized. Additional Comments: There is a small pleural effusion in the left lateral region.  LEFT VENTRICLE PLAX 2D LVIDd:         5.60 cm  Diastology LVIDs:         5.30 cm  LV e' medial:    3.37 cm/s LV PW:         1.00 cm  LV E/e' medial:  21.2 LV IVS:        1.10 cm  LV e' lateral:   5.87 cm/s LVOT diam:     2.00 cm  LV E/e' lateral: 12.2 LV SV:         28 LV SV Index:   16 LVOT Area:     3.14 cm  RIGHT VENTRICLE RV Basal diam:  3.80 cm RV S prime:     7.94 cm/s TAPSE (M-mode): 1.9 cm LEFT ATRIUM             Index       RIGHT ATRIUM           Index LA diam:        4.20 cm 2.47 cm/m  RA Area:     17.50 cm LA Vol (A2C):   53.2 ml 31.27 ml/m RA Volume:   49.10 ml  28.86 ml/m LA Vol (A4C):   50.6 ml 29.74 ml/m LA Biplane Vol: 53.1 ml 31.21 ml/m  AORTIC VALVE LVOT Vmax:   56.10 cm/s LVOT Vmean:  37.100 cm/s LVOT VTI:    0.089 m  AORTA Ao Root diam: 2.70 cm MITRAL VALVE               TRICUSPID VALVE MV Area (PHT): 8.92 cm    TR Peak grad:   27.0 mmHg MV Decel Time: 85 msec     TR Vmax:        260.00 cm/s MV E velocity: 71.60 cm/s MV A velocity: 80.10 cm/s  SHUNTS MV E/A ratio:  0.89        Systemic VTI:  0.09 m                            Systemic Diam: 2.00 cm Oswaldo Milian MD Electronically signed by Oswaldo Milian MD Signature Date/Time: 09/28/2020/11:27:39 AM    Final      Labs:   Basic Metabolic Panel: Recent Labs  Lab 09/29/20 0120 09/30/20 0318 10/01/20 0310 10/02/20 0750 10/03/20 0402  NA 135 136 137 134* 133*  K 5.0 3.5 3.6 4.0 4.0  CL 99 96* 97* 96* 94*  CO2 '25 30 30 27 29  ' GLUCOSE 76 91 115* 110* 101*  BUN '17 15 18 20 23  ' CREATININE 1.42* 1.55* 1.81* 1.80*  2.13*  CALCIUM 8.2* 8.4* 8.2* 8.2* 8.2*   GFR Estimated Creatinine Clearance: 21.9 mL/min (A) (by C-G formula based on SCr of 2.13 mg/dL (H)). Liver Function Tests: Recent Labs  Lab 09/27/20 1448 09/28/20 0500  AST 18 15  ALT 16 12  ALKPHOS 87 67  BILITOT 2.9* 2.0*  PROT 6.9 5.7*  ALBUMIN 3.5 2.8*   No results for input(s): LIPASE, AMYLASE in the last 168 hours. No results for input(s): AMMONIA in the last 168 hours. Coagulation profile No results for input(s): INR, PROTIME in the last 168 hours.  CBC: Recent Labs  Lab 09/28/20 0500 09/29/20 0120 09/30/20 0318 10/01/20 0310 10/02/20 0750  WBC 4.2 3.5* 3.1* 3.8* 3.5*  HGB 12.8 12.5 12.9 11.8* 12.4  HCT 42.1 41.5 39.8 36.1 39.9  MCV 97.9 97.6 94.3 95.0 95.9  PLT 94* 96* 93* 102* 79*   Cardiac Enzymes: No results for input(s): CKTOTAL, CKMB, CKMBINDEX, TROPONINI in the last 168 hours. BNP: Invalid input(s): POCBNP CBG: Recent Labs  Lab 10/02/20 0750 10/02/20 1133 10/02/20 1708 10/02/20 2129 10/03/20 0800  GLUCAP 99 149* 144* 131* 103*   D-Dimer No results for input(s): DDIMER in the last 72 hours. Hgb A1c No results for input(s): HGBA1C in the last 72 hours. Lipid Profile No results for input(s): CHOL, HDL, LDLCALC, TRIG, CHOLHDL, LDLDIRECT in the last 72 hours. Thyroid function studies No results for input(s): TSH, T4TOTAL, T3FREE, THYROIDAB in the last 72 hours.  Invalid input(s): FREET3 Anemia work up No results for input(s): VITAMINB12, FOLATE, FERRITIN, TIBC, IRON, RETICCTPCT in the last 72 hours. Microbiology No results found for this or any previous visit (from the past 240 hour(s)).   Discharge Instructions:   Discharge Instructions    (HEART FAILURE PATIENTS) Call MD:  Anytime you have any of the following symptoms: 1) 3 pound weight gain in 24 hours or 5 pounds in 1 week 2) shortness of breath, with or without a dry hacking cough 3) swelling in the hands, feet or stomach 4) if you have to  sleep on extra pillows at night in order to breathe.   Complete by: As directed    Diet - low sodium heart healthy   Complete by: As directed    Diet Carb Modified   Complete by: As directed    Heart Failure patients record your daily weight using the same scale at the same time of day   Complete by: As directed    Increase activity slowly   Complete by: As directed      Allergies as of 10/03/2020      Reactions   Shellfish Allergy Swelling   Of mouth only   Sulfa Antibiotics Other (See Comments)   Internal bleeding      Medication List    STOP taking these medications   HumaLOG 100 UNIT/ML injection Generic drug: insulin lispro   spironolactone 25 MG tablet Commonly known as: ALDACTONE     TAKE these medications   Accu-Chek Aviva Plus w/Device Kit Apply 1 Dose topically daily.   amiodarone 200 MG tablet Commonly known as: PACERONE Take 1 tablet (200 mg total) by mouth daily.  aspirin EC 81 MG tablet Take 81 mg by mouth daily.   atorvastatin 80 MG tablet Commonly known as: LIPITOR TAKE 1 TABLET BY MOUTH  DAILY What changed: when to take this   Benefiber Powd Take 1 Scoop by mouth daily.   carvedilol 6.25 MG tablet Commonly known as: COREG TAKE 1 TABLET BY MOUTH  TWICE DAILY What changed: when to take this   clopidogrel 75 MG tablet Commonly known as: PLAVIX TAKE 1 TABLET BY MOUTH  DAILY   Entresto 24-26 MG Generic drug: sacubitril-valsartan Take 1 tablet by mouth 2 (two) times daily.   furosemide 20 MG tablet Commonly known as: LASIX TAKE 1 TABLET BY MOUTH PRN lower ext edema or SOB What changed: additional instructions   insulin glargine 100 UNIT/ML injection Commonly known as: LANTUS Inject 10 Units into the skin daily.   levothyroxine 100 MCG tablet Commonly known as: SYNTHROID Take 100 mcg by mouth daily before breakfast.   Multi For Her Tabs Take 1 tablet by mouth daily.   nitroGLYCERIN 0.4 MG/SPRAY spray Commonly known as:  Nitrolingual Place 1 spray under the tongue every 5 (five) minutes x 3 doses as needed for chest pain.   pantoprazole 20 MG tablet Commonly known as: PROTONIX Take 20 mg by mouth daily.   ranolazine 500 MG 12 hr tablet Commonly known as: RANEXA Take 500 mg by mouth daily.   sertraline 50 MG tablet Commonly known as: ZOLOFT Take 50 mg by mouth daily.   Vitamin D (Ergocalciferol) 1.25 MG (50000 UNIT) Caps capsule Commonly known as: DRISDOL Take 50,000 Units by mouth every Wednesday.   zolpidem 10 MG tablet Commonly known as: AMBIEN Take 10 mg by mouth at bedtime.       Follow-up Information    [facility] Follow up.   Specialty: Home Health Services Why: Registered Nurse, Physical Therapy-office to call the patient with visit times.  Contact information: [place] [address] [phone]        Ronita Hipps, MD Follow up in 1 week(s).   Specialty: Family Medicine Contact information: [place] [address] [phone]        Constance Haw, MD .   Specialty: Cardiology Contact information: [place] [address] [phone]        Richardo Priest, MD .   Specialties: Cardiology, Radiology Contact information: [place] [address] [phone]                Time coordinating discharge: 35 min  Signed:  Geradine Girt DO  [facility] 10/03/2020, 10:36 AM

## 2020-10-03 NOTE — Evaluation (Signed)
Occupational Therapy Evaluation Patient Details Name: Jenna West MRN: [id number] DOB: 02/17/1946 Today's Date: 10/03/2020    History of Present Illness Pt adm with acute on chronic systolic heart failure. PMH - DM, ckd, MI, ICD, ankle fx   Clinical Impression   Pt PTA: Pt supervisionA overall for ADL; sister or spouse is home usually so pt is never left alone. Pt performing ADL routine at EOB in sitting/standing in preparation for going home. Pt performing ADL functional mobility with supervisionA and RW for stability. Pt with no LOB episodes. Pt with mild moments of short term memory deficits regarding her ride picking her up at d/c. Pt followed all commands. Pt  Pt >90% on 2-3L O2 with exertion. HR <100 BPM with exertion. No dizziness in standing. BP stable. Pt does not require continued OT skilled services. OT signing off.    Follow Up Recommendations  No OT follow up    Equipment Recommendations  None recommended by OT    Recommendations for Other Services       Precautions / Restrictions Precautions Precautions: Fall;Other (comment) Precaution Comments: watch BP Restrictions Weight Bearing Restrictions: No      Mobility Bed Mobility Overal bed mobility: Needs Assistance Bed Mobility: Supine to Sit;Sit to Supine     Supine to sit: Supervision Sit to supine: Supervision   General bed mobility comments: cues to scoot hips to middle of bed    Transfers Overall transfer level: Needs assistance Equipment used: Rolling walker (2 wheeled) Transfers: Sit to/from Stand Sit to Stand: Supervision         General transfer comment: no physical assist required    Balance Overall balance assessment: Needs assistance;History of Falls Sitting-balance support: Feet supported;No upper extremity supported       Standing balance support: During functional activity;No upper extremity supported Standing balance-Leahy Scale: Fair Standing balance comment: Pt pulling pants  to waist with no UE supported                           ADL either performed or assessed with clinical judgement   ADL Overall ADL's : At baseline                                       General ADL Comments: Pt supervisionA overall for ADL; sister or spouse is home usually so pt is never left alone. Pt performing ADL routine at EOB in sitting/standing in preparation for going home. Pt performing ADL functional mobility with supervisionA and RW for stability. Pt with no LOB episodes.     Vision Baseline Vision/History: Wears glasses;No visual deficits Wears Glasses: Reading only Patient Visual Report: No change from baseline Vision Assessment?: No apparent visual deficits     Perception     Praxis      Pertinent Vitals/Pain Pain Assessment: No/denies pain     Hand Dominance Right   Extremity/Trunk Assessment Upper Extremity Assessment Upper Extremity Assessment: Generalized weakness   Lower Extremity Assessment Lower Extremity Assessment: Generalized weakness   Cervical / Trunk Assessment Cervical / Trunk Assessment: Kyphotic   Communication Communication Communication: No difficulties   Cognition Arousal/Alertness: Awake/alert Behavior During Therapy: WFL for tasks assessed/performed Overall Cognitive Status: Impaired/Different from baseline Area of Impairment: Memory;Problem solving                     Memory: Decreased  short-term memory       Problem Solving: Requires verbal cues General Comments: Pt more aware of situation today and excited to be leaving today. Pt repeating herself wondering who is picking her up when the person on the  phone stated that her stepdaughter was taking her home, but she continued to ask. Pt following all commands.   General Comments  Pt >90% on 2-3L O2. HR <100 BPM with exertion. No dizziness in standing. BP stable.    Exercises     Shoulder Instructions      Home Living Family/patient  expects to be discharged to:: Private residence Living Arrangements: Spouse/significant other Available Help at Discharge: Family;Available 24 hours/day Type of Home: House Home Access: Stairs to enter [employer] of Steps: 1 Entrance Stairs-Rails: None Home Layout: Two level;Able to live on main level with bedroom/bathroom     Bathroom Shower/Tub: Teacher, early years/pre: Standard     Home Equipment: Grab bars - toilet;Grab bars - tub/shower;Hand held shower head;Walker - 4 wheels;Cane - single point;Bedside commode   Additional Comments: spouse name Rush Landmark has a beagle named bob ( 37 yo) and sister POA      Prior Functioning/Environment Level of Independence: Needs assistance  Gait / Transfers Assistance Needed: Uses rollator for ambulation as of recently; reports 1 fall in last 6 months- unsure of accuracy. ADL's / Homemaking Assistance Needed: Does own ADLs/IADLs, does not drive.            OT Problem List: Decreased activity tolerance      OT Treatment/Interventions:      OT Goals(Current goals can be found in the care plan section) Acute Rehab OT Goals OT Goal Formulation: All assessment and education complete, DC therapy Potential to Achieve Goals: Good  OT Frequency:     Barriers to D/C:            Co-evaluation              AM-PAC OT "6 Clicks" Daily Activity     Outcome Measure Help from another person eating meals?: None Help from another person taking care of personal grooming?: A Little Help from another person toileting, which includes using toliet, bedpan, or urinal?: A Little Help from another person bathing (including washing, rinsing, drying)?: A Little Help from another person to put on and taking off regular upper body clothing?: None Help from another person to put on and taking off regular lower body clothing?: A Little 6 Click Score: 20   End of Session Equipment Utilized During Treatment: Rolling walker Nurse  Communication: Mobility status  Activity Tolerance: Patient tolerated treatment well Patient left: in bed;with call bell/phone within reach;with bed alarm set  OT Visit Diagnosis: Unsteadiness on feet (R26.81)                Time: 0923-3007 OT Time Calculation (min): 25 min Charges:  OT General Charges $OT Visit: 1 Visit OT Evaluation $OT Eval Moderate Complexity: 1 Mod OT Treatments $Self Care/Home Management : 8-22 mins  Jefferey Pica, OTR/L Acute Rehabilitation Services Pager: [phone] Office: [phone]   Linna Thebeau C 10/03/2020, 1:43 PM

## 2020-10-03 NOTE — Progress Notes (Signed)
SATURATION QUALIFICATIONS: (This note is used to comply with regulatory documentation for home oxygen)  Patient Saturations on Room Air at Rest = 86%  Patient Saturations on Room Air while Ambulating = N/!%  Patient Saturations on 3L Liters of oxygen while Ambulating = 95%  Please briefly explain why patient needs home oxygen: Patient desats on RA when at rest to 86% - patient on 3L to keep sats >92%

## 2020-10-03 NOTE — Discharge Instructions (Signed)

## 2020-10-03 NOTE — Care Management Important Message (Signed)
Important Message  Patient Details  Name: Jenna West MRN: [id number] Date of Birth: September 01, 1946   Medicare Important Message Given:  Yes     [name] 10/03/2020, 11:23 AM

## 2020-10-03 NOTE — TOC Transition Note (Signed)
Transition of Care [facility]) - CM/SW Discharge Note   Patient Details  Name: PAMI WOOL MRN: [id number] Date of Birth: 1946-04-21  Transition of Care [facility]) CM/SW Contact:  Bethena Roys, RN Phone Number: 10/03/2020, 10:58 AM   Clinical Narrative:  Patient is agreeable to outpatient palliative care services. Case Manager reached out to the patients sister [name] with verbal permission from the patient to offer outpatient palliative service choice. Patient is in the [facility] network- call placed to [facility]. Office is able to service the patient in [place].[facility] is through [place]. [name] stated that the patients niece Claiborne Billings will transport the patient home via private vehicle. [facility] will follow the patient for home health needs. Staff RN ambulated the patient and she will need oxygen for home. Case Manager attempted to call  sister regarding oxygen and received voicemail. Case Manager then reached out to niece Claiborne Billings- stated it was okay to utilize [employer]. Referral sent to [employer] and DME 02 will be delivered to the room prior to transition home.     Final next level of care: [place] Barriers to Discharge: No Barriers Identified   Patient Goals and CMS Choice Patient states their goals for this hospitalization and ongoing recovery are:: to return home with home health and personal care services. CMS Medicare.gov Compare Post Acute Care list provided to:: Patient Choice offered to / list presented to : Patient   Discharge Plan and Services In-house Referral: Hospice / Palliative Care Discharge Planning Services: CM Consult Post Acute Care Choice: Home Health          DME Arranged: N/A DME Agency: NA       HH Arranged: RN,PT,Disease Management,Nurse's Aide (family and agency will assist with personal care services with getting that set up.) [place]: [facility] Date [place]: 09/29/20 Time HH Agency Contacted: 63 Representative spoke with at [place]: Amy  Social Determinants of Health ([place]) Interventions Food Insecurity Interventions: Intervention Not Indicated Financial Strain Interventions: Intervention Not Indicated Housing Interventions: Intervention Not Indicated Intimate Partner Violence Interventions: Intervention Not Indicated Transportation Interventions: Intervention Not Indicated   Readmission Risk Interventions No flowsheet data found.

## 2020-10-03 NOTE — Progress Notes (Signed)
[facility] PCCM  S: Feels OK, feels like breathing is O  O:  Vitals:   10/02/20 1711 10/02/20 2100 10/03/20 0500 10/03/20 0910  BP: (!) 88/70  (!) 87/56 97/76  Pulse: 70  72 70  Resp: 15 17 16 20   Temp: (!) 97 F (36.1 C) 97.8 F (36.6 C) 97.6 F (36.4 C) 97.7 F (36.5 C)  TempSrc: Axillary Oral Oral Oral  SpO2: 100% 97% 94% 99%  Weight:   67.9 kg   Height:       General:  Elderly female, resting comfortably in bed HENT: NCAT OP clear PULM: Crackles B, normal effort CV: RRR, no mgr, slight systolic murmur GI: BS+, soft, nontender MSK: normal bulk and tone Neuro: awake, alert, MAEW  A&P:  Bilateral pleural effusions due to advanced heart failure complicated by CKD> recommend diuresis as able.  As stated in previous notes, we feel that pursuing palliative care is in her best interest giving advanced nature of CHF, CKD and dementia.  PCCM available PRN  Roselie Awkward, MD [place] PCCM Pager: [phone] Cell: [phone] If no response, call [phone]

## 2020-10-04 ENCOUNTER — Encounter: Payer: Self-pay | Admitting: *Deleted

## 2020-10-04 NOTE — Progress Notes (Signed)
° ° °  Patient's initial visit is scheduled for Monday, 10/09/20  at patient's home.  This will be an introductory visit to [facility]'s palliative care division, [facility].  Will provide update regarding outcome of visit.  Ronnell Guadalajara Referral Specialist [phone]

## 2020-10-09 ENCOUNTER — Telehealth: Payer: Self-pay | Admitting: Cardiology

## 2020-10-09 NOTE — Telephone Encounter (Signed)
Called pharmacy left on their refill voicemail the patient information and the refill information. Left message for them to call with any questions.

## 2020-10-09 NOTE — Telephone Encounter (Signed)
*  STAT* If patient is at the pharmacy, call can be transferred to refill team.   1. Which medications need to be refilled? (please list name of each medication and dose if known)  ranolazine (RANEXA) 500 MG 12 hr tablet carvedilol (COREG) 6.25 MG tablet Zofran  2. Which pharmacy/location (including street and city if local pharmacy) is medication to be sent to? [place], [address]  3. Do they need a 30 day or 90 day supply? 30 with refills

## 2020-10-10 ENCOUNTER — Encounter: Payer: Self-pay | Admitting: *Deleted

## 2020-10-10 NOTE — Progress Notes (Signed)
° ° °  Pt was admitted to [facility]--the Home-Based Palliative Care division of [facility]--in the home on 10/09/20.  Pt will be receiving nurse visits 1-3 x/month and home-based social worker support as well.  Ronnell Guadalajara Referral Specialist [phone]

## 2020-10-11 ENCOUNTER — Inpatient Hospital Stay (HOSPITAL_COMMUNITY)
Admission: EM | Admit: 2020-10-11 | Discharge: 2020-10-17 | DRG: 177 | Disposition: A | Discharge status: Hospice | Payer: Medicare Other | Attending: Internal Medicine | Admitting: Internal Medicine

## 2020-10-11 ENCOUNTER — Emergency Department (HOSPITAL_COMMUNITY): Payer: Medicare Other

## 2020-10-11 ENCOUNTER — Other Ambulatory Visit: Payer: Self-pay

## 2020-10-11 DIAGNOSIS — Z7189 Other specified counseling: Secondary | ICD-10-CM | POA: Diagnosis not present

## 2020-10-11 DIAGNOSIS — J90 Pleural effusion, not elsewhere classified: Secondary | ICD-10-CM | POA: Diagnosis not present

## 2020-10-11 DIAGNOSIS — I130 Hypertensive heart and chronic kidney disease with heart failure and stage 1 through stage 4 chronic kidney disease, or unspecified chronic kidney disease: Secondary | ICD-10-CM | POA: Diagnosis not present

## 2020-10-11 DIAGNOSIS — I472 Ventricular tachycardia: Secondary | ICD-10-CM | POA: Diagnosis not present

## 2020-10-11 DIAGNOSIS — Z833 Family history of diabetes mellitus: Secondary | ICD-10-CM

## 2020-10-11 DIAGNOSIS — Z91013 Allergy to seafood: Secondary | ICD-10-CM

## 2020-10-11 DIAGNOSIS — Z794 Long term (current) use of insulin: Secondary | ICD-10-CM

## 2020-10-11 DIAGNOSIS — Z882 Allergy status to sulfonamides status: Secondary | ICD-10-CM

## 2020-10-11 DIAGNOSIS — I5084 End stage heart failure: Secondary | ICD-10-CM | POA: Diagnosis not present

## 2020-10-11 DIAGNOSIS — J1282 Pneumonia due to coronavirus disease 2019: Secondary | ICD-10-CM | POA: Diagnosis present

## 2020-10-11 DIAGNOSIS — N179 Acute kidney failure, unspecified: Secondary | ICD-10-CM | POA: Diagnosis not present

## 2020-10-11 DIAGNOSIS — I255 Ischemic cardiomyopathy: Secondary | ICD-10-CM | POA: Diagnosis present

## 2020-10-11 DIAGNOSIS — Z743 Need for continuous supervision: Secondary | ICD-10-CM | POA: Diagnosis not present

## 2020-10-11 DIAGNOSIS — L89152 Pressure ulcer of sacral region, stage 2: Secondary | ICD-10-CM | POA: Diagnosis present

## 2020-10-11 DIAGNOSIS — E7800 Pure hypercholesterolemia, unspecified: Secondary | ICD-10-CM | POA: Diagnosis present

## 2020-10-11 DIAGNOSIS — N1832 Chronic kidney disease, stage 3b: Secondary | ICD-10-CM | POA: Diagnosis not present

## 2020-10-11 DIAGNOSIS — L8990 Pressure ulcer of unspecified site, unspecified stage: Secondary | ICD-10-CM | POA: Insufficient documentation

## 2020-10-11 DIAGNOSIS — E039 Hypothyroidism, unspecified: Secondary | ICD-10-CM | POA: Diagnosis present

## 2020-10-11 DIAGNOSIS — F0390 Unspecified dementia without behavioral disturbance: Secondary | ICD-10-CM | POA: Diagnosis present

## 2020-10-11 DIAGNOSIS — Z79899 Other long term (current) drug therapy: Secondary | ICD-10-CM

## 2020-10-11 DIAGNOSIS — J9601 Acute respiratory failure with hypoxia: Secondary | ICD-10-CM

## 2020-10-11 DIAGNOSIS — Z515 Encounter for palliative care: Secondary | ICD-10-CM

## 2020-10-11 DIAGNOSIS — Z79890 Hormone replacement therapy (postmenopausal): Secondary | ICD-10-CM

## 2020-10-11 DIAGNOSIS — U071 COVID-19: Principal | ICD-10-CM | POA: Diagnosis present

## 2020-10-11 DIAGNOSIS — D72819 Decreased white blood cell count, unspecified: Secondary | ICD-10-CM | POA: Diagnosis present

## 2020-10-11 DIAGNOSIS — D696 Thrombocytopenia, unspecified: Secondary | ICD-10-CM | POA: Diagnosis not present

## 2020-10-11 DIAGNOSIS — I2510 Atherosclerotic heart disease of native coronary artery without angina pectoris: Secondary | ICD-10-CM | POA: Diagnosis present

## 2020-10-11 DIAGNOSIS — Z955 Presence of coronary angioplasty implant and graft: Secondary | ICD-10-CM

## 2020-10-11 DIAGNOSIS — R6889 Other general symptoms and signs: Secondary | ICD-10-CM | POA: Diagnosis not present

## 2020-10-11 DIAGNOSIS — E872 Acidosis, unspecified: Secondary | ICD-10-CM

## 2020-10-11 DIAGNOSIS — I5023 Acute on chronic systolic (congestive) heart failure: Secondary | ICD-10-CM | POA: Diagnosis not present

## 2020-10-11 DIAGNOSIS — Z95810 Presence of automatic (implantable) cardiac defibrillator: Secondary | ICD-10-CM | POA: Diagnosis not present

## 2020-10-11 DIAGNOSIS — J9602 Acute respiratory failure with hypercapnia: Secondary | ICD-10-CM | POA: Diagnosis not present

## 2020-10-11 DIAGNOSIS — F32A Depression, unspecified: Secondary | ICD-10-CM | POA: Diagnosis not present

## 2020-10-11 DIAGNOSIS — R569 Unspecified convulsions: Secondary | ICD-10-CM

## 2020-10-11 DIAGNOSIS — Z66 Do not resuscitate: Secondary | ICD-10-CM

## 2020-10-11 DIAGNOSIS — E1122 Type 2 diabetes mellitus with diabetic chronic kidney disease: Secondary | ICD-10-CM | POA: Diagnosis present

## 2020-10-11 DIAGNOSIS — K589 Irritable bowel syndrome without diarrhea: Secondary | ICD-10-CM | POA: Diagnosis present

## 2020-10-11 DIAGNOSIS — Z8249 Family history of ischemic heart disease and other diseases of the circulatory system: Secondary | ICD-10-CM

## 2020-10-11 DIAGNOSIS — Z789 Other specified health status: Secondary | ICD-10-CM

## 2020-10-11 DIAGNOSIS — E1165 Type 2 diabetes mellitus with hyperglycemia: Secondary | ICD-10-CM | POA: Diagnosis not present

## 2020-10-11 DIAGNOSIS — Z7902 Long term (current) use of antithrombotics/antiplatelets: Secondary | ICD-10-CM

## 2020-10-11 DIAGNOSIS — Z7982 Long term (current) use of aspirin: Secondary | ICD-10-CM

## 2020-10-11 DIAGNOSIS — I213 ST elevation (STEMI) myocardial infarction of unspecified site: Secondary | ICD-10-CM | POA: Diagnosis not present

## 2020-10-11 DIAGNOSIS — J810 Acute pulmonary edema: Secondary | ICD-10-CM | POA: Diagnosis not present

## 2020-10-11 DIAGNOSIS — R0602 Shortness of breath: Secondary | ICD-10-CM | POA: Diagnosis not present

## 2020-10-11 DIAGNOSIS — I429 Cardiomyopathy, unspecified: Secondary | ICD-10-CM

## 2020-10-11 DIAGNOSIS — I252 Old myocardial infarction: Secondary | ICD-10-CM

## 2020-10-11 DIAGNOSIS — I959 Hypotension, unspecified: Secondary | ICD-10-CM | POA: Diagnosis not present

## 2020-10-11 DIAGNOSIS — I499 Cardiac arrhythmia, unspecified: Secondary | ICD-10-CM | POA: Diagnosis not present

## 2020-10-11 LAB — COMPREHENSIVE METABOLIC PANEL
ALT: 16 U/L (ref 0–44)
AST: 27 U/L (ref 15–41)
Albumin: 2.7 g/dL — ABNORMAL LOW (ref 3.5–5.0)
Alkaline Phosphatase: 73 U/L (ref 38–126)
Anion gap: 14 (ref 5–15)
BUN: 32 mg/dL — ABNORMAL HIGH (ref 8–23)
CO2: 23 mmol/L (ref 22–32)
Calcium: 7.8 mg/dL — ABNORMAL LOW (ref 8.9–10.3)
Chloride: 97 mmol/L — ABNORMAL LOW (ref 98–111)
Creatinine, Ser: 2.65 mg/dL — ABNORMAL HIGH (ref 0.44–1.00)
GFR, Estimated: 18 mL/min — ABNORMAL LOW (ref >60)
Glucose, Bld: 199 mg/dL — ABNORMAL HIGH (ref 70–99)
Potassium: 4.1 mmol/L (ref 3.5–5.1)
Sodium: 134 mmol/L — ABNORMAL LOW (ref 135–145)
Total Bilirubin: 2.1 mg/dL — ABNORMAL HIGH (ref 0.3–1.2)
Total Protein: 5.4 g/dL — ABNORMAL LOW (ref 6.5–8.1)

## 2020-10-11 LAB — URINALYSIS, ROUTINE W REFLEX MICROSCOPIC
Bilirubin Urine: NEGATIVE
Glucose, UA: NEGATIVE mg/dL
Ketones, ur: NEGATIVE mg/dL
Leukocytes,Ua: NEGATIVE
Nitrite: NEGATIVE
Protein, ur: 30 mg/dL — AB
Specific Gravity, Urine: 1.012 (ref 1.005–1.030)
pH: 5 (ref 5.0–8.0)

## 2020-10-11 LAB — TRIGLYCERIDES: Triglycerides: 55 mg/dL (ref <150)

## 2020-10-11 LAB — BRAIN NATRIURETIC PEPTIDE: B Natriuretic Peptide: 2652.6 pg/mL — ABNORMAL HIGH (ref 0.0–100.0)

## 2020-10-11 LAB — FIBRINOGEN: Fibrinogen: 181 mg/dL — ABNORMAL LOW (ref 210–475)

## 2020-10-11 LAB — CBC WITH DIFFERENTIAL/PLATELET
Abs Immature Granulocytes: 0.02 10*3/uL (ref 0.00–0.07)
Basophils Absolute: 0 10*3/uL (ref 0.0–0.1)
Basophils Relative: 1 %
Eosinophils Absolute: 0 10*3/uL (ref 0.0–0.5)
Eosinophils Relative: 1 %
HCT: 43.2 % (ref 36.0–46.0)
Hemoglobin: 13.1 g/dL (ref 12.0–15.0)
Immature Granulocytes: 1 %
Lymphocytes Relative: 11 %
Lymphs Abs: 0.4 10*3/uL — ABNORMAL LOW (ref 0.7–4.0)
MCH: 30 pg (ref 26.0–34.0)
MCHC: 30.3 g/dL (ref 30.0–36.0)
MCV: 99.1 fL (ref 80.0–100.0)
Monocytes Absolute: 0.2 10*3/uL (ref 0.1–1.0)
Monocytes Relative: 5 %
Neutro Abs: 2.9 10*3/uL (ref 1.7–7.7)
Neutrophils Relative %: 81 %
Platelets: 97 10*3/uL — ABNORMAL LOW (ref 150–400)
RBC: 4.36 MIL/uL (ref 3.87–5.11)
RDW: 18.3 % — ABNORMAL HIGH (ref 11.5–15.5)
WBC: 3.5 10*3/uL — ABNORMAL LOW (ref 4.0–10.5)
nRBC: 0 % (ref 0.0–0.2)

## 2020-10-11 LAB — PROTIME-INR
INR: 1.3 — ABNORMAL HIGH (ref 0.8–1.2)
Prothrombin Time: 16.1 seconds — ABNORMAL HIGH (ref 11.4–15.2)

## 2020-10-11 LAB — LACTIC ACID, PLASMA
Lactic Acid, Venous: 3 mmol/L (ref 0.5–1.9)
Lactic Acid, Venous: 1.9 mmol/L (ref 0.5–1.9)

## 2020-10-11 LAB — C-REACTIVE PROTEIN: CRP: 1.8 mg/dL — ABNORMAL HIGH (ref <1.0)

## 2020-10-11 LAB — TSH: TSH: 17.36 u[IU]/mL — ABNORMAL HIGH (ref 0.350–4.500)

## 2020-10-11 LAB — CBG MONITORING, ED: Glucose-Capillary: 187 mg/dL — ABNORMAL HIGH (ref 70–99)

## 2020-10-11 LAB — SARS CORONAVIRUS 2 BY RT PCR (HOSPITAL ORDER, PERFORMED IN ~~LOC~~ HOSPITAL LAB): SARS Coronavirus 2: POSITIVE — AB

## 2020-10-11 LAB — PROCALCITONIN: Procalcitonin: 0.18 ng/mL

## 2020-10-11 LAB — LACTATE DEHYDROGENASE: LDH: 257 U/L — ABNORMAL HIGH (ref 98–192)

## 2020-10-11 LAB — FERRITIN: Ferritin: 135 ng/mL (ref 11–307)

## 2020-10-11 LAB — D-DIMER, QUANTITATIVE: D-Dimer, Quant: 1.62 ug/mL-FEU — ABNORMAL HIGH (ref 0.00–0.50)

## 2020-10-11 LAB — APTT: aPTT: 32 seconds (ref 24–36)

## 2020-10-11 MED ORDER — VITAMIN D (ERGOCALCIFEROL) 1.25 MG (50000 UNIT) PO CAPS: 50000.0000 [IU] | ORAL_CAPSULE | ORAL | Status: DC

## 2020-10-11 MED ORDER — LACTATED RINGERS IV BOLUS (SEPSIS): 1000.0000 mL | Freq: Once | INTRAVENOUS | Status: DC

## 2020-10-11 MED ORDER — HYDROCOD POLST-CPM POLST ER 10-8 MG/5ML PO SUER
5.0000 mL | Freq: Two times a day (BID) | ORAL | Status: DC | PRN
  Filled 2020-10-11: qty 5

## 2020-10-11 MED ORDER — METHYLPREDNISOLONE SODIUM SUCC 40 MG IJ SOLR
0.5000 mg/kg | Freq: Two times a day (BID) | INTRAMUSCULAR | Status: AC
  Administered 2020-10-11 – 2020-10-14 (×6): 32 mg via INTRAVENOUS
  Filled 2020-10-11 (×6): qty 1

## 2020-10-11 MED ORDER — SODIUM CHLORIDE 0.9 % IV SOLN
100.0000 mg | Freq: Every day | INTRAVENOUS | Status: AC
  Administered 2020-10-12 – 2020-10-15 (×4): 100 mg via INTRAVENOUS
  Filled 2020-10-11 (×2): qty 20
  Filled 2020-10-11: qty 100
  Filled 2020-10-11: qty 20

## 2020-10-11 MED ORDER — SERTRALINE HCL 50 MG PO TABS
50.0000 mg | ORAL_TABLET | Freq: Every day | ORAL | Status: DC
Start: 2020-10-12 — End: 2020-10-17
  Administered 2020-10-12 – 2020-10-17 (×6): 50 mg via ORAL
  Filled 2020-10-11 (×6): qty 1

## 2020-10-11 MED ORDER — SODIUM CHLORIDE 0.9% FLUSH
3.0000 mL | Freq: Two times a day (BID) | INTRAVENOUS | Status: DC
  Administered 2020-10-11 – 2020-10-17 (×12): 3 mL via INTRAVENOUS

## 2020-10-11 MED ORDER — ASCORBIC ACID 500 MG PO TABS
500.0000 mg | ORAL_TABLET | Freq: Every day | ORAL | Status: DC
  Administered 2020-10-11 – 2020-10-17 (×7): 500 mg via ORAL
  Filled 2020-10-11 (×7): qty 1

## 2020-10-11 MED ORDER — ACETAMINOPHEN 325 MG PO TABS: 650.0000 mg | ORAL_TABLET | Freq: Four times a day (QID) | ORAL | Status: DC | PRN

## 2020-10-11 MED ORDER — MIDODRINE HCL 5 MG PO TABS
5.0000 mg | ORAL_TABLET | Freq: Three times a day (TID) | ORAL | Status: DC
  Administered 2020-10-12 – 2020-10-17 (×15): 5 mg via ORAL
  Filled 2020-10-11 (×16): qty 1

## 2020-10-11 MED ORDER — LACTATED RINGERS IV SOLN: INTRAVENOUS | Status: DC

## 2020-10-11 MED ORDER — SODIUM CHLORIDE 0.9 % IV SOLN
200.0000 mg | Freq: Once | INTRAVENOUS | Status: AC
  Administered 2020-10-11: 200 mg via INTRAVENOUS
  Filled 2020-10-11: qty 200

## 2020-10-11 MED ORDER — ATORVASTATIN CALCIUM 80 MG PO TABS
80.0000 mg | ORAL_TABLET | Freq: Every day | ORAL | Status: DC
  Administered 2020-10-11 – 2020-10-16 (×6): 80 mg via ORAL
  Filled 2020-10-11 (×7): qty 1

## 2020-10-11 MED ORDER — LACTATED RINGERS IV BOLUS (SEPSIS)
1000.0000 mL | Freq: Once | INTRAVENOUS | Status: AC
  Administered 2020-10-11: 1000 mL via INTRAVENOUS

## 2020-10-11 MED ORDER — ONDANSETRON HCL 4 MG/2ML IJ SOLN
4.0000 mg | Freq: Four times a day (QID) | INTRAMUSCULAR | Status: DC | PRN
  Administered 2020-10-13: 4 mg via INTRAVENOUS
  Filled 2020-10-11 (×2): qty 2

## 2020-10-11 MED ORDER — VANCOMYCIN VARIABLE DOSE PER UNSTABLE RENAL FUNCTION (PHARMACIST DOSING): Status: DC

## 2020-10-11 MED ORDER — PANTOPRAZOLE SODIUM 20 MG PO TBEC
20.0000 mg | DELAYED_RELEASE_TABLET | Freq: Every day | ORAL | Status: DC
  Administered 2020-10-12 – 2020-10-17 (×6): 20 mg via ORAL
  Filled 2020-10-11 (×6): qty 1

## 2020-10-11 MED ORDER — ALBUTEROL SULFATE HFA 108 (90 BASE) MCG/ACT IN AERS
1.0000 | INHALATION_SPRAY | Freq: Four times a day (QID) | RESPIRATORY_TRACT | Status: DC
  Administered 2020-10-11 – 2020-10-17 (×17): 1 via RESPIRATORY_TRACT
  Filled 2020-10-11 (×2): qty 6.7

## 2020-10-11 MED ORDER — FUROSEMIDE 10 MG/ML IJ SOLN
40.0000 mg | Freq: Two times a day (BID) | INTRAMUSCULAR | Status: DC
  Administered 2020-10-11 – 2020-10-17 (×12): 40 mg via INTRAVENOUS
  Filled 2020-10-11 (×12): qty 4

## 2020-10-11 MED ORDER — LEVOTHYROXINE SODIUM 100 MCG PO TABS
100.0000 ug | ORAL_TABLET | Freq: Every day | ORAL | Status: DC
  Administered 2020-10-12 – 2020-10-17 (×6): 100 ug via ORAL
  Filled 2020-10-11 (×6): qty 1

## 2020-10-11 MED ORDER — GUAIFENESIN-DM 100-10 MG/5ML PO SYRP: 10.0000 mL | ORAL_SOLUTION | ORAL | Status: DC | PRN

## 2020-10-11 MED ORDER — SODIUM CHLORIDE 0.9 % IV SOLN
2.0000 g | INTRAVENOUS | Status: DC
  Administered 2020-10-12 – 2020-10-15 (×4): 2 g via INTRAVENOUS
  Filled 2020-10-11 (×4): qty 2

## 2020-10-11 MED ORDER — SENNOSIDES-DOCUSATE SODIUM 8.6-50 MG PO TABS: 1.0000 | ORAL_TABLET | Freq: Every evening | ORAL | Status: DC | PRN

## 2020-10-11 MED ORDER — DEXAMETHASONE SODIUM PHOSPHATE 10 MG/ML IJ SOLN
10.0000 mg | Freq: Once | INTRAMUSCULAR | Status: AC
  Administered 2020-10-11: 10 mg via INTRAVENOUS
  Filled 2020-10-11: qty 1

## 2020-10-11 MED ORDER — ZINC SULFATE 220 (50 ZN) MG PO CAPS
220.0000 mg | ORAL_CAPSULE | Freq: Every day | ORAL | Status: DC
  Administered 2020-10-11 – 2020-10-17 (×7): 220 mg via ORAL
  Filled 2020-10-11 (×7): qty 1

## 2020-10-11 MED ORDER — ENOXAPARIN SODIUM 30 MG/0.3ML ~~LOC~~ SOLN
30.0000 mg | SUBCUTANEOUS | Status: DC
  Administered 2020-10-11 – 2020-10-15 (×5): 30 mg via SUBCUTANEOUS
  Filled 2020-10-11 (×5): qty 0.3

## 2020-10-11 MED ORDER — SODIUM CHLORIDE 0.9% FLUSH: 3.0000 mL | INTRAVENOUS | Status: DC | PRN

## 2020-10-11 MED ORDER — VANCOMYCIN HCL 1250 MG/250ML IV SOLN
1250.0000 mg | Freq: Once | INTRAVENOUS | Status: DC
  Filled 2020-10-11: qty 250

## 2020-10-11 MED ORDER — GUAIFENESIN ER 600 MG PO TB12
600.0000 mg | ORAL_TABLET | Freq: Two times a day (BID) | ORAL | Status: DC
  Administered 2020-10-11 – 2020-10-17 (×12): 600 mg via ORAL
  Filled 2020-10-11 (×12): qty 1

## 2020-10-11 MED ORDER — ACETAMINOPHEN 650 MG RE SUPP: 650.0000 mg | Freq: Four times a day (QID) | RECTAL | Status: DC | PRN

## 2020-10-11 MED ORDER — VANCOMYCIN HCL IN DEXTROSE 1-5 GM/200ML-% IV SOLN
1000.0000 mg | Freq: Once | INTRAVENOUS | Status: DC
  Filled 2020-10-11: qty 200

## 2020-10-11 MED ORDER — PREDNISONE 50 MG PO TABS
50.0000 mg | ORAL_TABLET | Freq: Every day | ORAL | Status: DC
  Administered 2020-10-14 – 2020-10-17 (×4): 50 mg via ORAL
  Filled 2020-10-11 (×5): qty 1

## 2020-10-11 MED ORDER — SODIUM CHLORIDE 0.9 % IV SOLN: 250.0000 mL | INTRAVENOUS | Status: DC | PRN

## 2020-10-11 MED ORDER — AMIODARONE HCL 200 MG PO TABS
200.0000 mg | ORAL_TABLET | Freq: Every day | ORAL | Status: DC
  Administered 2020-10-11 – 2020-10-17 (×7): 200 mg via ORAL
  Filled 2020-10-11 (×7): qty 1

## 2020-10-11 MED ORDER — SODIUM CHLORIDE 0.9 % IV SOLN
2.0000 g | Freq: Once | INTRAVENOUS | Status: AC
  Administered 2020-10-11: 2 g via INTRAVENOUS
  Filled 2020-10-11: qty 2

## 2020-10-11 MED ORDER — FUROSEMIDE 10 MG/ML IJ SOLN
40.0000 mg | Freq: Once | INTRAMUSCULAR | Status: AC
  Administered 2020-10-11: 40 mg via INTRAVENOUS
  Filled 2020-10-11: qty 4

## 2020-10-11 MED ORDER — RANOLAZINE ER 500 MG PO TB12
500.0000 mg | ORAL_TABLET | Freq: Every day | ORAL | Status: DC
  Administered 2020-10-12 – 2020-10-13 (×2): 500 mg via ORAL
  Filled 2020-10-11 (×2): qty 1

## 2020-10-11 MED ORDER — METRONIDAZOLE IN NACL 5-0.79 MG/ML-% IV SOLN
500.0000 mg | Freq: Once | INTRAVENOUS | Status: AC
  Administered 2020-10-11: 500 mg via INTRAVENOUS
  Filled 2020-10-11: qty 100

## 2020-10-11 MED ORDER — FENTANYL CITRATE (PF) 100 MCG/2ML IJ SOLN: 12.5000 ug | INTRAMUSCULAR | Status: DC | PRN

## 2020-10-11 MED ORDER — ONDANSETRON HCL 4 MG PO TABS: 4.0000 mg | ORAL_TABLET | Freq: Four times a day (QID) | ORAL | Status: DC | PRN

## 2020-10-11 NOTE — ED Provider Notes (Signed)
[facility] EMERGENCY DEPARTMENT Provider Note   CSN: [id number] Arrival date & time: 10/11/20  1359     History Chief Complaint  Patient presents with  . Nausea  . Emesis  . Fatigue    Jenna West is a 75 y.o. female.  75 year old female presents after being found unresponsive at home with increased respirations.  Has been found patient in and called EMS.  When fire department arrived, patient had a pulse oximetry of 83% on room air.  She was bagged and sats did improve.  After assisted ventilations, patient came unresponsive.  She notes that she has been increasingly weak and dyspneic for the last 5 days.  Denies any fever or chills.  States that she has had her Covid vaccination including a booster shot.  No vomiting or diarrhea.  Has had some nausea but no abdominal discomfort.  Patient initially hypotensive with EMS with blood pressure of 74/58.  Given 600 cc of saline and blood pressure improved to 102/74.  Placed on 5 L of oxygen and transported here.        Past Medical History:  Diagnosis Date  . Acquired hypothyroidism 05/01/2015  . AICD (automatic cardioverter/defibrillator) present   . Angina pectoris ([place]) 07/13/2015  . Aspirin long-term use 03/07/2016  . Benign essential hypertension 05/01/2015  . Bilateral carotid artery stenosis 03/07/2016  . 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 Carotid artery stenosis 06/21/2015  . Chronic systolic heart failure ([place]) 05/28/2017  . Coronary arteriosclerosis in native artery 05/01/2015   Overview:  Overview:  PCI and stent to LAD and RCA 2004 for MI DES to LAD for subtotal occlusion with no reflow 8/09  . Coronary artery disease involving native coronary artery of native heart with angina pectoris ([place]) 05/01/2015   Overview:  Overview:  PCI and stent to LAD and RCA 2004 for MI DES to LAD for subtotal occlusion with no reflow 8/09  . Coronary artery disease of native  artery of native heart with stable angina pectoris ([place]) 05/01/2015   Overview:  PCI and stent to LAD and RCA 2004 for MI DES to LAD for subtotal occlusion with no reflow 2009  . Heartburn 05/29/2017  . 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 Hypertensive heart and kidney disease with HF and CKD ([place]) 05/28/2017  . ICD (implantable cardioverter-defibrillator) in place 05/01/2015   Overview:  [employer]  . Ischemic cardiomyopathy 06/04/2017  . Left heart failure ([place]) 05/01/2015  . MI (myocardial infarction) ([place]) 05/01/2015  . On amiodarone therapy 08/15/2016  . Pure hypercholesterolemia 05/01/2015  . Rectal bleed 09/2020  . Stage 3 chronic kidney disease ([place]) 12/18/2016  . Syncope and collapse 05/01/2015  . Type 2 diabetes mellitus with stage 3 chronic kidney disease, with long-term current use of insulin ([place]) 07/17/2016  . VT (ventricular tachycardia) ([place]) 05/01/2015    Patient Active Problem List   Diagnosis Date Noted  . Diarrhea, unspecified 06/18/2020  . Seizure ([place]) 06/18/2020  . Ischemic cardiomyopathy 06/04/2017  . Heartburn 05/29/2017  . Acute on chronic systolic CHF (congestive heart failure) ([place]) 05/28/2017  . Hypertensive heart and kidney disease with HF and CKD ([place]) 05/28/2017  . Stage 3 chronic kidney disease ([place]) 12/18/2016  . On amiodarone therapy 08/15/2016  . Type 2 diabetes mellitus with stage 3 chronic kidney disease, with long-term current use of insulin (  [place]) 07/17/2016  . Aspirin long-term use 03/07/2016  . Bilateral carotid artery stenosis 03/07/2016  . Angina pectoris ([place]) 07/13/2015  . Carotid artery stenosis 06/21/2015  . Acquired hypothyroidism 05/01/2015  . 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 Cardiomyopathy ([place]) 05/01/2015  . Coronary artery disease  involving native coronary artery of native heart with angina pectoris ([place]) 05/01/2015  . Coronary artery disease of native artery of native heart with stable angina pectoris ([place]) 05/01/2015  . ICD (implantable cardioverter-defibrillator) in place 05/01/2015  . Left heart failure ([place]) 05/01/2015  . MI (myocardial infarction) ([place]) 05/01/2015  . Pure hypercholesterolemia 05/01/2015  . Syncope and collapse 05/01/2015  . VT (ventricular tachycardia) ([place]) 05/01/2015  . Coronary arteriosclerosis in native artery 05/01/2015    Past Surgical History:  Procedure Laterality Date  . ABDOMINAL HYSTERECTOMY    . ANKLE FRACTURE SURGERY Bilateral    6 months apart, screws  . CARDIAC CATHETERIZATION     2003, 2009, 2016  . CAROTID STENT  06/21/2015   thoracic ao, 4 vessel cerebral arteriogram & rt internal carotid artery pta/stent CPT 58832  . CATARACT EXTRACTION Right    With lens replaced  . COLONOSCOPY  2013  . CORONARY STENT PLACEMENT    . PACEMAKER PLACEMENT     has two, one working and one not, could not remove old one, Dr. Elonda Husky      OB History   No obstetric history on file.     Family History  Problem Relation Age of Onset  . Heart attack Father   . Heart disease Father   . Brain cancer Mother   . Diabetes Sister   . Breast cancer Sister   . Rectal cancer Daughter     Social History   Tobacco Use  . Smoking status: Never Smoker  . Smokeless tobacco: Never Used  Vaping Use  . Vaping Use: Never used  Substance Use Topics  . Alcohol use: No  . Drug use: No    Home Medications Prior to Admission medications   Medication Sig Start Date End Date Taking? Authorizing Provider  amiodarone (PACERONE) 200 MG tablet Take 1 tablet (200 mg total) by mouth daily. 08/15/20   Camnitz, Ocie Doyne, MD  aspirin EC 81 MG tablet Take 81 mg by mouth daily.    [provider]  atorvastatin (LIPITOR) 80 MG tablet TAKE 1 TABLET BY MOUTH  DAILY Patient taking differently:  Take 80 mg by mouth at bedtime. 05/29/20   Richardo Priest, MD  Blood Glucose Monitoring Suppl (ACCU-CHEK AVIVA PLUS) w/Device KIT Apply 1 Dose topically daily. 04/07/19   [provider]  carvedilol (COREG) 6.25 MG tablet TAKE 1 TABLET BY MOUTH  TWICE DAILY Patient taking differently: Take 6.25 mg by mouth 2 (two) times daily with a meal. 05/24/20   Camnitz, Ocie Doyne, MD  clopidogrel (PLAVIX) 75 MG tablet TAKE 1 TABLET BY MOUTH  DAILY Patient taking differently: Take 75 mg by mouth daily. 05/29/20   Richardo Priest, MD  furosemide (LASIX) 20 MG tablet TAKE 1 TABLET BY MOUTH PRN lower ext edema or SOB 10/03/20   Eulogio Bear U, DO  insulin glargine (LANTUS) 100 UNIT/ML injection Inject 10 Units into the skin daily.    [provider]  levothyroxine (SYNTHROID, LEVOTHROID) 100 MCG tablet Take 100 mcg by mouth daily before breakfast.  07/02/17   [provider]  Multiple Vitamins-Minerals ([place]  FOR HER) TABS Take 1 tablet by mouth daily.    [provider]  nitroGLYCERIN (NITROLINGUAL) 0.4 MG/SPRAY spray Place 1 spray under the tongue every 5 (five) minutes x 3 doses as needed for chest pain. 04/24/20   Richardo Priest, MD  pantoprazole (PROTONIX) 20 MG tablet Take 20 mg by mouth daily. 08/23/20   [provider]  ranolazine (RANEXA) 500 MG 12 hr tablet Take 500 mg by mouth daily.  11/07/15   [provider]  sacubitril-valsartan (ENTRESTO) 24-26 MG Take 1 tablet by mouth 2 (two) times daily. 11/22/19   Camnitz, Ocie Doyne, MD  sertraline (ZOLOFT) 50 MG tablet Take 50 mg by mouth daily. 10/24/18   [provider]  Vitamin D, Ergocalciferol, (DRISDOL) 1.25 MG (50000 UT) CAPS capsule Take 50,000 Units by mouth every Wednesday.     [provider]  Wheat Dextrin (BENEFIBER) POWD Take 1 Scoop by mouth daily. 08/23/20   [provider]  zolpidem (AMBIEN) 10 MG tablet Take 10 mg by mouth at bedtime. 07/31/19   [provider]     Allergies    Shellfish allergy and Sulfa antibiotics  Review of Systems   Review of Systems  All other systems reviewed and are negative.   Physical Exam Updated Vital Signs BP (!) 88/53 (BP Location: Right Arm)   Pulse 70   Resp (!) 22   Ht 1.626 m (_0 )   Wt 64 kg   SpO2 96%   BMI 24.22 kg/m   Physical Exam Vitals and nursing note reviewed.  Constitutional:      General: She is not in acute distress.    Appearance: Normal appearance. She is well-developed and well-nourished. She is not toxic-appearing.  HENT:     Head: Normocephalic and atraumatic.  Eyes:     General: Lids are normal.     Extraocular Movements: EOM normal.     Conjunctiva/sclera: Conjunctivae normal.     Pupils: Pupils are equal, round, and reactive to light.  Neck:     Thyroid: No thyroid mass.     Trachea: No tracheal deviation.  Cardiovascular:     Rate and Rhythm: Normal rate and regular rhythm.     Heart sounds: Normal heart sounds. No murmur heard. No gallop.   Pulmonary:     Effort: Pulmonary effort is normal. No respiratory distress.     Breath sounds: No stridor. Examination of the right-upper field reveals decreased breath sounds. Examination of the left-upper field reveals decreased breath sounds. Decreased breath sounds present. No wheezing, rhonchi or rales.  Abdominal:     General: Bowel sounds are normal. There is no distension.     Palpations: Abdomen is soft.     Tenderness: There is no abdominal tenderness. There is no CVA tenderness or rebound.  Musculoskeletal:        General: No tenderness or edema. Normal range of motion.     Cervical back: Normal range of motion and neck supple.  Skin:    General: Skin is warm and dry.     Findings: No abrasion or rash.  Neurological:     Mental Status: She is oriented to person, place, and time. She is lethargic.     GCS: GCS eye subscore is 4. GCS verbal subscore is 5. GCS motor subscore is 6.     Cranial Nerves: No cranial  nerve deficit.     Sensory: No sensory deficit.     Deep Tendon Reflexes: Strength normal.  Comments: Strength is 5 of 5 in upper as well as lower extremities  Psychiatric:        Attention and Perception: She is inattentive.        Mood and Affect: Affect is blunt.        Speech: Speech normal.        Behavior: Behavior is slowed. Behavior is cooperative.     ED Results / Procedures / Treatments   Labs (all labs ordered are listed, but only abnormal results are displayed) Labs Reviewed  SARS CORONAVIRUS 2 BY RT PCR ([facility])  CULTURE, BLOOD (ROUTINE X 2)  CULTURE, BLOOD (ROUTINE X 2)  URINE CULTURE  LACTIC ACID, PLASMA  LACTIC ACID, PLASMA  COMPREHENSIVE METABOLIC PANEL  CBC WITH DIFFERENTIAL/PLATELET  PROTIME-INR  APTT  URINALYSIS, ROUTINE W REFLEX MICROSCOPIC  TSH    EKG None  Radiology No results found.  Procedures Procedures   Medications Ordered in ED Medications  lactated ringers infusion (has no administration in time range)  lactated ringers bolus 1,000 mL (has no administration in time range)    And  lactated ringers bolus 1,000 mL (has no administration in time range)  ceFEPIme (MAXIPIME) 2 g in sodium chloride 0.9 % 100 mL IVPB (has no administration in time range)  metroNIDAZOLE (FLAGYL) IVPB 500 mg (has no administration in time range)  vancomycin (VANCOCIN) IVPB 1000 mg/200 mL premix (has no administration in time range)    ED Course  I have reviewed the triage vital signs and the nursing notes.  Pertinent labs & imaging results that were available during my care of the patient were reviewed by me and considered in my medical decision making (see chart for details).    MDM Rules/Calculators/A&P                          Patient's x-ray consistent with CHF.  IV fluids discontinued.  Patient received IV dose of diuretics.  Work-up is pending at this time and care turned over to Dr. Ron Parker Final  Clinical Impression(s) / ED Diagnoses Final diagnoses:  None    Rx / DC Orders ED Discharge Orders    None       Lacretia Leigh, MD 10/11/20 1505

## 2020-10-11 NOTE — ED Provider Notes (Signed)
Medical Decision Making: Care of patient assumed from Dr. Zenia Resides at 1500.  Agree with history, physical exam and plan.  See their note for further details.  Briefly, The pt p/w significant shortness of breath and hypoxia.  Hypotension hypothermia concerning for sepsis, initial fluid bolus was initiated but then deemed unnecessary as the patient was markedly volume overloaded on chest x-ray.  Home diuretic doses was then given.  Broad-spectrum antibiotics were given cultures were obtained.  Concern for Covid and testing was done as well..   Current plan is as follows: This patient will need admission for acute hypoxic respiratory failure in the setting of volume overload and possible infection.  Pt apears to have worsening renal function likely secondary to volume overload in the setting of heart failure exacerbation.  Also is Covid positive.  Will get remdesivir.  Fluids were stopped diuretics were given.  Blood pressure remained stable lactic acidosis, we will recheck.  Patient will need admission.  Family is updated.  The patient will be admitted to the hospitalist.  For the remainder this patient's care please see inpatient team notes.  I will intervene as needed while the patient remains in the emergency department.   .Critical Care Performed by: Breck Coons, MD Authorized by: Breck Coons, MD   Critical care provider statement:    Critical care time (minutes):  45   Critical care was necessary to treat or prevent imminent or life-threatening deterioration of the following conditions:  Renal failure, cardiac failure, circulatory failure, respiratory failure and sepsis   Critical care was time spent personally by me on the following activities:  Discussions with consultants, evaluation of patient's response to treatment, examination of patient, ordering and performing treatments and interventions, ordering and review of laboratory studies, ordering and review of radiographic studies, pulse  oximetry, re-evaluation of patient's condition, obtaining history from patient or surrogate and review of old charts     CRITICAL CARE Performed by: Breck Coons   Total critical care time: 45 minutes  Critical care time was exclusive of separately billable procedures and treating other patients.  Critical care was necessary to treat or prevent imminent or life-threatening deterioration.  Critical care was time spent personally by me on the following activities: development of treatment plan with patient and/or surrogate as well as nursing, discussions with consultants, evaluation of patient's response to treatment, examination of patient, obtaining history from patient or surrogate, ordering and performing treatments and interventions, ordering and review of laboratory studies, ordering and review of radiographic studies, pulse oximetry and re-evaluation of patient's condition.   I personally reviewed and interpreted all labs/imaging.      Breck Coons, MD 10/11/20 [phone]

## 2020-10-11 NOTE — Progress Notes (Signed)
Pharmacy Antibiotic Note  Jenna West is a 75 y.o. female admitted on 10/11/2020 with sepsis.  Pharmacy has been consulted for vancomycin and cefepime dosing.  Scr 2.65, trending up, recent baseline unclear. Current estimated CrCl ~16 ml/min. WBC 3.5. Lactic acid pending.  Plan: Cefepime 2g x1 then 2g q24h Vancomycin 1250 mg x1.  - Poor renal function, pharmacy will order intermittent vancomycin doses as necessary Monitor renal function, cultures, labs, clinical progression  Height: 5\' 4"  (162.6 cm) Weight: 64 kg (141 lb 1.5 oz) IBW/kg (Calculated) : 54.7  Temp (24hrs), Avg:96 F (35.6 C), Min:96 F (35.6 C), Max:96 F (35.6 C)  No results for input(s): WBC, CREATININE, LATICACIDVEN, VANCOTROUGH, VANCOPEAK, VANCORANDOM, GENTTROUGH, GENTPEAK, GENTRANDOM, TOBRATROUGH, TOBRAPEAK, TOBRARND, AMIKACINPEAK, AMIKACINTROU, AMIKACIN in the last 168 hours.  Estimated Creatinine Clearance: 20 mL/min (A) (by C-G formula based on SCr of 2.13 mg/dL (H)).    Allergies  Allergen Reactions  . Shellfish Allergy Swelling    Of mouth only  . Sulfa Antibiotics Other (See Comments)    Internal bleeding    Antimicrobials this admission: Cefepime 1/26 >>  Vancomycin 1/26 >>   Dose adjustments this admission:   Microbiology results: 1/26 BCx: sent 1/26 UCx: sent  1/26 PCR: covid positive  Thank you for allowing pharmacy to be a part of this patient's care.  Fara Olden, PharmD PGY-1 Pharmacy Resident 10/11/2020 4:40 PM Please see AMION for all pharmacy numbers

## 2020-10-11 NOTE — H&P (Signed)
History and Physical  [name] ERD:[id number] DOB: Dec 08, 1945 DOA: 10/11/2020  PCP: Ronita Hipps, MD Patient coming from: Home   I have personally briefly reviewed patient's old medical records in [place]   Chief Complaint: SOB  HPI: Jenna West is a 75 y.o. female hypothyroidism, chronic systolic heart failure Ef 20 %, CAD, hypertension, seizure, CKD stage IIIb, diabetes, IBS recent admission 1/18 for acute on chronic systolic heart failure exacerbation and rectal bleeding.  During last admission palliative care was recommended for patient due to her end-stage heart failure,  CKD and dementia. She was thought not to be a candidate for endoscopy colonoscopy during last admission.  Patient presented via EMS, patient was found in bed unresponsive, not breathing normally per  husband.  EMS was called.  On EMS arrival oxygen saturation was 83 on room air.  Patient received ventilation and subsequently she became more alert.  Was placed on nonrebreather mask.  And subsequently transitioned to 5 L in the ED.  Evaluation in the ED; patient was found to be hypotensive systolic blood pressure 56/31.  She received IV bolus which improved her blood pressure 100/74.  She was placed on 5 L of oxygen. Covid PCR test came back positive.  Chest x-ray; increase in perihilar airspace disease suggests flash pulmonary edema, right pleural effusion.  Sodium 134, creatinine 2.6, BNP 2652, lactic acid 3.0, white blood cell 3.5, hemoglobin 13, platelet 97, INR 1.3..  On my evaluation patient patient was alert, report breathing better. Denies chest pain or significant cough. She relates her sister is her POA. She was ok with me talking to her sister. Patient agrees with not intubation or be place on life support. She agrees with treatment for covid.   Review of Systems: All systems reviewed and apart from history of presenting illness, are negative.  Past Medical History:  Diagnosis Date  .  Acquired hypothyroidism 05/01/2015  . AICD (automatic cardioverter/defibrillator) present   . Angina pectoris ([place]) 07/13/2015  . Aspirin long-term use 03/07/2016  . Benign essential hypertension 05/01/2015  . Bilateral carotid artery stenosis 03/07/2016  . 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 Carotid artery stenosis 06/21/2015  . Chronic systolic heart failure ([place]) 05/28/2017  . Coronary arteriosclerosis in native artery 05/01/2015   Overview:  Overview:  PCI and stent to LAD and RCA 2004 for MI DES to LAD for subtotal occlusion with no reflow 8/09  . Coronary artery disease involving native coronary artery of native heart with angina pectoris ([place]) 05/01/2015   Overview:  Overview:  PCI and stent to LAD and RCA 2004 for MI DES to LAD for subtotal occlusion with no reflow 8/09  . Coronary artery disease of native artery of native heart with stable angina pectoris ([place]) 05/01/2015   Overview:  PCI and stent to LAD and RCA 2004 for MI DES to LAD for subtotal occlusion with no reflow 2009  . Heartburn 05/29/2017  . 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 Hypertensive heart and kidney disease with HF and CKD ([place]) 05/28/2017  . ICD (implantable cardioverter-defibrillator) in place 05/01/2015   Overview:  [employer]  . Ischemic cardiomyopathy 06/04/2017  . Left heart failure ([place]) 05/01/2015  . MI (myocardial infarction) ([place]) 05/01/2015  . On amiodarone therapy 08/15/2016  . Pure hypercholesterolemia 05/01/2015  . Rectal bleed 09/2020  .  Stage 3 chronic kidney disease ([place]) 12/18/2016  . Syncope and collapse 05/01/2015  . Type 2 diabetes mellitus with stage 3 chronic kidney disease, with long-term current use of insulin ([place]) 07/17/2016  . VT (ventricular tachycardia) ([place]) 05/01/2015   Past Surgical History:  Procedure Laterality Date  . ABDOMINAL HYSTERECTOMY    .  ANKLE FRACTURE SURGERY Bilateral    6 months apart, screws  . CARDIAC CATHETERIZATION     2003, 2009, 2016  . CAROTID STENT  06/21/2015   thoracic ao, 4 vessel cerebral arteriogram & rt internal carotid artery pta/stent CPT 35009  . CATARACT EXTRACTION Right    With lens replaced  . COLONOSCOPY  2013  . CORONARY STENT PLACEMENT    . PACEMAKER PLACEMENT     has two, one working and one not, could not remove old one, Dr. Elonda Husky    Social History:  reports that she has never smoked. She has never used smokeless tobacco. She reports that she does not drink alcohol and does not use drugs.   Allergies  Allergen Reactions  . Shellfish Allergy Swelling    Of mouth only  . Sulfa Antibiotics Other (See Comments)    Internal bleeding    Family History  Problem Relation Age of Onset  . Heart attack Father   . Heart disease Father   . Brain cancer Mother   . Diabetes Sister   . Breast cancer Sister   . Rectal cancer Daughter     Prior to Admission medications   Medication Sig Start Date End Date Taking? Authorizing Provider  amiodarone (PACERONE) 200 MG tablet Take 1 tablet (200 mg total) by mouth daily. 08/15/20   Camnitz, Ocie Doyne, MD  aspirin EC 81 MG tablet Take 81 mg by mouth daily.    [provider]  atorvastatin (LIPITOR) 80 MG tablet TAKE 1 TABLET BY MOUTH  DAILY Patient taking differently: Take 80 mg by mouth at bedtime. 05/29/20   Richardo Priest, MD  Blood Glucose Monitoring Suppl (ACCU-CHEK AVIVA PLUS) w/Device KIT Apply 1 Dose topically daily. 04/07/19   [provider]  carvedilol (COREG) 6.25 MG tablet TAKE 1 TABLET BY MOUTH  TWICE DAILY Patient taking differently: Take 6.25 mg by mouth 2 (two) times daily with a meal. 05/24/20   Camnitz, Ocie Doyne, MD  clopidogrel (PLAVIX) 75 MG tablet TAKE 1 TABLET BY MOUTH  DAILY Patient taking differently: Take 75 mg by mouth daily. 05/29/20   Richardo Priest, MD  furosemide (LASIX) 20 MG tablet TAKE 1 TABLET BY  MOUTH PRN lower ext edema or SOB 10/03/20   Eulogio Bear U, DO  insulin glargine (LANTUS) 100 UNIT/ML injection Inject 10 Units into the skin daily.    [provider]  levothyroxine (SYNTHROID, LEVOTHROID) 100 MCG tablet Take 100 mcg by mouth daily before breakfast.  07/02/17   [provider]  Multiple Vitamins-Minerals (MULTI FOR HER) TABS Take 1 tablet by mouth daily.    [provider]  nitroGLYCERIN (NITROLINGUAL) 0.4 MG/SPRAY spray Place 1 spray under the tongue every 5 (five) minutes x 3 doses as needed for chest pain. 04/24/20   Richardo Priest, MD  pantoprazole (PROTONIX) 20 MG tablet Take 20 mg by mouth daily. 08/23/20   [provider]  ranolazine (RANEXA) 500 MG 12 hr tablet Take 500 mg by mouth daily.  11/07/15   [provider]  sacubitril-valsartan (ENTRESTO) 24-26 MG Take 1 tablet by mouth 2 (two) times daily. 11/22/19  Camnitz, Will Hassell Done, MD  sertraline (ZOLOFT) 50 MG tablet Take 50 mg by mouth daily. 10/24/18   [provider]  Vitamin D, Ergocalciferol, (DRISDOL) 1.25 MG (50000 UT) CAPS capsule Take 50,000 Units by mouth every Wednesday.     [provider]  Wheat Dextrin (BENEFIBER) POWD Take 1 Scoop by mouth daily. 08/23/20   [provider]  zolpidem (AMBIEN) 10 MG tablet Take 10 mg by mouth at bedtime. 07/31/19   [provider]   Physical Exam: Vitals:   10/11/20 1645 10/11/20 1700 10/11/20 1715 10/11/20 1730  BP: 113/69 100/68 96/62 94/71  Pulse: 69 70 70 69  Resp: _0 Temp:      TempSrc:      SpO2: 100% 97% 96% 95%  Weight:      Height:         General exam: Alert, mild distress  Head, eyes and ENT: Nontraumatic and normocephalic. Pupils equally reacting to light and accommodation. Oral mucosa moist.  Neck: Supple. positive JVD, carotid bruit or thyromegaly.  Lymphatics: No lymphadenopathy.  Respiratory system: tachypnea, BL crackles.   Cardiovascular system: S1 and S2  heard, RRR. No JVD, murmurs, gallops, clicks or pedal edema.  Gastrointestinal system: Abdomen is nondistended, soft and nontender. Normal bowel sounds heard. No organomegaly or masses appreciated.  Central nervous system: Alert and oriented. No focal neurological deficits.  Extremities: Symmetric 5 x 5 power. Cold extremities  Musculoskeletal system: Negative exam.  Psychiatry: Pleasant and cooperative.   Labs on Admission:  Basic Metabolic Panel: Recent Labs  Lab 10/11/20 1428  NA 134*  K 4.1  CL 97*  CO2 23  GLUCOSE 199*  BUN 32*  CREATININE 2.65*  CALCIUM 7.8*   Liver Function Tests: Recent Labs  Lab 10/11/20 1428  AST 27  ALT 16  ALKPHOS 73  BILITOT 2.1*  PROT 5.4*  ALBUMIN 2.7*   No results for input(s): LIPASE, AMYLASE in the last 168 hours. No results for input(s): AMMONIA in the last 168 hours. CBC: Recent Labs  Lab 10/11/20 1428  WBC 3.5*  NEUTROABS 2.9  HGB 13.1  HCT 43.2  MCV 99.1  PLT 97*   Cardiac Enzymes: No results for input(s): CKTOTAL, CKMB, CKMBINDEX, TROPONINI in the last 168 hours.  BNP (last 3 results) No results for input(s): PROBNP in the last 8760 hours. CBG: Recent Labs  Lab 10/11/20 1426  GLUCAP 187*    Radiological Exams on Admission: DG Chest Port 1 View  Result Date: 10/11/2020 CLINICAL DATA:  Short of breath EXAM: PORTABLE CHEST 1 VIEW COMPARISON:  09/27/2020 FINDINGS: Stable large cardiac silhouette. Interval increase in perihilar diffuse airspace disease. Bilateral pleural effusions. No pneumothorax. Breast prosthetic noted. LEFT chest wall pacemaker. IMPRESSION: Increase in perihilar airspace disease suggest flash pulmonary edema RIGHT pleural effusion. Electronically Signed   By: Suzy Bouchard M.D.   On: 10/11/2020 14:59    EKG: not available to reviewed.   Assessment/Plan Active Problems:   Acquired hypothyroidism   Cardiomyopathy ([place])   ICD (implantable cardioverter-defibrillator) in place   Type 2  diabetes mellitus with stage 3 chronic kidney disease, with long-term current use of insulin (HCC)   Acute on chronic systolic CHF (congestive heart failure) (HCC)   Seizure (H[facility])   Pneumonia due to COVID-19 virus   Acute hypercapnic respiratory failure ([place])   1-Acute hypoxic respiratory Failure; Covid 19 PNA Present with Oxygen sat in the 80. Requiring 5 L oxygen. Chest x ray with pulmonary edema  and pleural effusion.  In setting of Covid 19  PNA and Systolic Heart failure exacerbation.  Started on Remdesivir and IV steroids.  Check procalcitonin, if negative plan to start Baricitinib.  Guaifenesin and Albuterol inhaler.   2-Acute systolic Heart failure exacerbation;  Continue with IV lasix, as BP allows.  Start midodrine to help with BP.  Discussed with sister, no Central line, picc line or IV pressors.  Palliative care consulted.  Hold entresto due to AKI and hypotension. Hold coreg.   3-VT; continue with amiodarone.   4-Hypotension, lactic acidosis;  Suspect related to covid and or cardiogenic shock.  Discussed with sister, no IV pressors.  Start Midodrine.  Continue with Cefepime. Discontinue vancomycin to avoid worsening renal function.  Follow Blood culture.   5-Leukopenia, thrombocytopenia. Related to viral illness. Follow trend.   6-AKI on CKD stage IIIb; Prior Cr 1.8--2.1 Presents with cr at 2.6.  Suspect cardio renal syndrome plus hypotension.  Monitor on IV lasix.     DVT Prophylaxis: lovenox Code Status: Discussed with sister, patient poor prognosis , End stages conditions now with covid. Patient will be DNR/DNI. No IV pressors. Palliative care consult. Patient agrees not to be in life support, no CPR.  Family Communication: Sister over phone. Sister was very Patent attorney of care.  Disposition Plan: admit in patient patient presents with multiorgan failure and Covid 19 PNA>   Time spent: 75 minutes  Elmarie Shiley MD [facility]   10/11/2020,  6:04 PM

## 2020-10-11 NOTE — ED Triage Notes (Signed)
Pt BIB [facility] EMS after husband called 911 after finding pt in the bed unresponsive, not breathing normally. Once fire dept arrived, they started bagging patient to assist with ventilation. EMS noted pt to be 83 % O2 sat on RA. EMS placed pt on 3 L O2 and pt improved to 94 %. After ventilations were assisted pt became more alert AOx4. Pt only complains of feeling ill and tired. Denies pain, SOB. Only endorses nausea with 3 episodes of emesis w/ EMS. Initially pt was hypotensive with EMS at 74/58. 600 of NS given and last BP noted to be 102/74. CBG-251 HR-80-90s. Pt placed on 5 L of O2 upon arrival due to RA O2 noted to be 75%. O2 sat improved to 96 % on 5 L

## 2020-10-11 NOTE — ED Notes (Signed)
Pt placed on hospital bed. Warm blankets applied.

## 2020-10-11 NOTE — ED Notes (Signed)
Notified Kelsey PA of lactic of 3.0

## 2020-10-11 NOTE — Sepsis Progress Note (Signed)
Sepsis monitoring complete 

## 2020-10-11 NOTE — ED Notes (Signed)
Pts sister given an update

## 2020-10-12 DIAGNOSIS — J810 Acute pulmonary edema: Secondary | ICD-10-CM

## 2020-10-12 DIAGNOSIS — Z515 Encounter for palliative care: Secondary | ICD-10-CM

## 2020-10-12 DIAGNOSIS — I5023 Acute on chronic systolic (congestive) heart failure: Secondary | ICD-10-CM | POA: Diagnosis not present

## 2020-10-12 DIAGNOSIS — U071 COVID-19: Principal | ICD-10-CM

## 2020-10-12 DIAGNOSIS — N179 Acute kidney failure, unspecified: Secondary | ICD-10-CM

## 2020-10-12 DIAGNOSIS — J9601 Acute respiratory failure with hypoxia: Secondary | ICD-10-CM | POA: Diagnosis not present

## 2020-10-12 DIAGNOSIS — Z7189 Other specified counseling: Secondary | ICD-10-CM

## 2020-10-12 LAB — CBC WITH DIFFERENTIAL/PLATELET
Abs Immature Granulocytes: 0.01 10*3/uL (ref 0.00–0.07)
Basophils Absolute: 0 10*3/uL (ref 0.0–0.1)
Basophils Relative: 0 %
Eosinophils Absolute: 0 10*3/uL (ref 0.0–0.5)
Eosinophils Relative: 0 %
HCT: 41.3 % (ref 36.0–46.0)
Hemoglobin: 13.3 g/dL (ref 12.0–15.0)
Immature Granulocytes: 1 %
Lymphocytes Relative: 11 %
Lymphs Abs: 0.2 10*3/uL — ABNORMAL LOW (ref 0.7–4.0)
MCH: 30.9 pg (ref 26.0–34.0)
MCHC: 32.2 g/dL (ref 30.0–36.0)
MCV: 95.8 fL (ref 80.0–100.0)
Monocytes Absolute: 0 10*3/uL — ABNORMAL LOW (ref 0.1–1.0)
Monocytes Relative: 2 %
Neutro Abs: 1.6 10*3/uL — ABNORMAL LOW (ref 1.7–7.7)
Neutrophils Relative %: 86 %
Platelets: 79 10*3/uL — ABNORMAL LOW (ref 150–400)
RBC: 4.31 MIL/uL (ref 3.87–5.11)
RDW: 18.2 % — ABNORMAL HIGH (ref 11.5–15.5)
Smear Review: DECREASED
WBC: 1.8 10*3/uL — ABNORMAL LOW (ref 4.0–10.5)
nRBC: 0 % (ref 0.0–0.2)

## 2020-10-12 LAB — MAGNESIUM: Magnesium: 1.7 mg/dL (ref 1.7–2.4)

## 2020-10-12 LAB — COMPREHENSIVE METABOLIC PANEL
ALT: 12 U/L (ref 0–44)
AST: 20 U/L (ref 15–41)
Albumin: 2.6 g/dL — ABNORMAL LOW (ref 3.5–5.0)
Alkaline Phosphatase: 60 U/L (ref 38–126)
Anion gap: 14 (ref 5–15)
BUN: 33 mg/dL — ABNORMAL HIGH (ref 8–23)
CO2: 24 mmol/L (ref 22–32)
Calcium: 7.9 mg/dL — ABNORMAL LOW (ref 8.9–10.3)
Chloride: 98 mmol/L (ref 98–111)
Creatinine, Ser: 2.51 mg/dL — ABNORMAL HIGH (ref 0.44–1.00)
GFR, Estimated: 20 mL/min — ABNORMAL LOW (ref >60)
Glucose, Bld: 174 mg/dL — ABNORMAL HIGH (ref 70–99)
Potassium: 4.2 mmol/L (ref 3.5–5.1)
Sodium: 136 mmol/L (ref 135–145)
Total Bilirubin: 2.2 mg/dL — ABNORMAL HIGH (ref 0.3–1.2)
Total Protein: 5.4 g/dL — ABNORMAL LOW (ref 6.5–8.1)

## 2020-10-12 LAB — PROCALCITONIN: Procalcitonin: 0.21 ng/mL

## 2020-10-12 LAB — C-REACTIVE PROTEIN: CRP: 2 mg/dL — ABNORMAL HIGH (ref <1.0)

## 2020-10-12 LAB — D-DIMER, QUANTITATIVE: D-Dimer, Quant: 1.78 ug/mL-FEU — ABNORMAL HIGH (ref 0.00–0.50)

## 2020-10-12 NOTE — Consult Note (Signed)
Consultation Note Date: 10/12/2020   Patient Name: Jenna West  DOB: 29-Sep-1945  MRN: [id number]  Age / Sex: 75 y.o., female  PCP: Ronita Hipps, MD Referring Physician: Donne Hazel, MD  Reason for Consultation: Establishing goals of care  HPI/Patient Profile: 75 y.o. female  with past medical history of chronic systolic heart failure (EF 20%), CAD, hypertension, CKD stage IIIb, diabetes, seizure, and hypothyroidism. She was recently admitted 1/12 with for acute chronic heart failure exacerbation and rectal bleeding. She was determined not a candidate for endoscopy or colonscopy. She was discharged home on 1/18, and set up with outpatient palliative care through [facility]. Patient presented to the emergency department on 10/11/2020 via EMS after she was found unresponsive in bed, not breathing normally per her husband. On EMS arrival oxygen saturation was 83% on room air. Patient received bag ventilation and subsequently became more alert.  ED Course: Transitioned to 5L oxygen. Found to be hypotensive 74/58. Covid PCR test positive. Labs significant for sodium 134, creatinine 2.6, BNP 2652, lactic acid 3, WBC 3.5, platelet 97, INR 1.3. Chest x-ray shows right pleural effusion and  increase in perihilar airspace disease suggesting flash pulmonary edema.  Patient admitted to [facility] for management of acute hypoxic respiratory failure secondary to Covid pneumonia, acute heart failure exacerbation, AKI on CKD IIIb, hypotension, and lactic acidosis.   Clinical Assessment and Goals of Care: Patient is known to PMT from her recent admission. During [place] discussion on 1/15 patient spoke to "everything being ok at home", was hopeful for improvement, and open to interventions that would prolong her life. Her sister however verbalized concern regarding continued failure to thrive and increasing care needs at home.   Per  H&P, Dr. Tyrell Antonio discussed [place] with patient and sister on 1/26. Plan of care was agreed on as no intubation, central line, or vasopressors.   I have reviewed medical records including EPIC notes, labs and imaging, and met at bedside with patient. She is alert, states she feels "fine". Currently on 3L oxygen with saturation of 96%.    After visiting the patient, I called her sister Jenna West to discuss diagnosis, prognosis, [place], EOL wishes, disposition, and options.  We discussed a brief life review of the patient. She lives in [place] [facility] She retired from work as a Librarian, academic at [employer]. She and her husband have been married for 27 years, it is a second marriage for them both. Her daughter from her first marriage died at age 76 from colon cancer. She then became a mother to her 2nd husband's daughter Claiborne Billings) who is an ER nurse and closely involved.   As far as functional status, Jenna West reports that Damoni was "fairly mobile" prior to her admission 1/12, but has not been very mobile since discharge. She expresses concern about areas of skin breakdown on her backside and ankles from prolonged sitting.    We discussed her current illness and what it means in the larger context of her ongoing co-morbidities.  Natural disease trajectory  of advanced heart failure was discussed. The patient expressed to me that her current medical situation has been difficult to accept.   I attempted to elicit values and goals of care important to the patient. Patient verbalizes she is hopeful to return home. States she would not want to be in a nursing home.   The difference between aggressive medical intervention and comfort care was considered in light of the patient's goals of care. Jenna West is well aware of the severity of patient's medical condition and has decided not to pursue any type of aggressive interventions. Discussed that prognosis is likely 6 months or less. Jenna West initially expresses interest  in home health and PT. Discussed that patients with end-stage illness often do not benefit from PT services. Hospice care services were explained and offered. Jenna West seems open and receptive to this.     Questions and concerns were addressed.  The family was encouraged to call with questions or concerns.    Primary decision maker: Patient, with support from her sister Oswald Hillock [phone].  Jenna West reports she is patient's HCPOA, but has been unable to locate the paperwork. She communicates with the rest of the family and includes them on any major decisions.     SUMMARY OF RECOMMENDATIONS    DNR/DNI as previously documented  Family declines aggressive medical intervention including central line, vasopressors  Continue current medical care  Plan is for watchful waiting  Goal of care is for patient to return home  Family may need to decide between [facility] with PT versus home with hospice.   Would recommend PT consult  PMT will continue to follow  Code Status/Advance Care Planning:  DNR  Symptom Management:   Per primary team  Palliative Prophylaxis:   Delirium Protocol and Turn Reposition  Additional Recommendations (Limitations, Scope, Preferences):  No aggressive interventions  Psycho-social/Spiritual:   Created space and opportunity for patient and family to express thoughts and feelings regarding patient's current medical situation.   Emotional support provided   Prognosis:   < 6 months  Discharge Planning: To Be Determined      Primary Diagnoses: Present on Admission: . Pneumonia due to COVID-19 virus . Acquired hypothyroidism . ICD (implantable cardioverter-defibrillator) in place . Acute on chronic systolic CHF (congestive heart failure) ([place])   I have reviewed the medical record, interviewed the patient and family, and examined the patient. The following aspects are pertinent.  Past Medical History:  Diagnosis Date  . Acquired hypothyroidism  05/01/2015  . AICD (automatic cardioverter/defibrillator) present   . Angina pectoris ([place]) 07/13/2015  . Aspirin long-term use 03/07/2016  . Benign essential hypertension 05/01/2015  . Bilateral carotid artery stenosis 03/07/2016  . 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 Carotid artery stenosis 06/21/2015  . Chronic systolic heart failure ([place]) 05/28/2017  . Coronary arteriosclerosis in native artery 05/01/2015   Overview:  Overview:  PCI and stent to LAD and RCA 2004 for MI DES to LAD for subtotal occlusion with no reflow 8/09  . Coronary artery disease involving native coronary artery of native heart with angina pectoris ([place]) 05/01/2015   Overview:  Overview:  PCI and stent to LAD and RCA 2004 for MI DES to LAD for subtotal occlusion with no reflow 8/09  . Coronary artery disease of native artery of native heart with stable angina pectoris ([place]) 05/01/2015   Overview:  PCI and stent to LAD and RCA 2004 for MI DES to LAD for  subtotal occlusion with no reflow 2009  . Heartburn 05/29/2017  . 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 ([place]) 05/01/2015  . Hypertensive heart and kidney disease with HF and CKD ([place]) 05/28/2017  . ICD (implantable cardioverter-defibrillator) in place 05/01/2015   Overview:  [employer]  . Ischemic cardiomyopathy 06/04/2017  . Left heart failure ([place]) 05/01/2015  . MI (myocardial infarction) ([place]) 05/01/2015  . On amiodarone therapy 08/15/2016  . Pure hypercholesterolemia 05/01/2015  . Rectal bleed 09/2020  . Stage 3 chronic kidney disease ([place]) 12/18/2016  . Syncope and collapse 05/01/2015  . Type 2 diabetes mellitus with stage 3 chronic kidney disease, with long-term current use of insulin ([place]) 07/17/2016  . VT (ventricular tachycardia) ([place]) 05/01/2015    Family History  Problem Relation Age of Onset  . Heart attack Father   . Heart disease Father   .  Brain cancer Mother   . Diabetes Sister   . Breast cancer Sister   . Rectal cancer Daughter    Scheduled Meds: . albuterol  1 puff Inhalation Q6H  . amiodarone  200 mg Oral Daily  . vitamin C  500 mg Oral Daily  . atorvastatin  80 mg Oral QHS  . enoxaparin (LOVENOX) injection  30 mg Subcutaneous Q24H  . furosemide  40 mg Intravenous Q12H  . guaiFENesin  600 mg Oral BID  . levothyroxine  100 mcg Oral QAC breakfast  . methylPREDNISolone (SOLU-MEDROL) injection  0.5 mg/kg Intravenous Q12H   Followed by  . [START ON 10/14/2020] predniSONE  50 mg Oral Daily  . midodrine  5 mg Oral TID WC  . pantoprazole  20 mg Oral Daily  . ranolazine  500 mg Oral Daily  . sertraline  50 mg Oral Daily  . sodium chloride flush  3 mL Intravenous Q12H  . [START ON 10/18/2020] Vitamin D (Ergocalciferol)  50,000 Units Oral Q Wed  . zinc sulfate  220 mg Oral Daily   Continuous Infusions: . sodium chloride    . ceFEPime (MAXIPIME) IV    . remdesivir 100 mg in NS 100 mL Stopped (10/12/20 0955)   PRN Meds:.sodium chloride, acetaminophen **OR** acetaminophen, chlorpheniramine-HYDROcodone, fentaNYL (SUBLIMAZE) injection, guaiFENesin-dextromethorphan, ondansetron **OR** ondansetron (ZOFRAN) IV, senna-docusate, sodium chloride flush Medications Prior to Admission:  Prior to Admission medications   Medication Sig Start Date End Date Taking? Authorizing Provider  amiodarone (PACERONE) 200 MG tablet Take 1 tablet (200 mg total) by mouth daily. 08/15/20  Yes Camnitz, Will Hassell Done, MD  atorvastatin (LIPITOR) 80 MG tablet TAKE 1 TABLET BY MOUTH  DAILY Patient taking differently: Take 80 mg by mouth at bedtime. 05/29/20  Yes Richardo Priest, MD  carvedilol (COREG) 6.25 MG tablet TAKE 1 TABLET BY MOUTH  TWICE DAILY Patient taking differently: Take 6.25 mg by mouth 2 (two) times daily with a meal. 05/24/20  Yes Camnitz, Will Hassell Done, MD  clopidogrel (PLAVIX) 75 MG tablet TAKE 1 TABLET BY MOUTH  DAILY Patient taking differently:  Take 75 mg by mouth daily. 05/29/20  Yes Richardo Priest, MD  furosemide (LASIX) 20 MG tablet TAKE 1 TABLET BY MOUTH PRN lower ext edema or SOB Patient taking differently: Take 20 mg by mouth daily. 10/03/20  Yes Vann, Jessica U, DO  insulin glargine (LANTUS) 100 UNIT/ML injection Inject 10 Units into the skin daily.   Yes [provider]  levothyroxine (SYNTHROID, LEVOTHROID) 100 MCG tablet Take 100 mcg by mouth daily before breakfast.  07/02/17  Yes [provider]  Multiple Vitamins-Minerals (MULTI FOR HER) TABS Take 1 tablet by mouth daily.   Yes [provider]  ondansetron (ZOFRAN) 4 MG tablet Take 4 mg by mouth every 8 (eight) hours as needed for vomiting or nausea. 10/09/20  Yes [provider]  pantoprazole (PROTONIX) 20 MG tablet Take 20 mg by mouth daily. 08/23/20  Yes [provider]  ranolazine (RANEXA) 500 MG 12 hr tablet Take 500 mg by mouth daily.  11/07/15  Yes [provider]  sacubitril-valsartan (ENTRESTO) 24-26 MG Take 1 tablet by mouth 2 (two) times daily. 11/22/19  Yes Camnitz, Will Hassell Done, MD  sertraline (ZOLOFT) 50 MG tablet Take 50 mg by mouth daily. 10/24/18  Yes [provider]  spironolactone (ALDACTONE) 25 MG tablet Take 25 mg by mouth 2 (two) times daily.   Yes [provider]  Vitamin D, Ergocalciferol, (DRISDOL) 1.25 MG (50000 UT) CAPS capsule Take 50,000 Units by mouth every Wednesday.    Yes [provider]  zolpidem (AMBIEN) 10 MG tablet Take 15 mg by mouth at bedtime. 07/31/19  Yes [provider]  aspirin EC 81 MG tablet Take 81 mg by mouth daily.    [provider]  Blood Glucose Monitoring Suppl (ACCU-CHEK AVIVA PLUS) w/Device KIT Apply 1 Dose topically daily. 04/07/19   [provider]  nitroGLYCERIN (NITROLINGUAL) 0.4 MG/SPRAY spray Place 1 spray under the tongue every 5 (five) minutes x 3 doses as needed for chest pain. 04/24/20   Richardo Priest, MD  Wheat  Dextrin (BENEFIBER) POWD Take 1 Scoop by mouth daily. 08/23/20   [provider]   Allergies  Allergen Reactions  . Shellfish Allergy Swelling    Of mouth only  . Sulfa Antibiotics Other (See Comments)    Internal bleeding   Review of Systems  Respiratory: Negative for shortness of breath.     Physical Exam Vitals reviewed.  Constitutional:      General: She is not in acute distress.    Comments: Chronically ill-appearing  Cardiovascular:     Rate and Rhythm: Normal rate.  Pulmonary:     Effort: Pulmonary effort is normal.  Neurological:     Mental Status: She is alert.     Comments: Oriented to person, place, and time but not always to situation.      Vital Signs: BP 115/60   Pulse 69   Temp 98 F (36.7 C) (Oral)   Resp (!) 26   Ht _0  (1.626 m)   Wt 64 kg   SpO2 97%   BMI 24.22 kg/m  Pain Scale: 0-10   Pain Score: 0-No pain   SpO2: SpO2: 97 % O2 Device:SpO2: 97 %  IO: Intake/output summary:   Intake/Output Summary (Last 24 hours) at 10/12/2020 1126 Last data filed at 10/12/2020 2951 Gross per 24 hour  Intake 750 ml  Output 300 ml  Net 450 ml    LBM:   Baseline Weight: Weight: 64 kg Most recent weight: Weight: 64 kg      Palliative Assessment/Data: PPS 40%     Time In: 11:26 Time Out: 12:40 Time Total: 74 minutes Greater than 50%  of this time was spent counseling and coordinating care related to the above assessment and plan.  Signed by: Lavena Bullion, NP   Please contact Palliative Medicine Team phone at [phone] for questions and concerns.  For individual provider: See Shea Evans

## 2020-10-12 NOTE — Progress Notes (Signed)
PROGRESS NOTE    Jenna West  UVO:[id number] DOB: [birth year] DOA: 10/11/2020 PCP: Ronita Hipps, MD    Brief Narrative:  75 y.o. female hypothyroidism, chronic systolic heart failure Ef 20 %, CAD, hypertension, seizure, CKD stage IIIb, diabetes, IBS recent admission 1/18 for acute on chronic systolic heart failure exacerbation and rectal bleeding.  During last admission palliative care was recommended for patient due to her end-stage heart failure,  CKD and dementia. She was thought not to be a candidate for endoscopy colonoscopy during last admission.  Patient presented via EMS, patient was found in bed unresponsive, not breathing normally per  husband.  EMS was called.  On EMS arrival oxygen saturation was 83 on room air.  Patient received ventilation and subsequently she became more alert.  Was placed on nonrebreather mask.  And subsequently transitioned to 5 L in the ED.  Evaluation in the ED; patient was found to be hypotensive systolic blood pressure 74/25.  She received IV bolus which improved her blood pressure 100/74.  She was placed on 5 L of oxygen. Covid PCR test came back positive.  Chest x-ray; increase in perihilar airspace disease suggests flash pulmonary edema, right pleural effusion.  Sodium 134, creatinine 2.6, BNP 2652, lactic acid 3.0, white blood cell 3.5, hemoglobin 13, platelet 97, INR 1.3..  Assessment & Plan:   Active Problems:   Acquired hypothyroidism   Cardiomyopathy ([place])   ICD (implantable cardioverter-defibrillator) in place   Type 2 diabetes mellitus with stage 3 chronic kidney disease, with long-term current use of insulin (HCC)   Acute on chronic systolic CHF (congestive heart failure) (HCC)   Seizure (HCC)   Pneumonia due to COVID-19 virus   Acute hypercapnic respiratory failure ([place])   1-Acute hypoxic respiratory Failure; Covid 19 PNA Initially presented hypoxemic, requiring 5 L oxygen.  Chest x ray with pulmonary edema and pleural effusion.   COVID pos Started on Remdesivir and IV steroids.  Procal is neg. CRP is 2.0 Continue guaifenesin and Albuterol inhaler.   2-Acute systolic Heart failure exacerbation;  Continue with IV lasix, as BP allows.  Pt was started on midodrine to help with BP.  Dr. Tyrell Antonio discussed with sister, no Central line, picc line or IV pressors.  Palliative care consulted and is following Holding entresto due to AKI and hypotension. Cont to hold coreg.   3-VT; continue with amiodarone as tolerated.   4-Hypotension, lactic acidosis;  Suspect related to covid and or cardiogenic shock.  No IV pressors per family  Currently on Midodrine.  Continue with Cefepime Follow Blood culture, thus far neg  5-Leukopenia, thrombocytopenia. Related to viral illness. Recheck cbc in AM   6-AKI on CKD stage IIIb; Prior Cr 1.8--2.1 Presents with cr at 2.6.  Suspect cardio renal syndrome plus hypotension.  Currently on IV lasix Will recheck bmet in AM  DVT prophylaxis: Lovenox subq Code Status: DNR Family Communication: Pt in room, family not at bedside  Status is: Inpatient  Remains inpatient appropriate because:IV treatments appropriate due to intensity of illness or inability to take PO and Inpatient level of care appropriate due to severity of illness   Dispo: The patient is from: Home              Anticipated d/c is to: Home              Anticipated d/c date is: 3 days              Patient currently is not medically stable  to d/c.   Difficult to place patient No       Consultants:   Palliative Care  Procedures:     Antimicrobials: Anti-infectives (From admission, onward)   Start     Dose/Rate Route Frequency Ordered Stop   10/12/20 1500  ceFEPIme (MAXIPIME) 2 g in sodium chloride 0.9 % 100 mL IVPB        2 g 200 mL/hr over 30 Minutes Intravenous Every 24 hours 10/11/20 1638     10/12/20 1000  remdesivir 100 mg in sodium chloride 0.9 % 100 mL IVPB       "Followed by" Linked Group  Details   100 mg 200 mL/hr over 30 Minutes Intravenous Daily 10/11/20 1631 10/16/20 0959   10/11/20 1645  remdesivir 200 mg in sodium chloride 0.9% 250 mL IVPB       "Followed by" Linked Group Details   200 mg 580 mL/hr over 30 Minutes Intravenous Once 10/11/20 1631 10/11/20 1845   10/11/20 1637  vancomycin variable dose per unstable renal function (pharmacist dosing)  Status:  Discontinued         Does not apply See admin instructions 10/11/20 1638 10/11/20 1722   10/11/20 1445  vancomycin (VANCOREADY) IVPB 1250 mg/250 mL  Status:  Discontinued        1,250 mg 166.7 mL/hr over 90 Minutes Intravenous  Once 10/11/20 1432 10/11/20 1722   10/11/20 1430  ceFEPIme (MAXIPIME) 2 g in sodium chloride 0.9 % 100 mL IVPB        2 g 200 mL/hr over 30 Minutes Intravenous  Once 10/11/20 1416 10/11/20 1517   10/11/20 1430  metroNIDAZOLE (FLAGYL) IVPB 500 mg        500 mg 100 mL/hr over 60 Minutes Intravenous  Once 10/11/20 1416 10/11/20 1556   10/11/20 1430  vancomycin (VANCOCIN) IVPB 1000 mg/200 mL premix  Status:  Discontinued        1,000 mg 200 mL/hr over 60 Minutes Intravenous  Once 10/11/20 1416 10/11/20 1432       Subjective: Reports breathing better this AM  Objective: Vitals:   10/12/20 1400 10/12/20 1530 10/12/20 1531 10/12/20 1602  BP: (!) 105/59 (!) 99/51 (!) 99/51   Pulse: 70 70 70   Resp: 19 (!) 24 (!) 24   Temp:   98.2 F (36.8 C)   TempSrc:   Oral   SpO2: 94% 95% 95%   Weight:    66.2 kg  Height:    5\' 4"  (1.626 m)    Intake/Output Summary (Last 24 hours) at 10/12/2020 1621 Last data filed at 10/12/2020 1532 Gross per 24 hour  Intake 250 ml  Output 1100 ml  Net -850 ml   Filed Weights   10/11/20 1408 10/12/20 1602  Weight: 64 kg 66.2 kg    Examination:  General exam: Appears calm and comfortable  Respiratory system: No audible wheezing. Respiratory effort normal. Cardiovascular system: perfused, no LE edema Gastrointestinal system: Abdomen is nondistended,  nontender Central nervous system: Alert and oriented. No focal neurological deficits. Extremities: Symmetric 5 x 5 power. Skin: No rashes, lesions  Psychiatry: Judgement and insight appear normal. Mood & affect appropriate.   Data Reviewed: I have personally reviewed following labs and imaging studies  CBC: Recent Labs  Lab 10/11/20 1428 10/12/20 0407  WBC 3.5* 1.8*  NEUTROABS 2.9 1.6*  HGB 13.1 13.3  HCT 43.2 41.3  MCV 99.1 95.8  PLT 97* 79*   Basic Metabolic Panel: Recent Labs  Lab 10/11/20 1428  10/12/20 0407  NA 134* 136  K 4.1 4.2  CL 97* 98  CO2 23 24  GLUCOSE 199* 174*  BUN 32* 33*  CREATININE 2.65* 2.51*  CALCIUM 7.8* 7.9*  MG  --  1.7   GFR: Estimated Creatinine Clearance: 18.4 mL/min (A) (by C-G formula based on SCr of 2.51 mg/dL (H)). Liver Function Tests: Recent Labs  Lab 10/11/20 1428 10/12/20 0407  AST 27 20  ALT 16 12  ALKPHOS 73 60  BILITOT 2.1* 2.2*  PROT 5.4* 5.4*  ALBUMIN 2.7* 2.6*   No results for input(s): LIPASE, AMYLASE in the last 168 hours. No results for input(s): AMMONIA in the last 168 hours. Coagulation Profile: Recent Labs  Lab 10/11/20 1535  INR 1.3*   Cardiac Enzymes: No results for input(s): CKTOTAL, CKMB, CKMBINDEX, TROPONINI in the last 168 hours. BNP (last 3 results) No results for input(s): PROBNP in the last 8760 hours. HbA1C: No results for input(s): HGBA1C in the last 72 hours. CBG: Recent Labs  Lab 10/11/20 1426  GLUCAP 187*   Lipid Profile: Recent Labs    10/11/20 1726  TRIG 55   Thyroid Function Tests: Recent Labs    10/11/20 1726  TSH 17.360*   Anemia Panel: Recent Labs    10/11/20 1726  FERRITIN 135   Sepsis Labs: Recent Labs  Lab 10/11/20 1428 10/11/20 1726 10/11/20 1728 10/12/20 0407  PROCALCITON  --  0.18  --  0.21  LATICACIDVEN 3.0*  --  1.9  --     Recent Results (from the past 240 hour(s))  Blood Culture (routine x 2)     Status: None (Preliminary result)   Collection  Time: 10/11/20  2:28 PM   Specimen: BLOOD  Result Value Ref Range Status   Specimen Description BLOOD RIGHT ANTECUBITAL  Final   Special Requests   Final    BOTTLES DRAWN AEROBIC AND ANAEROBIC Blood Culture results may not be optimal due to an inadequate volume of blood received in culture bottles   Culture   Final    NO GROWTH < 24 HOURS Performed at [facility] [address], [place], [place] [address]    Report Status PENDING  Incomplete  Blood Culture (routine x 2)     Status: None (Preliminary result)   Collection Time: 10/11/20  2:39 PM   Specimen: BLOOD  Result Value Ref Range Status   Specimen Description BLOOD SITE NOT SPECIFIED  Final   Special Requests   Final    BOTTLES DRAWN AEROBIC AND ANAEROBIC Blood Culture results may not be optimal due to an inadequate volume of blood received in culture bottles   Culture   Final    NO GROWTH < 24 HOURS Performed at [facility], [place] [address], [place], [place] [address]    Report Status PENDING  Incomplete  SARS Coronavirus 2 by RT PCR (hospital order, performed in [facility] hospital lab) Nasopharyngeal Nasopharyngeal Swab     Status: Abnormal   Collection Time: 10/11/20  2:52 PM   Specimen: Nasopharyngeal Swab  Result Value Ref Range Status   SARS Coronavirus 2 POSITIVE (A) NEGATIVE Final    Comment: emailed L. Berdik RN 16:00 10/11/20 ([email or web address]) (NOTE) SARS-CoV-2 target nucleic acids are DETECTED  SARS-CoV-2 RNA is generally detectable in upper respiratory specimens  during the acute phase of infection.  Positive results are indicative  of the presence of the identified virus, but do not rule out bacterial infection or co-infection with other  pathogens not detected by the test.  Clinical correlation with patient history and  other diagnostic information is necessary to determine patient infection status.  The expected result is negative.  Fact Sheet for Patients:    StrictlyIdeas.no   Fact Sheet for Healthcare Providers:   BankingDealers.co.za    This test is not yet approved or cleared by the [place] FDA and  has been authorized for detection and/or diagnosis of SARS-CoV-2 by FDA under an Emergency Use Authorization (EUA).  This EUA will remain in effect (meaning this test can be used) for the duration of  the  COVID-19 declaration under Section 564(b)(1) of the Act, 21 U.S.C. section 360-bbb-3(b)(1), unless the authorization is terminated or revoked sooner.  Performed at [facility], [place] [address] [place], [place] [address]      Radiology Studies: DG Chest Port 1 View  Result Date: 10/11/2020 CLINICAL DATA:  Short of breath EXAM: PORTABLE CHEST 1 VIEW COMPARISON:  09/27/2020 FINDINGS: Stable large cardiac silhouette. Interval increase in perihilar diffuse airspace disease. Bilateral pleural effusions. No pneumothorax. Breast prosthetic noted. LEFT chest wall pacemaker. IMPRESSION: Increase in perihilar airspace disease suggest flash pulmonary edema RIGHT pleural effusion. Electronically Signed   By: Suzy Bouchard M.D.   On: 10/11/2020 14:59    Scheduled Meds: . albuterol  1 puff Inhalation Q6H  . amiodarone  200 mg Oral Daily  . vitamin C  500 mg Oral Daily  . atorvastatin  80 mg Oral QHS  . enoxaparin (LOVENOX) injection  30 mg Subcutaneous Q24H  . furosemide  40 mg Intravenous Q12H  . guaiFENesin  600 mg Oral BID  . levothyroxine  100 mcg Oral QAC breakfast  . methylPREDNISolone (SOLU-MEDROL) injection  0.5 mg/kg Intravenous Q12H   Followed by  . [START ON 10/14/2020] predniSONE  50 mg Oral Daily  . midodrine  5 mg Oral TID WC  . pantoprazole  20 mg Oral Daily  . ranolazine  500 mg Oral Daily  . sertraline  50 mg Oral Daily  . sodium chloride flush  3 mL Intravenous Q12H  . [START ON 10/18/2020] Vitamin D (Ergocalciferol)  50,000 Units Oral Q Wed  . zinc sulfate  220 mg  Oral Daily   Continuous Infusions: . sodium chloride    . ceFEPime (MAXIPIME) IV Stopped (10/12/20 1532)  . remdesivir 100 mg in NS 100 mL Stopped (10/12/20 0955)     LOS: 1 day   Marylu Lund, MD [facility] Pager On Amion  If 7PM-7AM, please contact night-coverage 10/12/2020, 4:21 PM

## 2020-10-12 NOTE — Progress Notes (Signed)
   Pt was enrolled on Monday 1/24 to [facility].  Pt lives at home with spouse and step-dtrClaiborne Billings and sister-[name] are a great support to her.  Will plan to resume support of pt at home upon d/c.  Please notify [facility] if we can assist with d/c planning.  Thank you Wynetta Fines, RN Office [phone] Mobile [phone]

## 2020-10-12 NOTE — Consult Note (Signed)
WOC Nurse Consult Note: Patient care given in [facility] ED027 Patient Covid positive Reason for Consult: "wound care" Wound type: Right anterior foot has a small abrasion covered with a bandaid. Measures 0.3 cm x 0.2 cm. Crusted over with dried blood Left heel is crusted with what appears to be dirt and possibly a healed wound. Intergluteal cleft has a small fissure that measures 0.3 cm x 0.1 cm yellow.  Pressure Injury POA: Yes Drainage (amount, consistency, odor)  Periwound: sacrum is red/pink from sitting but is blanchable at this point.  Dressing procedure/placement/frequency: Turn patient  Every 2-4 hours to keep fissure in the intergluteal cleft from evolving. Continue sacral foam. Look under foam q shift and document findings. Change q 3 days or PRN soiling.  Wash both feet with soap and water, rinse and dry. Apply new bandaid to the anterior aspect of the right foot. Apply new heel foam dressings to both heels and place in Prevalon boots. Change foam dressings q 3 days. Lift foam dressings and assess heels q shift.   Monitor the wound area(s) for worsening of condition such as: Signs/symptoms of infection, increase in size, development of or worsening of odor, development of pain, or increased pain at the affected locations.   Notify the medical team if any of these develop.  Thank you for the consult. [facility] nurse will not follow at this time.   Please re-consult the [facility] team if needed.  Cathlean Marseilles Tamala Julian, MSN, RN, [facility], Lysle Pearl, [facility] Wound Treatment Associate Pager [phone]

## 2020-10-12 NOTE — ED Notes (Signed)
Attempted to call report x 1  

## 2020-10-12 NOTE — Consult Note (Signed)
WOC Nurse Consult Note: Patient receiving care in [place].  Patient is COVID + and on [place] prior to being brought to the [facility] ED. Reason for Consult: "wound care nurse" order entered by Elmarie Shiley, MD on 10/11/20 Wound type: I have reviewed the patient record and cannot find in any notes wounds or other conditions to indicate the reason for the consult.  I have sent a Secure Chat to Dr. Marylu Lund, MD attending per Epic record asking the reason for the consult. He has responded he will specify once he knows the answer. Val Riles, RN, MSN, CWOCN, CNS-BC, pager [phone]

## 2020-10-13 DIAGNOSIS — I5023 Acute on chronic systolic (congestive) heart failure: Secondary | ICD-10-CM | POA: Diagnosis not present

## 2020-10-13 DIAGNOSIS — J9602 Acute respiratory failure with hypercapnia: Secondary | ICD-10-CM

## 2020-10-13 LAB — URINE CULTURE: Culture: NO GROWTH

## 2020-10-13 LAB — COMPREHENSIVE METABOLIC PANEL
ALT: 12 U/L (ref 0–44)
AST: 19 U/L (ref 15–41)
Albumin: 2.7 g/dL — ABNORMAL LOW (ref 3.5–5.0)
Alkaline Phosphatase: 58 U/L (ref 38–126)
Anion gap: 14 (ref 5–15)
BUN: 36 mg/dL — ABNORMAL HIGH (ref 8–23)
CO2: 27 mmol/L (ref 22–32)
Calcium: 8 mg/dL — ABNORMAL LOW (ref 8.9–10.3)
Chloride: 97 mmol/L — ABNORMAL LOW (ref 98–111)
Creatinine, Ser: 2.6 mg/dL — ABNORMAL HIGH (ref 0.44–1.00)
GFR, Estimated: 19 mL/min — ABNORMAL LOW (ref >60)
Glucose, Bld: 177 mg/dL — ABNORMAL HIGH (ref 70–99)
Potassium: 4 mmol/L (ref 3.5–5.1)
Sodium: 138 mmol/L (ref 135–145)
Total Bilirubin: 2 mg/dL — ABNORMAL HIGH (ref 0.3–1.2)
Total Protein: 5.6 g/dL — ABNORMAL LOW (ref 6.5–8.1)

## 2020-10-13 LAB — CBC WITH DIFFERENTIAL/PLATELET
Abs Immature Granulocytes: 0.02 10*3/uL (ref 0.00–0.07)
Basophils Absolute: 0 10*3/uL (ref 0.0–0.1)
Basophils Relative: 0 %
Eosinophils Absolute: 0 10*3/uL (ref 0.0–0.5)
Eosinophils Relative: 0 %
HCT: 43.1 % (ref 36.0–46.0)
Hemoglobin: 13.6 g/dL (ref 12.0–15.0)
Immature Granulocytes: 0 %
Lymphocytes Relative: 7 %
Lymphs Abs: 0.4 10*3/uL — ABNORMAL LOW (ref 0.7–4.0)
MCH: 30.2 pg (ref 26.0–34.0)
MCHC: 31.6 g/dL (ref 30.0–36.0)
MCV: 95.8 fL (ref 80.0–100.0)
Monocytes Absolute: 0.2 10*3/uL (ref 0.1–1.0)
Monocytes Relative: 3 %
Neutro Abs: 5.1 10*3/uL (ref 1.7–7.7)
Neutrophils Relative %: 90 %
Platelets: 129 10*3/uL — ABNORMAL LOW (ref 150–400)
RBC: 4.5 MIL/uL (ref 3.87–5.11)
RDW: 18.7 % — ABNORMAL HIGH (ref 11.5–15.5)
WBC: 5.7 10*3/uL (ref 4.0–10.5)
nRBC: 0 % (ref 0.0–0.2)

## 2020-10-13 LAB — C-REACTIVE PROTEIN: CRP: 1.4 mg/dL — ABNORMAL HIGH (ref <1.0)

## 2020-10-13 LAB — D-DIMER, QUANTITATIVE: D-Dimer, Quant: 1.62 ug/mL-FEU — ABNORMAL HIGH (ref 0.00–0.50)

## 2020-10-13 LAB — MAGNESIUM: Magnesium: 1.7 mg/dL (ref 1.7–2.4)

## 2020-10-13 LAB — PROCALCITONIN: Procalcitonin: 0.2 ng/mL

## 2020-10-13 MED ORDER — ASPIRIN EC 81 MG PO TBEC
81.0000 mg | DELAYED_RELEASE_TABLET | Freq: Every day | ORAL | Status: DC
  Administered 2020-10-13 – 2020-10-17 (×5): 81 mg via ORAL
  Filled 2020-10-13 (×5): qty 1

## 2020-10-13 MED ORDER — CLOPIDOGREL BISULFATE 75 MG PO TABS
75.0000 mg | ORAL_TABLET | Freq: Every day | ORAL | Status: DC
  Administered 2020-10-13 – 2020-10-17 (×5): 75 mg via ORAL
  Filled 2020-10-13 (×5): qty 1

## 2020-10-13 MED ORDER — ENSURE ENLIVE PO LIQD
237.0000 mL | Freq: Three times a day (TID) | ORAL | Status: DC
  Administered 2020-10-13 – 2020-10-17 (×6): 237 mL via ORAL

## 2020-10-13 MED ORDER — ADULT MULTIVITAMIN W/MINERALS CH
1.0000 | ORAL_TABLET | Freq: Every day | ORAL | Status: DC
  Administered 2020-10-13 – 2020-10-17 (×5): 1 via ORAL
  Filled 2020-10-13 (×5): qty 1

## 2020-10-13 NOTE — Progress Notes (Signed)
PROGRESS NOTE    Jenna West  WUJ:[id number] DOB: 07-07-1946 DOA: 10/11/2020 PCP: Ronita Hipps, MD    Brief Narrative:  75 y.o. female hypothyroidism, chronic systolic heart failure Ef 20 %, CAD, hypertension, seizure, CKD stage IIIb, diabetes, IBS recent admission 1/18 for acute on chronic systolic heart failure exacerbation and rectal bleeding.  During last admission palliative care was recommended for patient due to her end-stage heart failure,  CKD and dementia. She was thought not to be a candidate for endoscopy colonoscopy during last admission.  Patient presented via EMS, patient was found in bed unresponsive, not breathing normally per  husband.  EMS was called.  On EMS arrival oxygen saturation was 83 on room air.  Patient received ventilation and subsequently she became more alert.  Was placed on nonrebreather mask.  And subsequently transitioned to 5 L in the ED.  Evaluation in the ED; patient was found to be hypotensive systolic blood pressure 95/62.  She received IV bolus which improved her blood pressure 100/74.  She was placed on 5 L of oxygen. Covid PCR test came back positive.  Chest x-ray; increase in perihilar airspace disease suggests flash pulmonary edema, right pleural effusion.  Sodium 134, creatinine 2.6, BNP 2652, lactic acid 3.0, white blood cell 3.5, hemoglobin 13, platelet 97, INR 1.3..  Assessment & Plan:   Active Problems:   Acquired hypothyroidism   Cardiomyopathy ([place])   ICD (implantable cardioverter-defibrillator) in place   Type 2 diabetes mellitus with stage 3 chronic kidney disease, with long-term current use of insulin (HCC)   Acute on chronic systolic CHF (congestive heart failure) (HCC)   Seizure (HCC)   Pneumonia due to COVID-19 virus   Acute hypercapnic respiratory failure ([place])   1-Acute hypoxic respiratory Failure; Covid 19 PNA Initially presented hypoxemic, requiring 5 L oxygen.  Chest x ray with pulmonary edema and pleural effusion.   COVID pos Started on Remdesivir and IV steroids.  Procal is neg. CRP is 1.4, down to [facility], cont to wean O2. Pt is O2 naive at baseline Continue guaifenesin and Albuterol inhaler.   2-Acute systolic Heart failure exacerbation;  Continue with IV lasix, as BP allows.  Pt was started on midodrine to help with BP.  Dr. Tyrell Antonio discussed with sister, no Central line, picc line or IV pressors.  Palliative care consulted and continues to follow Holding entresto due to AKI and hypotension. Cont to hold coreg for now  3-VT; continue with amiodarone as tolerated.   4-Hypotension, lactic acidosis;  Suspect related to covid and or cardiogenic shock.  No IV pressors per family  Currently on Midodrine.  Continued with Cefepime Follow Blood culture, thus far neg  5-Leukopenia, thrombocytopenia. Related to viral illness. Cont to follow cbc trends   6-AKI on CKD stage IIIb; Prior Cr 1.8--2.1 Presents with cr at 2.6.  Suspect cardio renal syndrome plus hypotension.  Tolerating IV lasix Recheck bmet in AM  DVT prophylaxis: Lovenox subq Code Status: DNR Family Communication: Pt in room, family not at bedside  Status is: Inpatient  Remains inpatient appropriate because:IV treatments appropriate due to intensity of illness or inability to take PO and Inpatient level of care appropriate due to severity of illness   Dispo: The patient is from: Home              Anticipated d/c is to: Home              Anticipated d/c date is: 3 days  Patient currently is not medically stable to d/c.   Difficult to place patient No   Consultants:   Palliative Care  Procedures:     Antimicrobials: Anti-infectives (From admission, onward)   Start     Dose/Rate Route Frequency Ordered Stop   10/12/20 1500  ceFEPIme (MAXIPIME) 2 g in sodium chloride 0.9 % 100 mL IVPB        2 g 200 mL/hr over 30 Minutes Intravenous Every 24 hours 10/11/20 1638     10/12/20 1000  remdesivir 100 mg in  sodium chloride 0.9 % 100 mL IVPB       "Followed by" Linked Group Details   100 mg 200 mL/hr over 30 Minutes Intravenous Daily 10/11/20 1631 10/16/20 0959   10/11/20 1645  remdesivir 200 mg in sodium chloride 0.9% 250 mL IVPB       "Followed by" Linked Group Details   200 mg 580 mL/hr over 30 Minutes Intravenous Once 10/11/20 1631 10/11/20 1845   10/11/20 1637  vancomycin variable dose per unstable renal function (pharmacist dosing)  Status:  Discontinued         Does not apply See admin instructions 10/11/20 1638 10/11/20 1722   10/11/20 1445  vancomycin (VANCOREADY) IVPB 1250 mg/250 mL  Status:  Discontinued        1,250 mg 166.7 mL/hr over 90 Minutes Intravenous  Once 10/11/20 1432 10/11/20 1722   10/11/20 1430  ceFEPIme (MAXIPIME) 2 g in sodium chloride 0.9 % 100 mL IVPB        2 g 200 mL/hr over 30 Minutes Intravenous  Once 10/11/20 1416 10/11/20 1517   10/11/20 1430  metroNIDAZOLE (FLAGYL) IVPB 500 mg        500 mg 100 mL/hr over 60 Minutes Intravenous  Once 10/11/20 1416 10/11/20 1556   10/11/20 1430  vancomycin (VANCOCIN) IVPB 1000 mg/200 mL premix  Status:  Discontinued        1,000 mg 200 mL/hr over 60 Minutes Intravenous  Once 10/11/20 1416 10/11/20 1432      Subjective: States breathing somewhat better   Objective: Vitals:   10/13/20 0847 10/13/20 1000 10/13/20 1030 10/13/20 1200  BP: 110/65 (!) 111/58  106/74  Pulse: 69 70 70 69  Resp: 19 18 18 16   Temp: 97.9 F (36.6 C)   97.7 F (36.5 C)  TempSrc: Oral   Oral  SpO2: 95% 98% 97% 100%  Weight:      Height:        Intake/Output Summary (Last 24 hours) at 10/13/2020 1625 Last data filed at 10/13/2020 1500 Gross per 24 hour  Intake 778.19 ml  Output 900 ml  Net -121.81 ml   Filed Weights   10/11/20 1408 10/12/20 1602 10/13/20 0500  Weight: 64 kg 66.2 kg 67.1 kg    Examination: General exam: Awake, laying in bed, in nad Respiratory system: Normal respiratory effort, no wheezing Cardiovascular system:  regular rate, s1, s2 Gastrointestinal system: Soft, nondistended, positive BS Central nervous system: CN2-12 grossly intact, strength intact Extremities: Perfused, no clubbing Skin: Normal skin turgor, no notable skin lesions seen Psychiatry: Mood normal // no visual hallucinations   Data Reviewed: I have personally reviewed following labs and imaging studies  CBC: Recent Labs  Lab 10/11/20 1428 10/12/20 0407 10/13/20 0456  WBC 3.5* 1.8* 5.7  NEUTROABS 2.9 1.6* 5.1  HGB 13.1 13.3 13.6  HCT 43.2 41.3 43.1  MCV 99.1 95.8 95.8  PLT 97* 79* 094*   Basic Metabolic Panel: Recent Labs  Lab 10/11/20 1428 10/12/20 0407 10/13/20 0456  NA 134* 136 138  K 4.1 4.2 4.0  CL 97* 98 97*  CO2 23 24 27   GLUCOSE 199* 174* 177*  BUN 32* 33* 36*  CREATININE 2.65* 2.51* 2.60*  CALCIUM 7.8* 7.9* 8.0*  MG  --  1.7 1.7   GFR: Estimated Creatinine Clearance: 17.9 mL/min (A) (by C-G formula based on SCr of 2.6 mg/dL (H)). Liver Function Tests: Recent Labs  Lab 10/11/20 1428 10/12/20 0407 10/13/20 0456  AST 27 20 19   ALT 16 12 12   ALKPHOS 73 60 58  BILITOT 2.1* 2.2* 2.0*  PROT 5.4* 5.4* 5.6*  ALBUMIN 2.7* 2.6* 2.7*   No results for input(s): LIPASE, AMYLASE in the last 168 hours. No results for input(s): AMMONIA in the last 168 hours. Coagulation Profile: Recent Labs  Lab 10/11/20 1535  INR 1.3*   Cardiac Enzymes: No results for input(s): CKTOTAL, CKMB, CKMBINDEX, TROPONINI in the last 168 hours. BNP (last 3 results) No results for input(s): PROBNP in the last 8760 hours. HbA1C: No results for input(s): HGBA1C in the last 72 hours. CBG: Recent Labs  Lab 10/11/20 1426  GLUCAP 187*   Lipid Profile: Recent Labs    10/11/20 1726  TRIG 55   Thyroid Function Tests: Recent Labs    10/11/20 1726  TSH 17.360*   Anemia Panel: Recent Labs    10/11/20 1726  FERRITIN 135   Sepsis Labs: Recent Labs  Lab 10/11/20 1428 10/11/20 1726 10/11/20 1728 10/12/20 0407  10/13/20 0456  PROCALCITON  --  0.18  --  0.21 0.20  LATICACIDVEN 3.0*  --  1.9  --   --     Recent Results (from the past 240 hour(s))  Urine culture     Status: None   Collection Time: 10/11/20  5:15 AM   Specimen: In/Out Cath Urine  Result Value Ref Range Status   Specimen Description IN/OUT CATH URINE  Final   Special Requests NONE  Final   Culture   Final    NO GROWTH Performed at [facility], [place] [address], [place], [place] [address]    Report Status 10/13/2020 FINAL  Final  Blood Culture (routine x 2)     Status: None (Preliminary result)   Collection Time: 10/11/20  2:28 PM   Specimen: BLOOD  Result Value Ref Range Status   Specimen Description BLOOD RIGHT ANTECUBITAL  Final   Special Requests   Final    BOTTLES DRAWN AEROBIC AND ANAEROBIC Blood Culture results may not be optimal due to an inadequate volume of blood received in culture bottles   Culture   Final    NO GROWTH 2 DAYS Performed at [facility], [place] [address], [place], [place] [address]    Report Status PENDING  Incomplete  Blood Culture (routine x 2)     Status: None (Preliminary result)   Collection Time: 10/11/20  2:39 PM   Specimen: BLOOD  Result Value Ref Range Status   Specimen Description BLOOD SITE NOT SPECIFIED  Final   Special Requests   Final    BOTTLES DRAWN AEROBIC AND ANAEROBIC Blood Culture results may not be optimal due to an inadequate volume of blood received in culture bottles   Culture   Final    NO GROWTH 2 DAYS Performed at [facility], [place] [address] [place], [place] [address]    Report Status PENDING  Incomplete  SARS Coronavirus 2 by RT PCR (  hospital order, performed in [facility] hospital lab) Nasopharyngeal Nasopharyngeal Swab     Status: Abnormal   Collection Time: 10/11/20  2:52 PM   Specimen: Nasopharyngeal Swab  Result Value Ref Range Status   SARS Coronavirus 2 POSITIVE (A) NEGATIVE Final    Comment: emailed L. Berdik RN 16:00 10/11/20  ([email or web address]) (NOTE) SARS-CoV-2 target nucleic acids are DETECTED  SARS-CoV-2 RNA is generally detectable in upper respiratory specimens  during the acute phase of infection.  Positive results are indicative  of the presence of the identified virus, but do not rule out bacterial infection or co-infection with other pathogens not detected by the test.  Clinical correlation with patient history and  other diagnostic information is necessary to determine patient infection status.  The expected result is negative.  Fact Sheet for Patients:   StrictlyIdeas.no   Fact Sheet for Healthcare Providers:   BankingDealers.co.za    This test is not yet approved or cleared by the [place] FDA and  has been authorized for detection and/or diagnosis of SARS-CoV-2 by FDA under an Emergency Use Authorization (EUA).  This EUA will remain in effect (meaning this test can be used) for the duration of  the  COVID-19 declaration under Section 564(b)(1) of the Act, 21 U.S.C. section 360-bbb-3(b)(1), unless the authorization is terminated or revoked sooner.  Performed at [facility], [place] [address] [place], [place] [address]      Radiology Studies: No results found.  Scheduled Meds: . albuterol  1 puff Inhalation Q6H  . amiodarone  200 mg Oral Daily  . vitamin C  500 mg Oral Daily  . aspirin EC  81 mg Oral Daily  . atorvastatin  80 mg Oral QHS  . clopidogrel  75 mg Oral Daily  . enoxaparin (LOVENOX) injection  30 mg Subcutaneous Q24H  . feeding supplement  237 mL Oral TID BM  . furosemide  40 mg Intravenous Q12H  . guaiFENesin  600 mg Oral BID  . levothyroxine  100 mcg Oral QAC breakfast  . methylPREDNISolone (SOLU-MEDROL) injection  0.5 mg/kg Intravenous Q12H   Followed by  . [START ON 10/14/2020] predniSONE  50 mg Oral Daily  . midodrine  5 mg Oral TID WC  . multivitamin with minerals  1 tablet Oral Daily  . pantoprazole  20 mg Oral  Daily  . sertraline  50 mg Oral Daily  . sodium chloride flush  3 mL Intravenous Q12H  . [START ON 10/18/2020] Vitamin D (Ergocalciferol)  50,000 Units Oral Q Wed  . zinc sulfate  220 mg Oral Daily   Continuous Infusions: . sodium chloride    . ceFEPime (MAXIPIME) IV 2 g (10/13/20 1517)  . remdesivir 100 mg in NS 100 mL 100 mg (10/13/20 1025)     LOS: 2 days   Marylu Lund, MD [facility] Pager On Amion  If 7PM-7AM, please contact night-coverage 10/13/2020, 4:25 PM

## 2020-10-13 NOTE — Consult Note (Signed)
   [facility] Inpatient Consult   10/13/2020  Jenna West Nov 27, 1945 [id number]   [facility] [[facility]] Patient: [name]   Patient screened for extreme high score for unplanned readmission risk and for readmission in less than 30 days hospitalization. Also, reviewed to check for potential [facility] service for post hospital follow up needs.  Review of patient's medical record reveals patient is act.ive with [facility]  Primary Care Provider is Ronita Hipps, MD this provider is listed to provide the transition of care [TOC] for post hospital follow up.   Plan:  No follow up needed as patient post transition of care needs are planned with [facility] palliative program. Will follow until transition back to community.  For questions contact:   Natividad Brood, RN BSN [facility] Liaison  [phone] business mobile phone Toll free office [phone]  Fax number: [phone] [email or web address]

## 2020-10-13 NOTE — Progress Notes (Signed)
Attempted to call daughter to provide an update with patient's permission. Call unsuccessful. Will try again soon.

## 2020-10-13 NOTE — Evaluation (Signed)
Occupational Therapy Evaluation Patient Details Name: Jenna West MRN: [id number] DOB: [birth year] Today's Date: 10/13/2020    History of Present Illness 75 y.o. female hypothyroidism, chronic systolic heart failure Ef 20 %, CAD, hypertension, seizure, CKD stage IIIb, diabetes, IBS recent admission 1/18 for acute on chronic systolic heart failure exacerbation and rectal bleeding.  During last admission palliative care was recommended for patient due to her end-stage heart failure,  CKD and dementia.  Patient was found in bed unresponsive, not breathing normally per husband.   Clinical Impression   Patient admitted for the diagnosis above.  OT eval limited by nausea, unable to sit edge of bed for long, and deferring out of bed to recliner.  Nursing in room and gave her nausea meds.  OT to continue efforts in the acute setting to further assess functional abilities for an eventual return home with prior level of supports.      Follow Up Recommendations  No OT follow up    Equipment Recommendations  None recommended by OT    Recommendations for Other Services       Precautions / Restrictions Precautions Precautions: Fall;Other (comment) Precaution Comments: watch BP      Mobility Bed Mobility Overal bed mobility: Needs Assistance Bed Mobility: Supine to Sit;Sit to Supine     Supine to sit: Supervision Sit to supine: Supervision        Transfers                 General transfer comment: deferred due to nausea    Balance Overall balance assessment: Needs assistance Sitting-balance support: Single extremity supported Sitting balance-Leahy Scale: Fair                                     ADL either performed or assessed with clinical judgement   ADL                                               Vision Baseline Vision/History: Wears glasses;No visual deficits Wears Glasses: Reading only Patient Visual Report: No change from  baseline       Perception     Praxis      Pertinent Vitals/Pain Pain Assessment: No/denies pain     Hand Dominance Right   Extremity/Trunk Assessment Upper Extremity Assessment Upper Extremity Assessment: Generalized weakness   Lower Extremity Assessment Lower Extremity Assessment: Defer to PT evaluation   Cervical / Trunk Assessment Cervical / Trunk Assessment: Kyphotic   Communication Communication Communication: No difficulties   Cognition Arousal/Alertness: Awake/alert Behavior During Therapy: WFL for tasks assessed/performed Overall Cognitive Status: History of cognitive impairments - at baseline Area of Impairment: Memory;Problem solving                 Orientation Level: Time;Situation;Disoriented to   Memory: Decreased short-term memory Following Commands: Follows one step commands consistently Safety/Judgement: Decreased awareness of safety   Problem Solving: Requires verbal cues     General Comments       Exercises     Shoulder Instructions      Home Living Family/patient expects to be discharged to:: Private residence Living Arrangements: Spouse/significant other;Other relatives Available Help at Discharge: Family;Available 24 hours/day Type of Home: House Home Access: Stairs to enter [employer] of Steps: 1  Home Layout: Two level;Able to live on main level with bedroom/bathroom;Laundry or work area in basement   Alternate Level Stairs-Rails: None Bathroom Shower/Tub: Teacher, early years/pre: Standard     Home Equipment: Grab bars - toilet;Grab bars - tub/shower;Hand held Tourist information centre manager - 4 wheels;Cane - single point;Bedside commode          Prior Functioning/Environment Level of Independence: Needs assistance  Gait / Transfers Assistance Needed: Uses rollator for ambulation as of recently; reports 1 fall in last 6 months- unsure of accuracy. ADL's / Homemaking Assistance Needed: Does own ADLs/IADLs,  does not drive.            OT Problem List: Decreased activity tolerance;Decreased safety awareness      OT Treatment/Interventions: Self-care/ADL training;Therapeutic exercise;Balance training;Therapeutic activities    OT Goals(Current goals can be found in the care plan section) Acute Rehab OT Goals Patient Stated Goal: to get better and go home OT Goal Formulation: With patient Time For Goal Achievement: 10/27/20 Potential to Achieve Goals: Good ADL Goals Pt Will Perform Grooming: with set-up;standing Pt Will Perform Lower Body Bathing: with supervision;sit to/from stand Pt Will Perform Lower Body Dressing: with supervision;sit to/from stand Pt Will Transfer to Toilet: with min guard assist;ambulating;regular height toilet Pt Will Perform Toileting - Clothing Manipulation and hygiene: with supervision;sit to/from stand  OT Frequency: Min 2X/week   Barriers to D/C:    none noted       Co-evaluation              AM-PAC OT "6 Clicks" Daily Activity     Outcome Measure Help from another person eating meals?: None Help from another person taking care of personal grooming?: None Help from another person toileting, which includes using toliet, bedpan, or urinal?: A Little Help from another person bathing (including washing, rinsing, drying)?: A Little Help from another person to put on and taking off regular upper body clothing?: A Little Help from another person to put on and taking off regular lower body clothing?: A Little 6 Click Score: 20   End of Session    Activity Tolerance: Other (comment) Patient left: in bed;with call bell/phone within reach;with nursing/sitter in room  OT Visit Diagnosis: Unsteadiness on feet (R26.81)                Time: 6754-4920 OT Time Calculation (min): 17 min Charges:  OT General Charges $OT Visit: 1 Visit OT Evaluation $OT Eval Moderate Complexity: 1 Mod  10/13/2020  Rich, OTR/L  Acute Rehabilitation Services  Office:   [phone]   Metta Clines 10/13/2020, 10:34 AM

## 2020-10-13 NOTE — Progress Notes (Signed)
Initial Nutrition Assessment  DOCUMENTATION CODES:   Not applicable  INTERVENTION:   -Ensure Enlive po TID, each supplement provides 350 kcal and 20 grams of protein -MVI with minerals daiy -Magic cup TID with meals, each supplement provides 290 kcal and 9 grams of protein  NUTRITION DIAGNOSIS:   Increased nutrient needs related to acute illness (COVID-19) as evidenced by estimated needs.  GOAL:   Patient will meet greater than or equal to 90% of their needs  MONITOR:   Diet advancement,PO intake,Supplement acceptance,Labs,Weight trends,Skin,I & O's  REASON FOR ASSESSMENT:   Malnutrition Screening Tool    ASSESSMENT:   Jenna West is a 75 y.o. female hypothyroidism, chronic systolic heart failure Ef 20 %, CAD, hypertension, seizure, CKD stage IIIb, diabetes, IBS recent admission 1/18 for acute on chronic systolic heart failure exacerbation and rectal bleeding.  During last admission palliative care was recommended for patient due to her end-stage heart failure,  CKD and dementia. She was thought not to be a candidate for endoscopy colonoscopy during last admission.  Pt admitted with COVID-19 pneumonia.   Reviewed I/O's: -1.7 L x 24 hours and -1.3 L since admission  UOP: 1.7 L x 24 hours  Pt unavailable at time of visit.   Pt with poor appetite. Noted meal completion 20-40%. She is currently on a full liquid diet.   Reviewed wt hx; pt has experienced a 8.3% wt loss over the past 7 months. While this is not significant for time frame, it is concerning given advanced age and multiple cor-morbidities.  Palliative care following; pt is currently DNI/DNR with aggressive medical interventions. Plan is for watchful waiting.   Medications reviewed and include lasix, zinc sulfate, vitamin C, and solu-medrol.  Pt with poor oral intake and would benefit from nutrient dense supplement. One Ensure Enlive supplement provides 350 kcals, 20 grams protein, and 44-45 grams of  carbohydrate vs one Glucerna shake supplement, which provides 220 kcals, 10 grams of protein, and 26 grams of carbohydrate. Given pt's hx of DM, RD will reassess adequacy of PO intake, CBGS, and adjust supplement regimen as appropriate at follow-up.   Lab Results  Component Value Date   HGBA1C 6.6 (H) 06/18/2020   PTA DM medications are 10 units insulin glargine daily.   Labs reviewed.   Diet Order:   Diet Order            Diet full liquid Room service appropriate? Yes; Fluid consistency: Thin  Diet effective now                 EDUCATION NEEDS:   No education needs have been identified at this time  Skin:  Skin Assessment: Skin Integrity Issues: Skin Integrity Issues:: Stage II Stage II: sacrum  Last BM:  10/11/20  Height:   Ht Readings from Last 1 Encounters:  10/12/20 5\' 4"  (1.626 m)    Weight:   Wt Readings from Last 1 Encounters:  10/13/20 67.1 kg    Ideal Body Weight:  54.5 kg  BMI:  Body mass index is 25.39 kg/m.  Estimated Nutritional Needs:   Kcal:  2000-2200  Protein:  100-115 grams  Fluid:  > 2 L    Loistine Chance, RD, LDN, [place] Registered Dietitian II Certified Diabetes Care and Education Specialist Please refer to [facility] for RD and/or RD on-call/weekend/after hours pager

## 2020-10-13 NOTE — Progress Notes (Signed)
Attempted to call patient's husband upon patient's request to speak to him. Unable to reach him and unable to leave voicemail. Will try again in 30 mins.

## 2020-10-13 NOTE — Evaluation (Signed)
Physical Therapy Evaluation Patient Details Name: Jenna West MRN: [id number] DOB: 1946/03/29 Today's Date: 10/13/2020   History of Present Illness  75 y.o. female hypothyroidism, chronic systolic heart failure Ef 20 %, CAD, hypertension, seizure, CKD stage IIIb, diabetes, IBS recent admission 1/18 for acute on chronic systolic heart failure exacerbation and rectal bleeding.  During last admission palliative care was recommended for patient due to her end-stage heart failure,  CKD and dementia.  Patient was found in bed unresponsive, not breathing normally per husband.  Clinical Impression  Pt was seen for mobility on RW after having to work a bit to get OOB.  Pt is feeling weak, feeling limited in endurance and noting she is not sure what is happening to her.  Cannot remember her home number, but unclear if this is baseline or new.  Pt is not remembering details of home setup, not recalling how much help her husband gives her vs what she is able to do alone.  Follow for goals of PT and will have to rely on the information from the chart and her case management.  Reinforce safety and awareness of of limits.    Follow Up Recommendations Home health PT;Supervision for mobility/OOB    Equipment Recommendations  None recommended by PT    Recommendations for Other Services       Precautions / Restrictions Precautions Precautions: Fall Precaution Comments: watch BP and sats Restrictions Weight Bearing Restrictions: No      Mobility  Bed Mobility Overal bed mobility: Needs Assistance Bed Mobility: Supine to Sit;Sit to Supine     Supine to sit: Min guard Sit to supine: Min guard   General bed mobility comments: min guard to cue the sequence to EOB    Transfers Overall transfer level: Needs assistance Equipment used: Rolling walker (2 wheeled) Transfers: Sit to/from Stand Sit to Stand: Min guard         General transfer comment: cued hand  placement  Ambulation/Gait Ambulation/Gait assistance: Min guard Gait Distance (Feet): 5 Feet Assistive device: Rolling walker (2 wheeled) Gait Pattern/deviations: Step-to pattern;Wide base of support Gait velocity: decreased   General Gait Details: assisted on side of bed with pt reporting low energy and for the short trip O2 sats down to 94% on O2  Stairs            Wheelchair Mobility    Modified Rankin (Stroke Patients Only)       Balance Overall balance assessment: Needs assistance;History of Falls Sitting-balance support: Feet supported Sitting balance-Leahy Scale: Fair     Standing balance support: Bilateral upper extremity supported;During functional activity Standing balance-Leahy Scale: Fair Standing balance comment: less than fair dynamically                             Pertinent Vitals/Pain Pain Assessment: No/denies pain    Home Living Family/patient expects to be discharged to:: Private residence Living Arrangements: Spouse/significant other;Other relatives Available Help at Discharge: Family;Available 24 hours/day Type of Home: House Home Access: Stairs to enter Entrance Stairs-Rails: None Entrance Stairs-Number of Steps: 1 Home Layout: Two level;Able to live on main level with bedroom/bathroom;Laundry or work area in [employer]: Grab bars - toilet;Grab bars - tub/shower;Hand held Tourist information centre manager - 4 wheels;Cane - single point;Bedside commode Additional Comments: history from previous admit (pt is contradicting previous history)    Prior Function Level of Independence: Needs assistance   Gait / Transfers Assistance Needed: per pt  she was walking alone, history says rollator  ADL's / Homemaking Assistance Needed: per pt did her own care  Comments: pt is lethargic and not able to give much information similar to her previous stays     Hand Dominance   Dominant Hand: Right    Extremity/Trunk Assessment   Upper  Extremity Assessment Upper Extremity Assessment: Defer to OT evaluation    Lower Extremity Assessment Lower Extremity Assessment: Overall WFL for tasks assessed    Cervical / Trunk Assessment Cervical / Trunk Assessment: Kyphotic  Communication   Communication: No difficulties  Cognition Arousal/Alertness: Lethargic Behavior During Therapy: Flat affect Overall Cognitive Status: Impaired/Different from baseline Area of Impairment: Problem solving;Awareness;Safety/judgement;Following commands;Memory;Attention;Orientation                 Orientation Level: Situation;Time Current Attention Level: Selective Memory: Decreased recall of precautions;Decreased short-term memory Following Commands: Follows one step commands inconsistently;Follows one step commands with increased time Safety/Judgement: Decreased awareness of safety Awareness: Intellectual Problem Solving: Slow processing;Requires verbal cues;Requires tactile cues General Comments: repettive questions and cannot remember her home number, states she is husband's caregiver      General Comments General comments (skin integrity, edema, etc.): pt was seen for initiation of mobility and note her struggle with daily information details such as how her home is set up and what level of independence her husband has    Exercises     Assessment/Plan    PT Assessment Patient needs continued PT services  PT Problem List Decreased strength;Decreased range of motion;Decreased activity tolerance;Decreased balance;Decreased mobility;Decreased coordination;Decreased cognition;Decreased knowledge of use of DME;Decreased safety awareness;Cardiopulmonary status limiting activity       PT Treatment Interventions Gait training;Functional mobility training;Therapeutic activities;Therapeutic exercise;Balance training;Neuromuscular re-education;Patient/family education    PT Goals (Current goals can be found in the Care Plan section)   Acute Rehab PT Goals Patient Stated Goal: to feel better and go home PT Goal Formulation: Patient unable to participate in goal setting Time For Goal Achievement: 10/27/20 Potential to Achieve Goals: Fair    Frequency Min 3X/week   Barriers to discharge   per pt has family support 24/7    Co-evaluation               AM-PAC PT "6 Clicks" Mobility  Outcome Measure Help needed turning from your back to your side while in a flat bed without using bedrails?: A Little Help needed moving from lying on your back to sitting on the side of a flat bed without using bedrails?: A Little Help needed moving to and from a bed to a chair (including a wheelchair)?: A Little Help needed standing up from a chair using your arms (e.g., wheelchair or bedside chair)?: A Little Help needed to walk in hospital room?: A Little Help needed climbing 3-5 steps with a railing? : A Lot 6 Click Score: 17    End of Session Equipment Utilized During Treatment: Oxygen Activity Tolerance: Patient limited by fatigue;Treatment limited secondary to medical complications (Comment) Patient left: in bed;with call bell/phone within reach;with bed alarm set;with nursing/sitter in room Nurse Communication: Mobility status;Other (comment) (confusion) PT Visit Diagnosis: Unsteadiness on feet (R26.81);Muscle weakness (generalized) (M62.81);Difficulty in walking, not elsewhere classified (R26.2)    Time: 3557-3220 PT Time Calculation (min) (ACUTE ONLY): 24 min   Charges:   PT Evaluation $PT Eval Moderate Complexity: 1 Mod PT Treatments $Therapeutic Activity: 8-22 mins       Ramond Dial 10/13/2020, 1:30 PM  Mee Hives, PT MS Acute  Rehab Dept. Number: [place] [facility] [place]

## 2020-10-14 DIAGNOSIS — J9601 Acute respiratory failure with hypoxia: Secondary | ICD-10-CM | POA: Diagnosis not present

## 2020-10-14 DIAGNOSIS — J9602 Acute respiratory failure with hypercapnia: Secondary | ICD-10-CM | POA: Diagnosis not present

## 2020-10-14 DIAGNOSIS — N179 Acute kidney failure, unspecified: Secondary | ICD-10-CM | POA: Diagnosis not present

## 2020-10-14 DIAGNOSIS — J810 Acute pulmonary edema: Secondary | ICD-10-CM | POA: Diagnosis not present

## 2020-10-14 DIAGNOSIS — I5023 Acute on chronic systolic (congestive) heart failure: Secondary | ICD-10-CM | POA: Diagnosis not present

## 2020-10-14 DIAGNOSIS — U071 COVID-19: Secondary | ICD-10-CM | POA: Diagnosis not present

## 2020-10-14 LAB — CBC WITH DIFFERENTIAL/PLATELET
Abs Immature Granulocytes: 0.04 10*3/uL (ref 0.00–0.07)
Basophils Absolute: 0 10*3/uL (ref 0.0–0.1)
Basophils Relative: 0 %
Eosinophils Absolute: 0 10*3/uL (ref 0.0–0.5)
Eosinophils Relative: 0 %
HCT: 38.6 % (ref 36.0–46.0)
Hemoglobin: 12.8 g/dL (ref 12.0–15.0)
Immature Granulocytes: 1 %
Lymphocytes Relative: 7 %
Lymphs Abs: 0.5 10*3/uL — ABNORMAL LOW (ref 0.7–4.0)
MCH: 30.8 pg (ref 26.0–34.0)
MCHC: 33.2 g/dL (ref 30.0–36.0)
MCV: 93 fL (ref 80.0–100.0)
Monocytes Absolute: 0.4 10*3/uL (ref 0.1–1.0)
Monocytes Relative: 5 %
Neutro Abs: 6.6 10*3/uL (ref 1.7–7.7)
Neutrophils Relative %: 87 %
Platelets: 121 10*3/uL — ABNORMAL LOW (ref 150–400)
RBC: 4.15 MIL/uL (ref 3.87–5.11)
RDW: 18.9 % — ABNORMAL HIGH (ref 11.5–15.5)
WBC: 7.6 10*3/uL (ref 4.0–10.5)
nRBC: 0 % (ref 0.0–0.2)

## 2020-10-14 LAB — COMPREHENSIVE METABOLIC PANEL
ALT: 12 U/L (ref 0–44)
AST: 20 U/L (ref 15–41)
Albumin: 2.6 g/dL — ABNORMAL LOW (ref 3.5–5.0)
Alkaline Phosphatase: 58 U/L (ref 38–126)
Anion gap: 12 (ref 5–15)
BUN: 41 mg/dL — ABNORMAL HIGH (ref 8–23)
CO2: 30 mmol/L (ref 22–32)
Calcium: 8.2 mg/dL — ABNORMAL LOW (ref 8.9–10.3)
Chloride: 96 mmol/L — ABNORMAL LOW (ref 98–111)
Creatinine, Ser: 2.52 mg/dL — ABNORMAL HIGH (ref 0.44–1.00)
GFR, Estimated: 19 mL/min — ABNORMAL LOW (ref >60)
Glucose, Bld: 189 mg/dL — ABNORMAL HIGH (ref 70–99)
Potassium: 3.9 mmol/L (ref 3.5–5.1)
Sodium: 138 mmol/L (ref 135–145)
Total Bilirubin: 1.7 mg/dL — ABNORMAL HIGH (ref 0.3–1.2)
Total Protein: 5.4 g/dL — ABNORMAL LOW (ref 6.5–8.1)

## 2020-10-14 LAB — D-DIMER, QUANTITATIVE: D-Dimer, Quant: 2.27 ug/mL-FEU — ABNORMAL HIGH (ref 0.00–0.50)

## 2020-10-14 LAB — MAGNESIUM: Magnesium: 1.6 mg/dL — ABNORMAL LOW (ref 1.7–2.4)

## 2020-10-14 LAB — C-REACTIVE PROTEIN: CRP: 0.9 mg/dL (ref <1.0)

## 2020-10-14 MED ORDER — MAGNESIUM SULFATE 4 GM/100ML IV SOLN
4.0000 g | Freq: Once | INTRAVENOUS | Status: AC
  Administered 2020-10-14: 4 g via INTRAVENOUS
  Filled 2020-10-14: qty 100

## 2020-10-14 MED ORDER — MELATONIN 5 MG PO TABS
10.0000 mg | ORAL_TABLET | Freq: Every evening | ORAL | Status: DC | PRN
  Administered 2020-10-14 (×2): 10 mg via ORAL
  Filled 2020-10-14 (×2): qty 2

## 2020-10-14 NOTE — Progress Notes (Signed)
Called to the room pt throwing up and nauseated. Nausea medication given pt cleaned up. encouraged pt to eat before taking any medication.

## 2020-10-14 NOTE — Progress Notes (Signed)
PROGRESS NOTE    Jenna West  ZHG:[id number] DOB: Feb 08, 1946 DOA: 10/11/2020 PCP: Ronita Hipps, MD    Brief Narrative:  75 y.o. female hypothyroidism, chronic systolic heart failure Ef 20 %, CAD, hypertension, seizure, CKD stage IIIb, diabetes, IBS recent admission 1/18 for acute on chronic systolic heart failure exacerbation and rectal bleeding.  During last admission palliative care was recommended for patient due to her end-stage heart failure,  CKD and dementia. She was thought not to be a candidate for endoscopy colonoscopy during last admission.  Patient presented via EMS, patient was found in bed unresponsive, not breathing normally per  husband.  EMS was called.  On EMS arrival oxygen saturation was 83 on room air.  Patient received ventilation and subsequently she became more alert.  Was placed on nonrebreather mask.  And subsequently transitioned to 5 L in the ED.  Evaluation in the ED; patient was found to be hypotensive systolic blood pressure 19/62.  She received IV bolus which improved her blood pressure 100/74.  She was placed on 5 L of oxygen. Covid PCR test came back positive.  Chest x-ray; increase in perihilar airspace disease suggests flash pulmonary edema, right pleural effusion.  Sodium 134, creatinine 2.6, BNP 2652, lactic acid 3.0, white blood cell 3.5, hemoglobin 13, platelet 97, INR 1.3..  Assessment & Plan:   Active Problems:   Acquired hypothyroidism   Cardiomyopathy ([place])   ICD (implantable cardioverter-defibrillator) in place   Type 2 diabetes mellitus with stage 3 chronic kidney disease, with long-term current use of insulin (HCC)   Acute on chronic systolic CHF (congestive heart failure) (HCC)   Seizure (HCC)   Pneumonia due to COVID-19 virus   Acute hypercapnic respiratory failure ([place])   1-Acute hypoxic respiratory Failure; Covid 19 PNA Initially presented hypoxemic, requiring 5 L oxygen.  Chest x ray with pulmonary edema and pleural effusion.   COVID pos Pt was continued on Remdesivir and IV steroids.  Procal is neg. CRP is down to 0.9, Currently on 2LNC, cont to wean O2. Pt is O2 naive at baseline Continue guaifenesin and Albuterol inhaler.   2-Acute systolic Heart failure exacerbation;  Continue with IV lasix, as BP allows.  Pt was started on midodrine to help with BP.  Dr. Tyrell Antonio discussed with sister, no Central line, picc line or IV pressors.  Palliative care consulted and continues to follow Cont to hold entresto due to AKI and hypotension. Continuing to hold coreg for now  3-VT; continue with amiodarone as tolerated.   4-Hypotension, lactic acidosis;  Suspect related to covid and or cardiogenic shock.  No IV pressors per family  Continued on Midodrine.  Continued with Cefepime Blood cultures neg  5-Leukopenia, thrombocytopenia. Related to viral illness. Cont to follow cbc trends   6-AKI on CKD stage IIIb; Prior Cr 1.8--2.1 Presents with cr at 2.6.  Suspect cardio renal syndrome plus hypotension.  Tolerating IV lasix Repeat bmet in AM  DVT prophylaxis: Lovenox subq Code Status: DNR Family Communication: Pt in room, family not at bedside  Status is: Inpatient  Remains inpatient appropriate because:IV treatments appropriate due to intensity of illness or inability to take PO and Inpatient level of care appropriate due to severity of illness   Dispo: The patient is from: Home              Anticipated d/c is to: Home              Anticipated d/c date is: 3 days  Patient currently is not medically stable to d/c.   Difficult to place patient No   Consultants:   Palliative Care  Procedures:     Antimicrobials: Anti-infectives (From admission, onward)   Start     Dose/Rate Route Frequency Ordered Stop   10/12/20 1500  ceFEPIme (MAXIPIME) 2 g in sodium chloride 0.9 % 100 mL IVPB        2 g 200 mL/hr over 30 Minutes Intravenous Every 24 hours 10/11/20 1638     10/12/20 1000   remdesivir 100 mg in sodium chloride 0.9 % 100 mL IVPB       "Followed by" Linked Group Details   100 mg 200 mL/hr over 30 Minutes Intravenous Daily 10/11/20 1631 10/16/20 0959   10/11/20 1645  remdesivir 200 mg in sodium chloride 0.9% 250 mL IVPB       "Followed by" Linked Group Details   200 mg 580 mL/hr over 30 Minutes Intravenous Once 10/11/20 1631 10/11/20 1845   10/11/20 1637  vancomycin variable dose per unstable renal function (pharmacist dosing)  Status:  Discontinued         Does not apply See admin instructions 10/11/20 1638 10/11/20 1722   10/11/20 1445  vancomycin (VANCOREADY) IVPB 1250 mg/250 mL  Status:  Discontinued        1,250 mg 166.7 mL/hr over 90 Minutes Intravenous  Once 10/11/20 1432 10/11/20 1722   10/11/20 1430  ceFEPIme (MAXIPIME) 2 g in sodium chloride 0.9 % 100 mL IVPB        2 g 200 mL/hr over 30 Minutes Intravenous  Once 10/11/20 1416 10/11/20 1517   10/11/20 1430  metroNIDAZOLE (FLAGYL) IVPB 500 mg        500 mg 100 mL/hr over 60 Minutes Intravenous  Once 10/11/20 1416 10/11/20 1556   10/11/20 1430  vancomycin (VANCOCIN) IVPB 1000 mg/200 mL premix  Status:  Discontinued        1,000 mg 200 mL/hr over 60 Minutes Intravenous  Once 10/11/20 1416 10/11/20 1432      Subjective: Reports feeling weak  Objective: Vitals:   10/14/20 0600 10/14/20 0650 10/14/20 1000 10/14/20 1200  BP:  (!) 102/56 (!) 108/56 (!) 100/53  Pulse: 69 69 70 70  Resp: 16 16 16 15   Temp:  97.9 F (36.6 C)  98.7 F (37.1 C)  TempSrc:  Oral  Oral  SpO2: 98% 98% 95% 94%  Weight:      Height:        Intake/Output Summary (Last 24 hours) at 10/14/2020 1637 Last data filed at 10/14/2020 0600 Gross per 24 hour  Intake --  Output 950 ml  Net -950 ml   Filed Weights   10/11/20 1408 10/12/20 1602 10/13/20 0500  Weight: 64 kg 66.2 kg 67.1 kg    Examination: General exam: Conversant, in no acute distress Respiratory system: normal chest rise, clear, no audible  wheezing Cardiovascular system: regular rhythm, s1-s2 Gastrointestinal system: Nondistended, nontender, pos BS Central nervous system: No seizures, no tremors Extremities: No cyanosis, no joint deformities Skin: No rashes, no pallor Psychiatry: Affect normal // no auditory hallucinations   Data Reviewed: I have personally reviewed following labs and imaging studies  CBC: Recent Labs  Lab 10/11/20 1428 10/12/20 0407 10/13/20 0456 10/14/20 0348  WBC 3.5* 1.8* 5.7 7.6  NEUTROABS 2.9 1.6* 5.1 6.6  HGB 13.1 13.3 13.6 12.8  HCT 43.2 41.3 43.1 38.6  MCV 99.1 95.8 95.8 93.0  PLT 97* 79* 129* 121*   Basic  Metabolic Panel: Recent Labs  Lab 10/11/20 1428 10/12/20 0407 10/13/20 0456 10/14/20 0348  NA 134* 136 138 138  K 4.1 4.2 4.0 3.9  CL 97* 98 97* 96*  CO2 23 24 27 30   GLUCOSE 199* 174* 177* 189*  BUN 32* 33* 36* 41*  CREATININE 2.65* 2.51* 2.60* 2.52*  CALCIUM 7.8* 7.9* 8.0* 8.2*  MG  --  1.7 1.7 1.6*   GFR: Estimated Creatinine Clearance: 18.5 mL/min (A) (by C-G formula based on SCr of 2.52 mg/dL (H)). Liver Function Tests: Recent Labs  Lab 10/11/20 1428 10/12/20 0407 10/13/20 0456 10/14/20 0348  AST 27 20 19 20   ALT 16 12 12 12   ALKPHOS 73 60 58 58  BILITOT 2.1* 2.2* 2.0* 1.7*  PROT 5.4* 5.4* 5.6* 5.4*  ALBUMIN 2.7* 2.6* 2.7* 2.6*   No results for input(s): LIPASE, AMYLASE in the last 168 hours. No results for input(s): AMMONIA in the last 168 hours. Coagulation Profile: Recent Labs  Lab 10/11/20 1535  INR 1.3*   Cardiac Enzymes: No results for input(s): CKTOTAL, CKMB, CKMBINDEX, TROPONINI in the last 168 hours. BNP (last 3 results) No results for input(s): PROBNP in the last 8760 hours. HbA1C: No results for input(s): HGBA1C in the last 72 hours. CBG: Recent Labs  Lab 10/11/20 1426  GLUCAP 187*   Lipid Profile: Recent Labs    10/11/20 1726  TRIG 55   Thyroid Function Tests: Recent Labs    10/11/20 1726  TSH 17.360*   Anemia  Panel: Recent Labs    10/11/20 1726  FERRITIN 135   Sepsis Labs: Recent Labs  Lab 10/11/20 1428 10/11/20 1726 10/11/20 1728 10/12/20 0407 10/13/20 0456  PROCALCITON  --  0.18  --  0.21 0.20  LATICACIDVEN 3.0*  --  1.9  --   --     Recent Results (from the past 240 hour(s))  Urine culture     Status: None   Collection Time: 10/11/20  5:15 AM   Specimen: In/Out Cath Urine  Result Value Ref Range Status   Specimen Description IN/OUT CATH URINE  Final   Special Requests NONE  Final   Culture   Final    NO GROWTH Performed at [facility], [place] [address], [place], [place] [address]    Report Status 10/13/2020 FINAL  Final  Blood Culture (routine x 2)     Status: None (Preliminary result)   Collection Time: 10/11/20  2:28 PM   Specimen: BLOOD  Result Value Ref Range Status   Specimen Description BLOOD RIGHT ANTECUBITAL  Final   Special Requests   Final    BOTTLES DRAWN AEROBIC AND ANAEROBIC Blood Culture results may not be optimal due to an inadequate volume of blood received in culture bottles   Culture   Final    NO GROWTH 2 DAYS Performed at [facility], [place] [address] [place], [place] [address]    Report Status PENDING  Incomplete  Blood Culture (routine x 2)     Status: None (Preliminary result)   Collection Time: 10/11/20  2:39 PM   Specimen: BLOOD  Result Value Ref Range Status   Specimen Description BLOOD SITE NOT SPECIFIED  Final   Special Requests   Final    BOTTLES DRAWN AEROBIC AND ANAEROBIC Blood Culture results may not be optimal due to an inadequate volume of blood received in culture bottles   Culture   Final    NO GROWTH 2 DAYS Performed at [facility], [address], [place], [place] [address]    Report Status PENDING  Incomplete  SARS Coronavirus 2 by RT PCR (hospital order, performed in [facility] hospital lab) Nasopharyngeal Nasopharyngeal Swab     Status: Abnormal   Collection Time: 10/11/20  2:52 PM   Specimen:  Nasopharyngeal Swab  Result Value Ref Range Status   SARS Coronavirus 2 POSITIVE (A) NEGATIVE Final    Comment: emailed L. Berdik RN 16:00 10/11/20 ([email or web address]) (NOTE) SARS-CoV-2 target nucleic acids are DETECTED  SARS-CoV-2 RNA is generally detectable in upper respiratory specimens  during the acute phase of infection.  Positive results are indicative  of the presence of the identified virus, but do not rule out bacterial infection or co-infection with other pathogens not detected by the test.  Clinical correlation with patient history and  other diagnostic information is necessary to determine patient infection status.  The expected result is negative.  Fact Sheet for Patients:   StrictlyIdeas.no   Fact Sheet for Healthcare Providers:   BankingDealers.co.za    This test is not yet approved or cleared by the [place] FDA and  has been authorized for detection and/or diagnosis of SARS-CoV-2 by FDA under an Emergency Use Authorization (EUA).  This EUA will remain in effect (meaning this test can be used) for the duration of  the  COVID-19 declaration under Section 564(b)(1) of the Act, 21 U.S.C. section 360-bbb-3(b)(1), unless the authorization is terminated or revoked sooner.  Performed at [facility], [place] [address], [place], [place] [address]      Radiology Studies: No results found.  Scheduled Meds: . albuterol  1 puff Inhalation Q6H  . amiodarone  200 mg Oral Daily  . vitamin C  500 mg Oral Daily  . aspirin EC  81 mg Oral Daily  . atorvastatin  80 mg Oral QHS  . clopidogrel  75 mg Oral Daily  . enoxaparin (LOVENOX) injection  30 mg Subcutaneous Q24H  . feeding supplement  237 mL Oral TID BM  . furosemide  40 mg Intravenous Q12H  . guaiFENesin  600 mg Oral BID  . levothyroxine  100 mcg Oral QAC breakfast  . midodrine  5 mg Oral TID WC  . multivitamin with minerals  1 tablet Oral Daily  . pantoprazole  20 mg  Oral Daily  . predniSONE  50 mg Oral Daily  . sertraline  50 mg Oral Daily  . sodium chloride flush  3 mL Intravenous Q12H  . [START ON 10/18/2020] Vitamin D (Ergocalciferol)  50,000 Units Oral Q Wed  . zinc sulfate  220 mg Oral Daily   Continuous Infusions: . sodium chloride    . ceFEPime (MAXIPIME) IV 2 g (10/14/20 1422)  . remdesivir 100 mg in NS 100 mL 100 mg (10/14/20 0847)     LOS: 3 days   Marylu Lund, MD [facility] Pager On Amion  If 7PM-7AM, please contact night-coverage 10/14/2020, 4:37 PM

## 2020-10-14 NOTE — Progress Notes (Signed)
Daily Progress Note   Patient Name: Jenna West       Date: 10/14/2020 DOB: [birth year]  Age: 75 y.o. MRN#: [id number] Attending Physician: Donne Hazel, MD Primary Care Physician: Ronita Hipps, MD Admit Date: 10/11/2020  Reason for Follow-up: continued GOC discussion  Subjective: 11:44--Spoke with sister [name] by phone. She reports that patient has been tearful on the phone, having difficulty remembering why she is there. She also reports patient has not been eating very much while in the hospital or prior to admission. Discussed that this was often a sign of decline in the trajectory of chronic illness. [name] states the patient told her yesterday she felt she was "getting worse".   Discussed that patient was evaluated by PT yesterday (1/28) and they noted patient was feeling weak and limited in endurance. Also noted that patient was not sure what is happening to her, not recalling what she is able to do alone. Discussed that they are recommending home health with PT.   Discussed disposition options with [name]; home health with PT versus home with hospice. Emphasized that the focus of home health with PT is improvement or at least stabilization of functional status, where the focus of hospice is comfort and quality of life within the setting of end-stage illness. [name] shares that patient's husband is not "much of a caregiver", and she would like patient at least to be able to get from the bed to the [facility]. However, she expresses concern that the patient is in a trajectory of decline.   [name] indicates she is strongly considering home with hospice, but requests until Monday to make a decision.   16:45--Assessed patient at bedside. She reports feeling "fine". Oxygen appears to be off, although  nasal cannula remains on her face. Oxygen saturation is currently 91%. I provided her a written list of  family phone numbers per [name]'s request. Patient has difficulty following instructions to use the phone and dial the number.   Length of Stay: 3  Current Medications: Scheduled Meds:  . albuterol  1 puff Inhalation Q6H  . amiodarone  200 mg Oral Daily  . vitamin C  500 mg Oral Daily  . aspirin EC  81 mg Oral Daily  . atorvastatin  80 mg Oral QHS  . clopidogrel  75 mg Oral Daily  .  enoxaparin (LOVENOX) injection  30 mg Subcutaneous Q24H  . feeding supplement  237 mL Oral TID BM  . furosemide  40 mg Intravenous Q12H  . guaiFENesin  600 mg Oral BID  . levothyroxine  100 mcg Oral QAC breakfast  . midodrine  5 mg Oral TID WC  . multivitamin with minerals  1 tablet Oral Daily  . pantoprazole  20 mg Oral Daily  . predniSONE  50 mg Oral Daily  . sertraline  50 mg Oral Daily  . sodium chloride flush  3 mL Intravenous Q12H  . [START ON 10/18/2020] Vitamin D (Ergocalciferol)  50,000 Units Oral Q Wed  . zinc sulfate  220 mg Oral Daily    Continuous Infusions: . sodium chloride    . ceFEPime (MAXIPIME) IV 2 g (10/13/20 1517)  . magnesium sulfate bolus IVPB 4 g (10/14/20 0959)  . remdesivir 100 mg in NS 100 mL 100 mg (10/14/20 0847)    PRN Meds: sodium chloride, acetaminophen **OR** acetaminophen, chlorpheniramine-HYDROcodone, fentaNYL (SUBLIMAZE) injection, guaiFENesin-dextromethorphan, melatonin, ondansetron **OR** ondansetron (ZOFRAN) IV, senna-docusate, sodium chloride flush  Physical Exam Vitals reviewed.  Constitutional:      General: She is not in acute distress.    Comments: Chronically ill-appearing  Cardiovascular:     Rate and Rhythm: Normal rate.     Comments: Paced rhythm with BBB Pulmonary:     Effort: Pulmonary effort is normal.  Neurological:     Mental Status: She is alert.  Psychiatric:        Cognition and Memory: Memory is impaired.             Vital  Signs: BP (!) 102/56   Pulse 69   Temp 97.9 F (36.6 C) (Oral)   Resp 16   Ht 5\' 4"  (1.626 m)   Wt 67.1 kg   SpO2 98%   BMI 25.39 kg/m  SpO2: SpO2: 98 % O2 Device: O2 Device: Nasal Cannula O2 Flow Rate: O2 Flow Rate (L/min): 2 L/min  Intake/output summary:   Intake/Output Summary (Last 24 hours) at 10/14/2020 1156 Last data filed at 10/14/2020 0600 Gross per 24 hour  Intake 538.19 ml  Output 950 ml  Net -411.81 ml   LBM: Last BM Date: 10/11/20 Baseline Weight: Weight: 64 kg Most recent weight: Weight: 67.1 kg       Palliative Assessment/Data: 40%   Flowsheet Rows   Flowsheet Row Most Recent Value  Intake Tab   Referral Department Hospitalist  Unit at Time of Referral ER  Palliative Care Primary Diagnosis Cardiac  Date Notified 10/11/20  Palliative Care Type Return patient Palliative Care  Reason for referral Clarify Goals of Care  Date of Admission 10/11/20  Date first seen by Palliative Care 10/12/20  # of days Palliative referral response time 1 Day(s)  # of days IP prior to Palliative referral 0  Clinical Assessment   Psychosocial & Spiritual Assessment   Palliative Care Outcomes   Patient/Family meeting held? Yes  Who was at the meeting? patient, sister by phone  Palliative Care Outcomes Clarified goals of care, Counseled regarding hospice  Patient/Family wishes: Interventions discontinued/not started  Vasopressors, Mechanical Ventilation        Palliative Care Assessment & Plan   HPI/Patient Profile: 75 y.o. female  with past medical history of chronic systolic heart failure (EF 20%), CAD, hypertension, CKD stage IIIb, diabetes, seizure, and hypothyroidism. She was recently admitted 1/12 with for acute chronic heart failure exacerbation and rectal bleeding. She was determined not a  candidate for endoscopy or colonscopy. She was discharged home on 1/18, and set up with outpatient palliative care through [place]. Patient presented to the emergency  department on 10/11/2020 via EMS after she was found unresponsive in bed, not breathing normally per her husband. On EMS arrival oxygen saturation was 83% on room air. Patient received bag ventilation and subsequently became more alert.  ED Course: Transitioned to 5L oxygen. Found to be hypotensive 74/58. Covid PCR test positive. Labs significant for sodium 134, creatinine 2.6, BNP 2652, lactic acid 3, WBC 3.5, platelet 97, INR 1.3. Chest x-ray shows right pleural effusion and  increase in perihilar airspace disease suggesting flash pulmonary edema.  Patient admitted to [facility] for management of acute hypoxic respiratory failure secondary to Covid pneumonia, acute heart failure exacerbation, AKI on CKD IIIb, hypotension, and lactic acidosis.    Assessment: - acute hypoxic respiratory failure, COVID pneumonia - acute systolic heart failure exacerbation - VT - hypotension, lactic acidosis - leukopenia - AKI on CKD stage IIIb - generalized weakness  Recommendations/Plan:  DNR/DNI as previously documented  Family declined aggressive medical intervention including central line, vasopressors  Continue current medical care  Goal of care is to return home  Family is considering [place] with PT versus home with hospice - requests until Monday to make a decision  PMT will follow-up Monday  Goals of Care and Additional Recommendations:  No aggressive interventions, supportive care only  Code Status:  DNR/DNI  Prognosis:   < 6 months  Discharge Planning:  To Be Determined  Care plan was discussed with Dr. Wyline Copas  Thank you for allowing the Palliative Medicine Team to assist in the care of this patient.   Total Time 35 minutes Prolonged Time Billed  no       Greater than 50%  of this time was spent counseling and coordinating care related to the above assessment and plan.  Lavena Bullion, NP  Please contact Palliative Medicine Team phone at [phone] for questions and concerns.

## 2020-10-14 NOTE — Progress Notes (Signed)
Pt refusing lunch tray.

## 2020-10-14 NOTE — Progress Notes (Signed)
Pt ambulated to chair with minimal assistance sat up for 2hrs and returned back to bed with standby assistance. Ate jello for lunch and refused ensure. 02 1L 96%

## 2020-10-15 DIAGNOSIS — I5023 Acute on chronic systolic (congestive) heart failure: Secondary | ICD-10-CM | POA: Diagnosis not present

## 2020-10-15 DIAGNOSIS — J9602 Acute respiratory failure with hypercapnia: Secondary | ICD-10-CM | POA: Diagnosis not present

## 2020-10-15 LAB — CBC WITH DIFFERENTIAL/PLATELET
Abs Immature Granulocytes: 0.02 10*3/uL (ref 0.00–0.07)
Basophils Absolute: 0 10*3/uL (ref 0.0–0.1)
Basophils Relative: 0 %
Eosinophils Absolute: 0 10*3/uL (ref 0.0–0.5)
Eosinophils Relative: 0 %
HCT: 43.7 % (ref 36.0–46.0)
Hemoglobin: 13.3 g/dL (ref 12.0–15.0)
Immature Granulocytes: 0 %
Lymphocytes Relative: 4 %
Lymphs Abs: 0.3 10*3/uL — ABNORMAL LOW (ref 0.7–4.0)
MCH: 29.7 pg (ref 26.0–34.0)
MCHC: 30.4 g/dL (ref 30.0–36.0)
MCV: 97.5 fL (ref 80.0–100.0)
Monocytes Absolute: 0.2 10*3/uL (ref 0.1–1.0)
Monocytes Relative: 4 %
Neutro Abs: 5.8 10*3/uL (ref 1.7–7.7)
Neutrophils Relative %: 92 %
Platelets: 97 10*3/uL — ABNORMAL LOW (ref 150–400)
RBC: 4.48 MIL/uL (ref 3.87–5.11)
RDW: 18.8 % — ABNORMAL HIGH (ref 11.5–15.5)
WBC: 6.4 10*3/uL (ref 4.0–10.5)
nRBC: 0 % (ref 0.0–0.2)

## 2020-10-15 LAB — C-REACTIVE PROTEIN: CRP: 0.9 mg/dL (ref <1.0)

## 2020-10-15 LAB — COMPREHENSIVE METABOLIC PANEL
ALT: 14 U/L (ref 0–44)
AST: 31 U/L (ref 15–41)
Albumin: 2.7 g/dL — ABNORMAL LOW (ref 3.5–5.0)
Alkaline Phosphatase: 67 U/L (ref 38–126)
Anion gap: 14 (ref 5–15)
BUN: 43 mg/dL — ABNORMAL HIGH (ref 8–23)
CO2: 27 mmol/L (ref 22–32)
Calcium: 8.1 mg/dL — ABNORMAL LOW (ref 8.9–10.3)
Chloride: 93 mmol/L — ABNORMAL LOW (ref 98–111)
Creatinine, Ser: 2.44 mg/dL — ABNORMAL HIGH (ref 0.44–1.00)
GFR, Estimated: 20 mL/min — ABNORMAL LOW (ref >60)
Glucose, Bld: 289 mg/dL — ABNORMAL HIGH (ref 70–99)
Potassium: 4.4 mmol/L (ref 3.5–5.1)
Sodium: 134 mmol/L — ABNORMAL LOW (ref 135–145)
Total Bilirubin: 2.1 mg/dL — ABNORMAL HIGH (ref 0.3–1.2)
Total Protein: 5.7 g/dL — ABNORMAL LOW (ref 6.5–8.1)

## 2020-10-15 LAB — MAGNESIUM: Magnesium: 2.5 mg/dL — ABNORMAL HIGH (ref 1.7–2.4)

## 2020-10-15 LAB — T4, FREE: Free T4: 0.93 ng/dL (ref 0.61–1.12)

## 2020-10-15 LAB — D-DIMER, QUANTITATIVE: D-Dimer, Quant: 2.55 ug/mL-FEU — ABNORMAL HIGH (ref 0.00–0.50)

## 2020-10-15 NOTE — Progress Notes (Signed)
Pharmacy Antibiotic Note  Jenna West is a 75 y.o. female admitted on 10/11/2020 with sepsis.  Pharmacy has been consulted for vancomycin and cefepime dosing.  Scr 2.4, stable, recent baseline unclear. Current estimated CrCl ~19 ml/min. WBC normal, afebrile.  Plan: Cefepime 2g q24h  Monitor renal function, cultures, labs, clinical progression  Height: 5\' 4"  (162.6 cm) Weight: 67.1 kg (147 lb 14.9 oz) IBW/kg (Calculated) : 54.7  Temp (24hrs), Avg:98.3 F (36.8 C), Min:98 F (36.7 C), Max:98.7 F (37.1 C)  Recent Labs  Lab 10/11/20 1428 10/11/20 1728 10/12/20 0407 10/13/20 0456 10/14/20 0348 10/15/20 0534  WBC 3.5*  --  1.8* 5.7 7.6 6.4  CREATININE 2.65*  --  2.51* 2.60* 2.52* 2.44*  LATICACIDVEN 3.0* 1.9  --   --   --   --     Estimated Creatinine Clearance: 19.1 mL/min (A) (by C-G formula based on SCr of 2.44 mg/dL (H)).    Antimicrobials this admission: Cefepime 1/26 >>  Remdesivir 1/26 >> 1/30 Flagyl 1/26 x1   Microbiology results: 1/26 BCx: NG4D 1/26 UCx: No growth 1/26 PCR: covid positive  Thank you for allowing pharmacy to be a part of this patient's care.  Norina Buzzard, PharmD PGY1 Pharmacy Resident 10/15/2020 10:11 AM Please see AMION for all pharmacy numbers

## 2020-10-15 NOTE — Progress Notes (Signed)
PROGRESS NOTE    Jenna West  HDQ:[id number] DOB: Aug 27, 1946 DOA: 10/11/2020 PCP: Ronita Hipps, MD    Brief Narrative:  75 y.o. female hypothyroidism, chronic systolic heart failure Ef 20 %, CAD, hypertension, seizure, CKD stage IIIb, diabetes, IBS recent admission 1/18 for acute on chronic systolic heart failure exacerbation and rectal bleeding.  During last admission palliative care was recommended for patient due to her end-stage heart failure,  CKD and dementia. She was thought not to be a candidate for endoscopy colonoscopy during last admission.  Patient presented via EMS, patient was found in bed unresponsive, not breathing normally per  husband.  EMS was called.  On EMS arrival oxygen saturation was 83 on room air.  Patient received ventilation and subsequently she became more alert.  Was placed on nonrebreather mask.  And subsequently transitioned to 5 L in the ED.  Evaluation in the ED; patient was found to be hypotensive systolic blood pressure 11/94.  She received IV bolus which improved her blood pressure 100/74.  She was placed on 5 L of oxygen. Covid PCR test came back positive.  Chest x-ray; increase in perihilar airspace disease suggests flash pulmonary edema, right pleural effusion.  Sodium 134, creatinine 2.6, BNP 2652, lactic acid 3.0, white blood cell 3.5, hemoglobin 13, platelet 97, INR 1.3..  Assessment & Plan:   Active Problems:   Acquired hypothyroidism   Cardiomyopathy ([place])   ICD (implantable cardioverter-defibrillator) in place   Type 2 diabetes mellitus with stage 3 chronic kidney disease, with long-term current use of insulin (HCC)   Acute on chronic systolic CHF (congestive heart failure) (HCC)   Seizure (HCC)   Pneumonia due to COVID-19 virus   Acute hypercapnic respiratory failure ([place])   1-Acute hypoxic respiratory Failure; Covid 19 PNA Initially presented hypoxemic, requiring 5 L oxygen.  Chest x ray with pulmonary edema and pleural effusion.   COVID pos Completed course of Remdesivir and IV steroids.  Procal is neg. CRP is down to 0.9, Currently on 2.5LNC, cont to wean O2. Pt is O2 naive at baseline Continue guaifenesin and Albuterol inhaler.   2-Acute systolic Heart failure exacerbation;  Continue with IV lasix, as BP allows.  Pt was started on midodrine to help with BP.  Dr. Tyrell Antonio discussed with sister, no Central line, picc line or IV pressors.  Palliative care consulted and continues to follow Cont to hold entresto due to AKI and hypotension. Continuing to hold coreg for now  3-VT; continue with amiodarone as pt tolerates   4-Hypotension, lactic acidosis;  Suspect related to covid and or cardiogenic shock.  No IV pressors per family  Continued on Midodrine.  Was continued with Cefepime. Given neg procalcitonin, will hold further abx Blood cultures neg  5-Leukopenia, thrombocytopenia. Related to viral illness. Cont to follow cbc trends   6-AKI on CKD stage IIIb; Prior Cr 1.8--2.1 Presents with cr at 2.6.  Suspect cardio renal syndrome plus hypotension.  Continues to tolerate IV lasix Repeat bmet in AM  7-Depression -Pt reports feeling overall depressed -Denies suicidal or homicidal ideations -Have ordered and reviewed EKG. QTc is prolonged at over 570, thus will hold off on SSRI  DVT prophylaxis: Lovenox subq Code Status: DNR Family Communication: Pt in room, family not at bedside  Status is: Inpatient  Remains inpatient appropriate because:IV treatments appropriate due to intensity of illness or inability to take PO and Inpatient level of care appropriate due to severity of illness   Dispo: The patient is from: Home  Anticipated d/c is to: Home              Anticipated d/c date is: 3 days              Patient currently is not medically stable to d/c.   Difficult to place patient No   Consultants:   Palliative Care  Procedures:     Antimicrobials: Anti-infectives (From  admission, onward)   Start     Dose/Rate Route Frequency Ordered Stop   10/12/20 1500  ceFEPIme (MAXIPIME) 2 g in sodium chloride 0.9 % 100 mL IVPB        2 g 200 mL/hr over 30 Minutes Intravenous Every 24 hours 10/11/20 1638     10/12/20 1000  remdesivir 100 mg in sodium chloride 0.9 % 100 mL IVPB       "Followed by" Linked Group Details   100 mg 200 mL/hr over 30 Minutes Intravenous Daily 10/11/20 1631 10/15/20 1127   10/11/20 1645  remdesivir 200 mg in sodium chloride 0.9% 250 mL IVPB       "Followed by" Linked Group Details   200 mg 580 mL/hr over 30 Minutes Intravenous Once 10/11/20 1631 10/11/20 1845   10/11/20 1637  vancomycin variable dose per unstable renal function (pharmacist dosing)  Status:  Discontinued         Does not apply See admin instructions 10/11/20 1638 10/11/20 1722   10/11/20 1445  vancomycin (VANCOREADY) IVPB 1250 mg/250 mL  Status:  Discontinued        1,250 mg 166.7 mL/hr over 90 Minutes Intravenous  Once 10/11/20 1432 10/11/20 1722   10/11/20 1430  ceFEPIme (MAXIPIME) 2 g in sodium chloride 0.9 % 100 mL IVPB        2 g 200 mL/hr over 30 Minutes Intravenous  Once 10/11/20 1416 10/11/20 1517   10/11/20 1430  metroNIDAZOLE (FLAGYL) IVPB 500 mg        500 mg 100 mL/hr over 60 Minutes Intravenous  Once 10/11/20 1416 10/11/20 1556   10/11/20 1430  vancomycin (VANCOCIN) IVPB 1000 mg/200 mL premix  Status:  Discontinued        1,000 mg 200 mL/hr over 60 Minutes Intravenous  Once 10/11/20 1416 10/11/20 1432      Subjective: Feeling depressed. Denies suicidal or homicidal ideation  Objective: Vitals:   10/15/20 0900 10/15/20 1100 10/15/20 1500 10/15/20 1600  BP: (!) 102/51 (!) 101/55 (!) 108/54 (!) 107/55  Pulse: 70 70 69 69  Resp: 18 16 19 14   Temp:  98.2 F (36.8 C)  98.2 F (36.8 C)  TempSrc:  Oral  Oral  SpO2: 94% 99% 96% 100%  Weight:      Height:        Intake/Output Summary (Last 24 hours) at 10/15/2020 1646 Last data filed at 10/15/2020  0800 Gross per 24 hour  Intake -  Output 1600 ml  Net -1600 ml   Filed Weights   10/11/20 1408 10/12/20 1602 10/13/20 0500  Weight: 64 kg 66.2 kg 67.1 kg    Examination: General exam: Conversant, in no acute distress Respiratory system: normal chest rise, clear, no audible wheezing Cardiovascular system: regular rhythm, s1-s2 Gastrointestinal system: Nondistended, nontender, pos BS Central nervous system: No seizures, no tremors Extremities: No cyanosis, no joint deformities Skin: No rashes, no pallor Psychiatry: Depressed // no auditory hallucinations   Data Reviewed: I have personally reviewed following labs and imaging studies  CBC: Recent Labs  Lab 10/11/20 1428 10/12/20 0407 10/13/20 0456  10/14/20 0348 10/15/20 0534  WBC 3.5* 1.8* 5.7 7.6 6.4  NEUTROABS 2.9 1.6* 5.1 6.6 5.8  HGB 13.1 13.3 13.6 12.8 13.3  HCT 43.2 41.3 43.1 38.6 43.7  MCV 99.1 95.8 95.8 93.0 97.5  PLT 97* 79* 129* 121* 97*   Basic Metabolic Panel: Recent Labs  Lab 10/11/20 1428 10/12/20 0407 10/13/20 0456 10/14/20 0348 10/15/20 0534  NA 134* 136 138 138 134*  K 4.1 4.2 4.0 3.9 4.4  CL 97* 98 97* 96* 93*  CO2 23 24 27 30 27   GLUCOSE 199* 174* 177* 189* 289*  BUN 32* 33* 36* 41* 43*  CREATININE 2.65* 2.51* 2.60* 2.52* 2.44*  CALCIUM 7.8* 7.9* 8.0* 8.2* 8.1*  MG  --  1.7 1.7 1.6* 2.5*   GFR: Estimated Creatinine Clearance: 19.1 mL/min (A) (by C-G formula based on SCr of 2.44 mg/dL (H)). Liver Function Tests: Recent Labs  Lab 10/11/20 1428 10/12/20 0407 10/13/20 0456 10/14/20 0348 10/15/20 0534  AST 27 20 19 20 31   ALT 16 12 12 12 14   ALKPHOS 73 60 58 58 67  BILITOT 2.1* 2.2* 2.0* 1.7* 2.1*  PROT 5.4* 5.4* 5.6* 5.4* 5.7*  ALBUMIN 2.7* 2.6* 2.7* 2.6* 2.7*   No results for input(s): LIPASE, AMYLASE in the last 168 hours. No results for input(s): AMMONIA in the last 168 hours. Coagulation Profile: Recent Labs  Lab 10/11/20 1535  INR 1.3*   Cardiac Enzymes: No results for  input(s): CKTOTAL, CKMB, CKMBINDEX, TROPONINI in the last 168 hours. BNP (last 3 results) No results for input(s): PROBNP in the last 8760 hours. HbA1C: No results for input(s): HGBA1C in the last 72 hours. CBG: Recent Labs  Lab 10/11/20 1426  GLUCAP 187*   Lipid Profile: No results for input(s): CHOL, HDL, LDLCALC, TRIG, CHOLHDL, LDLDIRECT in the last 72 hours. Thyroid Function Tests: Recent Labs    10/15/20 0534  FREET4 0.93   Anemia Panel: No results for input(s): VITAMINB12, FOLATE, FERRITIN, TIBC, IRON, RETICCTPCT in the last 72 hours. Sepsis Labs: Recent Labs  Lab 10/11/20 1428 10/11/20 1726 10/11/20 1728 10/12/20 0407 10/13/20 0456  PROCALCITON  --  0.18  --  0.21 0.20  LATICACIDVEN 3.0*  --  1.9  --   --     Recent Results (from the past 240 hour(s))  Urine culture     Status: None   Collection Time: 10/11/20  5:15 AM   Specimen: In/Out Cath Urine  Result Value Ref Range Status   Specimen Description IN/OUT CATH URINE  Final   Special Requests NONE  Final   Culture   Final    NO GROWTH Performed at [facility], [place] [address], [place], [place] [address]    Report Status 10/13/2020 FINAL  Final  Blood Culture (routine x 2)     Status: None (Preliminary result)   Collection Time: 10/11/20  2:28 PM   Specimen: BLOOD  Result Value Ref Range Status   Specimen Description BLOOD RIGHT ANTECUBITAL  Final   Special Requests   Final    BOTTLES DRAWN AEROBIC AND ANAEROBIC Blood Culture results may not be optimal due to an inadequate volume of blood received in culture bottles   Culture   Final    NO GROWTH 4 DAYS Performed at [facility], [place] [address], [place],  [address]    Report Status PENDING  Incomplete  Blood Culture (routine x 2)     Status: None (Preliminary result)   Collection Time:  10/11/20  2:39 PM   Specimen: BLOOD  Result Value Ref Range Status   Specimen Description BLOOD SITE NOT SPECIFIED  Final   Special Requests    Final    BOTTLES DRAWN AEROBIC AND ANAEROBIC Blood Culture results may not be optimal due to an inadequate volume of blood received in culture bottles   Culture   Final    NO GROWTH 4 DAYS Performed at [facility], [place] [address], [place], [place] [address]    Report Status PENDING  Incomplete  SARS Coronavirus 2 by RT PCR (hospital order, performed in [facility] hospital lab) Nasopharyngeal Nasopharyngeal Swab     Status: Abnormal   Collection Time: 10/11/20  2:52 PM   Specimen: Nasopharyngeal Swab  Result Value Ref Range Status   SARS Coronavirus 2 POSITIVE (A) NEGATIVE Final    Comment: emailed L. Berdik RN 16:00 10/11/20 ([email or web address]) (NOTE) SARS-CoV-2 target nucleic acids are DETECTED  SARS-CoV-2 RNA is generally detectable in upper respiratory specimens  during the acute phase of infection.  Positive results are indicative  of the presence of the identified virus, but do not rule out bacterial infection or co-infection with other pathogens not detected by the test.  Clinical correlation with patient history and  other diagnostic information is necessary to determine patient infection status.  The expected result is negative.  Fact Sheet for Patients:   StrictlyIdeas.no   Fact Sheet for Healthcare Providers:   BankingDealers.co.za    This test is not yet approved or cleared by the [place] FDA and  has been authorized for detection and/or diagnosis of SARS-CoV-2 by FDA under an Emergency Use Authorization (EUA).  This EUA will remain in effect (meaning this test can be used) for the duration of  the  COVID-19 declaration under Section 564(b)(1) of the Act, 21 U.S.C. section 360-bbb-3(b)(1), unless the authorization is terminated or revoked sooner.  Performed at [facility], [place] [address], [place],  [address]      Radiology Studies: No results found.  Scheduled Meds: . albuterol  1 puff Inhalation  Q6H  . amiodarone  200 mg Oral Daily  . vitamin C  500 mg Oral Daily  . aspirin EC  81 mg Oral Daily  . atorvastatin  80 mg Oral QHS  . clopidogrel  75 mg Oral Daily  . enoxaparin (LOVENOX) injection  30 mg Subcutaneous Q24H  . feeding supplement  237 mL Oral TID BM  . furosemide  40 mg Intravenous Q12H  . guaiFENesin  600 mg Oral BID  . levothyroxine  100 mcg Oral QAC breakfast  . midodrine  5 mg Oral TID WC  . multivitamin with minerals  1 tablet Oral Daily  . pantoprazole  20 mg Oral Daily  . predniSONE  50 mg Oral Daily  . sertraline  50 mg Oral Daily  . sodium chloride flush  3 mL Intravenous Q12H  . [START ON 10/18/2020] Vitamin D (Ergocalciferol)  50,000 Units Oral Q Wed  . zinc sulfate  220 mg Oral Daily   Continuous Infusions: . sodium chloride    . ceFEPime (MAXIPIME) IV 2 g (10/15/20 1443)     LOS: 4 days   Marylu Lund, MD [facility] Pager On Amion  If 7PM-7AM, please contact night-coverage 10/15/2020, 4:46 PM

## 2020-10-15 NOTE — TOC Initial Note (Signed)
Transition of Care [facility]) - Initial/Assessment Note    Patient Details  Name: Jenna West MRN: [id number] Date of Birth: 03-02-1946  Transition of Care [facility]) CM/SW Contact:    Maebelle Munroe, RN Phone Number: 10/15/2020, 11:23 AM  Clinical Narrative: [facility] team spoke to sister- Jenna West regarding home with hospice for discharge. She is agreeable with the plan. She shares that patient lives with spouse that is not able to care for patient on a continuous basis. There is a housekeeper that comes intermittently. She also shares that she is looking for personal care services to supplement what hospice will provide.  Contacted Frankie at [place] to place referral. Will continue with discharge planning.                 Expected Discharge Plan: Home w Hospice Care Barriers to Discharge: No Barriers Identified   Patient Goals and CMS Choice Patient states their goals for this hospitalization and ongoing recovery are:: Return home with hospice care and supplemental personal care services.      Expected Discharge Plan and Services Expected Discharge Plan: [place] In-house Referral: Clinical Social Work Discharge Planning Services: CM Consult Post Acute Care Choice: Hospice Living arrangements for the past 2 months: [place]: RN [facility] Agency: [place] Date [place]: 10/15/20 Time [place]: 107 Representative spoke with at [facility]: Tharon [phone]  Prior Living Arrangements/Services Living arrangements for the past 2 months: [place] with:: Self,Spouse Patient language and need for interpreter reviewed:: No Do you feel safe going back to the place where you live?: Yes      Need for Family Participation in Patient Care: Yes (Comment) (sisterIzora West: [phone]) Care giver support system in place?: Yes (comment) (spouse lives in home; sister-  Jenna West lives close by.) Current home services: Housekeeping Criminal Activity/Legal Involvement Pertinent to Current Situation/Hospitalization: No - Comment as needed  Activities of Daily Living      Permission Sought/Granted Permission sought to share information with : Family Supports Permission granted to share information with : Yes, Verbal Permission Granted  Share Information with NAME: [place] [facility] granted to share info w AGENCY: [facility]        Emotional Assessment           Psych Involvement: No (comment)  Admission diagnosis:  Acute pulmonary edema ([place]) [J81.0] Lactic acid acidosis [E87.2] AKI (acute kidney injury) ([place]) [N17.9] Acute hypoxemic respiratory failure ([place]) [J96.01] COVID [U07.1] Pneumonia due to COVID-19 virus [U07.1, J12.82] Patient Active Problem List   Diagnosis Date Noted  . Pneumonia due to COVID-19 virus 10/11/2020  . Acute hypercapnic respiratory failure ([place]) 10/11/2020  . Diarrhea, unspecified 06/18/2020  . Seizure ([place]) 06/18/2020  . Ischemic cardiomyopathy 06/04/2017  . Heartburn 05/29/2017  . Acute on chronic systolic CHF (congestive heart failure) ([place]) 05/28/2017  . 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 Stage 3 chronic kidney disease ([place]) 12/18/2016  . On amiodarone therapy 08/15/2016  . Type 2 diabetes mellitus with stage 3 chronic kidney disease, with long-term current use of insulin ([place]) 07/17/2016  . Aspirin long-term use 03/07/2016  . Bilateral carotid artery stenosis 03/07/2016  . Angina pectoris ([place]) 07/13/2015  . Carotid  artery stenosis 06/21/2015  . Acquired hypothyroidism 05/01/2015  . 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 Cardiomyopathy ([place]) 05/01/2015  . Coronary artery disease involving native coronary artery of native heart with  angina pectoris ([place]) 05/01/2015  . Coronary artery disease of native artery of native heart with stable angina pectoris ([place]) 05/01/2015  . ICD (implantable cardioverter-defibrillator) in place 05/01/2015  . Left heart failure ([place]) 05/01/2015  . MI (myocardial infarction) ([place]) 05/01/2015  . Pure hypercholesterolemia 05/01/2015  . Syncope and collapse 05/01/2015  . VT (ventricular tachycardia) ([place]) 05/01/2015  . Coronary arteriosclerosis in native artery 05/01/2015   PCP:  Ronita Hipps, MD Pharmacy:   [place], [place], [address] [place], [address] [place] [address] Phone: [phone] Fax: [phone]  [place] [address], [place] [address] Phone: [phone] Fax: [place], [place] - [place] [address] [place] [place] [address] Phone: [phone] Fax: [phone]     Social Determinants of Health (SDOH) Interventions    Readmission Risk Interventions No flowsheet data found.

## 2020-10-16 DIAGNOSIS — J1282 Pneumonia due to coronavirus disease 2019: Secondary | ICD-10-CM

## 2020-10-16 DIAGNOSIS — R569 Unspecified convulsions: Secondary | ICD-10-CM

## 2020-10-16 DIAGNOSIS — Z66 Do not resuscitate: Secondary | ICD-10-CM

## 2020-10-16 DIAGNOSIS — J9601 Acute respiratory failure with hypoxia: Secondary | ICD-10-CM | POA: Diagnosis not present

## 2020-10-16 DIAGNOSIS — Z95810 Presence of automatic (implantable) cardiac defibrillator: Secondary | ICD-10-CM

## 2020-10-16 DIAGNOSIS — Z789 Other specified health status: Secondary | ICD-10-CM

## 2020-10-16 DIAGNOSIS — J9602 Acute respiratory failure with hypercapnia: Secondary | ICD-10-CM | POA: Diagnosis not present

## 2020-10-16 LAB — COMPREHENSIVE METABOLIC PANEL
ALT: 21 U/L (ref 0–44)
AST: 76 U/L — ABNORMAL HIGH (ref 15–41)
Albumin: 2.7 g/dL — ABNORMAL LOW (ref 3.5–5.0)
Alkaline Phosphatase: 71 U/L (ref 38–126)
Anion gap: 13 (ref 5–15)
BUN: 43 mg/dL — ABNORMAL HIGH (ref 8–23)
CO2: 30 mmol/L (ref 22–32)
Calcium: 8.1 mg/dL — ABNORMAL LOW (ref 8.9–10.3)
Chloride: 91 mmol/L — ABNORMAL LOW (ref 98–111)
Creatinine, Ser: 2.29 mg/dL — ABNORMAL HIGH (ref 0.44–1.00)
GFR, Estimated: 22 mL/min — ABNORMAL LOW (ref >60)
Glucose, Bld: 303 mg/dL — ABNORMAL HIGH (ref 70–99)
Potassium: 5.2 mmol/L — ABNORMAL HIGH (ref 3.5–5.1)
Sodium: 134 mmol/L — ABNORMAL LOW (ref 135–145)
Total Bilirubin: 2.1 mg/dL — ABNORMAL HIGH (ref 0.3–1.2)
Total Protein: 5.7 g/dL — ABNORMAL LOW (ref 6.5–8.1)

## 2020-10-16 LAB — CBC WITH DIFFERENTIAL/PLATELET
Abs Immature Granulocytes: 0.03 10*3/uL (ref 0.00–0.07)
Basophils Absolute: 0 10*3/uL (ref 0.0–0.1)
Basophils Relative: 0 %
Eosinophils Absolute: 0 10*3/uL (ref 0.0–0.5)
Eosinophils Relative: 0 %
HCT: 39.2 % (ref 36.0–46.0)
Hemoglobin: 13 g/dL (ref 12.0–15.0)
Immature Granulocytes: 1 %
Lymphocytes Relative: 4 %
Lymphs Abs: 0.3 10*3/uL — ABNORMAL LOW (ref 0.7–4.0)
MCH: 30.7 pg (ref 26.0–34.0)
MCHC: 33.2 g/dL (ref 30.0–36.0)
MCV: 92.5 fL (ref 80.0–100.0)
Monocytes Absolute: 0.3 10*3/uL (ref 0.1–1.0)
Monocytes Relative: 4 %
Neutro Abs: 5.7 10*3/uL (ref 1.7–7.7)
Neutrophils Relative %: 91 %
Platelets: 88 10*3/uL — ABNORMAL LOW (ref 150–400)
RBC: 4.24 MIL/uL (ref 3.87–5.11)
RDW: 19 % — ABNORMAL HIGH (ref 11.5–15.5)
WBC: 6.3 10*3/uL (ref 4.0–10.5)
nRBC: 0 % (ref 0.0–0.2)

## 2020-10-16 LAB — CULTURE, BLOOD (ROUTINE X 2)
Culture: NO GROWTH
Culture: NO GROWTH

## 2020-10-16 LAB — MAGNESIUM: Magnesium: 2.5 mg/dL — ABNORMAL HIGH (ref 1.7–2.4)

## 2020-10-16 LAB — D-DIMER, QUANTITATIVE: D-Dimer, Quant: 2.13 ug/mL-FEU — ABNORMAL HIGH (ref 0.00–0.50)

## 2020-10-16 LAB — C-REACTIVE PROTEIN: CRP: 0.7 mg/dL (ref <1.0)

## 2020-10-16 NOTE — Progress Notes (Signed)
   Spoke to the pt's sister [name]. She confirms pt is a DNR and that she will go back to her home upon d/c from hospital to stay with her husband. She also confirms that they are interested in Hospice services at home. I do notice that pt has AICD according to the chart. I have asked PC to discuss with the pt and having this deactivated prior to the pt going home in hospital if the family is in agreement.   She has been approved for hospicie care at home.   She has equipment in home of oxygen and BSC. The sister is requesting a shower chair which will be ordered after pt gets home in event they recommend any other equipment needs while in home.   Webb Silversmith RN [phone]

## 2020-10-16 NOTE — Progress Notes (Signed)
Daily Progress Note   Patient Name: Jenna West       Date: 10/16/2020 DOB: 06-May-1946  Age: 75 y.o. MRN#: [id number] Attending Physician: Donne Hazel, MD Primary Care Physician: Ronita Hipps, MD Admit Date: 10/11/2020  Reason for Consultation/Follow-up: Disposition and Establishing goals of care  Subjective: Chart review performed. Received report from primary RN - no acute concerns. RN reports patient remains confused.   Called patient's sister/Jenna West to continue [place] discussion. Gently reviewed thoughts and feelings around her conversation with Jenna West/PMT the other day. Jenna West states she has decided she would like the patient to discharge home with hospice. Home hospice information was reviewed per her request. She expressed concern about being able to provide the level of care the patient would need at home - stated I would have our LCSW reach out with information for home health aids if that was something she was interested in - she was agreeable and appreciative. Discharge planning was discussed - I explained we would be able to get the patient home as soon as DME was delivered and when she felt she was ready to have the patient transferred home. Jenna West is hopeful for the patient to discharge either Tuesday or Wednesday depending on DME delivery. Jenna West would like to continue current medical treatment while in house.  I discussed turning off the patient's AICD before she discharged - Jenna West states that she wants to be able to speak with the patient personally and in person about this before making final decisions.   All questions and concerns addressed. Encouraged to call with questions and/or concerns. PMT number previously provided.  Notified and discussed with hospice liaison that family  was not agreeable to turn off AICD before discharge.     Length of Stay: 5  Current Medications: Scheduled Meds:  . albuterol  1 puff Inhalation Q6H  . amiodarone  200 mg Oral Daily  . vitamin C  500 mg Oral Daily  . aspirin EC  81 mg Oral Daily  . atorvastatin  80 mg Oral QHS  . clopidogrel  75 mg Oral Daily  . feeding supplement  237 mL Oral TID BM  . furosemide  40 mg Intravenous Q12H  . guaiFENesin  600 mg Oral BID  . levothyroxine  100 mcg Oral QAC breakfast  . midodrine  5 mg Oral TID WC  . multivitamin with minerals  1 tablet Oral Daily  . pantoprazole  20 mg Oral Daily  . predniSONE  50 mg Oral Daily  . sertraline  50 mg Oral Daily  . sodium chloride flush  3 mL Intravenous Q12H  . [START ON 10/18/2020] Vitamin D (Ergocalciferol)  50,000 Units Oral Q Wed  . zinc sulfate  220 mg Oral Daily    Continuous Infusions: . sodium chloride      PRN Meds: sodium chloride, acetaminophen **OR** acetaminophen, chlorpheniramine-HYDROcodone, fentaNYL (SUBLIMAZE) injection, guaiFENesin-dextromethorphan, melatonin, ondansetron **OR** ondansetron (ZOFRAN) IV, senna-docusate, sodium chloride flush           Vital Signs: BP (!) 115/48 (BP Location: Right Arm)   Pulse 61   Temp (!) 97.5 F (36.4 C) (Oral)   Resp 17   Ht 5\' 4"  (1.626 m)   Wt 61 kg   SpO2 95%   BMI 23.08 kg/m  SpO2: SpO2: 95 % O2 Device: O2 Device: Nasal Cannula O2 Flow Rate: O2 Flow Rate (L/min): 0.5 L/min  Intake/output summary:   Intake/Output Summary (Last 24 hours) at 10/16/2020 1524 Last data filed at 10/16/2020 1300 Gross per 24 hour  Intake 700 ml  Output 1800 ml  Net -1100 ml   LBM: Last BM Date: 10/11/20 Baseline Weight: Weight: 64 kg Most recent weight: Weight: 61 kg       Palliative Assessment/Data: 40%    Flowsheet Rows   Flowsheet Row Most Recent Value  Intake Tab   Referral Department Hospitalist  Unit at Time of Referral ER  Palliative Care Primary Diagnosis Cardiac  Date  Notified 10/11/20  Palliative Care Type Return patient Palliative Care  Reason for referral Clarify Goals of Care  Date of Admission 10/11/20  Date first seen by Palliative Care 10/12/20  # of days Palliative referral response time 1 Day(s)  # of days IP prior to Palliative referral 0  Clinical Assessment   Psychosocial & Spiritual Assessment   Palliative Care Outcomes   Patient/Family meeting held? Yes  Who was at the meeting? patient, sister by phone  Palliative Care Outcomes Clarified goals of care, Counseled regarding hospice  Patient/Family wishes: Interventions discontinued/not started  Vasopressors, Mechanical Ventilation      Patient Active Problem List   Diagnosis Date Noted  . Pneumonia due to COVID-19 virus 10/11/2020  . Acute hypercapnic respiratory failure ([place]) 10/11/2020  . Diarrhea, unspecified 06/18/2020  . Seizure ([place]) 06/18/2020  . Ischemic cardiomyopathy 06/04/2017  . Heartburn 05/29/2017  . Acute on chronic systolic CHF (congestive heart failure) ([place]) 05/28/2017  . 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 Stage 3 chronic kidney disease ([place]) 12/18/2016  . On amiodarone therapy 08/15/2016  . Type 2 diabetes mellitus with stage 3 chronic kidney disease, with long-term current use of insulin ([place]) 07/17/2016  . Aspirin long-term use 03/07/2016  . Bilateral carotid artery stenosis 03/07/2016  . Angina pectoris ([place]) 07/13/2015  . Carotid artery stenosis 06/21/2015  . Acquired hypothyroidism 05/01/2015  . 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 Cardiomyopathy ([place]) 05/01/2015  . Coronary artery disease involving native coronary artery of native heart with angina pectoris ([place]) 05/01/2015  . Coronary artery disease of native artery of native heart with stable angina pectoris ([place]) 05/01/2015  . ICD (implantable  cardioverter-defibrillator) in place 05/01/2015  . Left heart failure ([place]) 05/01/2015  .  MI (myocardial infarction) ([place]) 05/01/2015  . Pure hypercholesterolemia 05/01/2015  . Syncope and collapse 05/01/2015  . VT (ventricular tachycardia) ([place]) 05/01/2015  . Coronary arteriosclerosis in native artery 05/01/2015    Palliative Care Assessment & Plan   Patient Profile: 75 y.o.femalewith past medical history of chronic systolic heart failure (EF 20%), CAD, hypertension, CKD stage IIIb, diabetes, seizure, and hypothyroidism. She was recently admitted 1/12 with for acute chronic heart failure exacerbation and rectal bleeding. She was determined not a candidate for endoscopy or colonscopy. She was discharged home on 1/18, and set up with outpatient palliative care through [facility]. Patient presented to the emergency departmenton 1/26/2022via EMS after she was found unresponsive in bed, not breathing normally per her husband.On EMS arrival oxygen saturation was 83% on room air. Patient received bag ventilation and subsequently became more alert.  ED Course: Transitioned to 5L oxygen. Found to be hypotensive 74/58. Covid PCR test positive. Labs significant for sodium 134, creatinine 2.6, BNP 2652, lactic acid 3, WBC 3.5, platelet 97, INR 1.3. Chest x-ray shows right pleural effusion and increase in perihilar airspace disease suggesting flash pulmonary edema.  Patient admitted to [facility] for management of acute hypoxic respiratory failure secondary to Covid pneumonia, acute heart failure exacerbation, AKI on CKD IIIb, hypotension, and lactic acidosis.  Assessment: Acute hypoxic respiratory failure COVID19 pneumonia Acute systolic heart failure exacerbation VT Hypotension Leukopenia AKI on CKD stage 3b  Recommendations/Plan:  Continue current medical treatment without escalation of care - medically optimize for discharge  Continue DNR/DNI as previously documented  Family would like  patient discharge home with hospice - will be ready for discharge once all DME delivered (sister hopeful for discharge Tuesday or Wednesday)  TOC notified and consulted for: home hospice referral and family request for home health aid information  Sister wants AICD to remain on at this time  PMT will continue to follow peripherally. If there are any imminent needs please call the service directly  Goals of Care and Additional Recommendations:  Limitations on Scope of Treatment: Full Scope Treatment, No Artificial Feeding, No Hemodialysis, No Surgical Procedures and No Tracheostomy  Code Status:    Code Status Orders  (From admission, onward)         Start     Ordered   10/11/20 1801  Do not attempt resuscitation (DNR)  Continuous       Question Answer Comment  In the event of cardiac or respiratory ARREST Do not call a "code blue"   In the event of cardiac or respiratory ARREST Do not perform Intubation, CPR, defibrillation or ACLS   In the event of cardiac or respiratory ARREST Use medication by any route, position, wound care, and other measures to relive pain and suffering. May use oxygen, suction and manual treatment of airway obstruction as needed for comfort.      10/11/20 1800        Code Status History    Date Active Date Inactive Code Status Order ID Comments User Context   10/11/2020 1759 10/11/2020 1801 DNR [id number]  Elmarie Shiley, MD ED   09/28/2020 0052 10/03/2020 1907 Partial Code [id number]  Marcelyn Bruins, MD ED   09/28/2020 0003 09/28/2020 0052 Full Code [id number]  Marcelyn Bruins, MD ED   06/18/2020 1628 06/20/2020 2208 Full Code [id number]  Norins, Heinz Knuckles, MD ED   Advance Care Planning Activity       Prognosis:   < 6 months  Discharge Planning:  Home with Hospice  Care plan was discussed with primary RN, attending, TOC, hospice liaison, patient's sister  Thank you for allowing the Palliative Medicine Team to assist in the care of this  patient.   Total Time 38 minutes Prolonged Time Billed  no       Greater than 50%  of this time was spent counseling and coordinating care related to the above assessment and plan.  Lin Landsman, NP  Please contact Palliative Medicine Team phone at [phone] for questions and concerns.

## 2020-10-16 NOTE — Progress Notes (Signed)
PROGRESS NOTE    Jenna West  TKZ:[id number] DOB: [birth year] DOA: 10/11/2020 PCP: Ronita Hipps, MD    Brief Narrative:  75 y.o. female hypothyroidism, chronic systolic heart failure Ef 20 %, CAD, hypertension, seizure, CKD stage IIIb, diabetes, IBS recent admission 1/18 for acute on chronic systolic heart failure exacerbation and rectal bleeding.  During last admission palliative care was recommended for patient due to her end-stage heart failure,  CKD and dementia. She was thought not to be a candidate for endoscopy colonoscopy during last admission.  Patient presented via EMS, patient was found in bed unresponsive, not breathing normally per  husband.  EMS was called.  On EMS arrival oxygen saturation was 83 on room air.  Patient received ventilation and subsequently she became more alert.  Was placed on nonrebreather mask.  And subsequently transitioned to 5 L in the ED.  Evaluation in the ED; patient was found to be hypotensive systolic blood pressure 57/32.  She received IV bolus which improved her blood pressure 100/74.  She was placed on 5 L of oxygen. Covid PCR test came back positive.  Chest x-ray; increase in perihilar airspace disease suggests flash pulmonary edema, right pleural effusion.  Sodium 134, creatinine 2.6, BNP 2652, lactic acid 3.0, white blood cell 3.5, hemoglobin 13, platelet 97, INR 1.3..  Assessment & Plan:   Active Problems:   Acquired hypothyroidism   Cardiomyopathy ([place])   ICD (implantable cardioverter-defibrillator) in place   Type 2 diabetes mellitus with stage 3 chronic kidney disease, with long-term current use of insulin (HCC)   Acute on chronic systolic CHF (congestive heart failure) (HCC)   Seizure (HCC)   Pneumonia due to COVID-19 virus   Acute hypercapnic respiratory failure ([place])   1-Acute hypoxic respiratory Failure; Covid 19 PNA Initially presented hypoxemic, requiring 5 L oxygen.  Chest x ray with pulmonary edema and pleural effusion.   COVID pos Completed course of Remdesivir and IV steroids.  Procal is neg. CRP is down to 0.7, Currently on 0.5LNC, cont to wean O2. Pt is O2 naive at baseline Continue guaifenesin and Albuterol inhaler.   2-Acute systolic Heart failure exacerbation;  Continue with IV lasix, as BP allows.  Pt was started on midodrine to help with BP.  Dr. Tyrell Antonio discussed with sister, no Central line, picc line or IV pressors.  Palliative care following. Plan for home with hospice services and home O2 Cont to hold entresto due to AKI and hypotension. Continuing to hold coreg for now  3-VT; continue with amiodarone as pt tolerates   4-Hypotension, lactic acidosis;  Suspect related to covid and or cardiogenic shock.  No IV pressors per family  Continued on Midodrine.  Was continued with Cefepime. Given neg procalcitonin, will hold further abx Blood cultures noted to be neg  5-Leukopenia, thrombocytopenia. Related to viral illness. Cont to follow cbc trends   6-AKI on CKD stage IIIb; Prior Cr 1.8--2.1 Presents with cr at 2.6.  Suspect cardio renal syndrome plus hypotension.  Continues to tolerate IV lasix Following bmet trends  7-Depression -Pt reports feeling overall depressed -Denies suicidal or homicidal ideations -Have ordered and reviewed EKG. QTc is prolonged at over 570, thus held off on SSRI for now  DVT prophylaxis: Lovenox subq Code Status: DNR Family Communication: Pt in room, family not at bedside  Status is: Inpatient  Remains inpatient appropriate because:IV treatments appropriate due to intensity of illness or inability to take PO and Inpatient level of care appropriate due to severity of illness  Dispo: The patient is from: Home              Anticipated d/c is to: Home              Anticipated d/c date is: 1 day              Patient currently is not medically stable to d/c.   Difficult to place patient No   Consultants:   Palliative Care  Procedures:      Antimicrobials: Anti-infectives (From admission, onward)   Start     Dose/Rate Route Frequency Ordered Stop   10/12/20 1500  ceFEPIme (MAXIPIME) 2 g in sodium chloride 0.9 % 100 mL IVPB  Status:  Discontinued        2 g 200 mL/hr over 30 Minutes Intravenous Every 24 hours 10/11/20 1638 10/15/20 1654   10/12/20 1000  remdesivir 100 mg in sodium chloride 0.9 % 100 mL IVPB       "Followed by" Linked Group Details   100 mg 200 mL/hr over 30 Minutes Intravenous Daily 10/11/20 1631 10/15/20 1127   10/11/20 1645  remdesivir 200 mg in sodium chloride 0.9% 250 mL IVPB       "Followed by" Linked Group Details   200 mg 580 mL/hr over 30 Minutes Intravenous Once 10/11/20 1631 10/11/20 1845   10/11/20 1637  vancomycin variable dose per unstable renal function (pharmacist dosing)  Status:  Discontinued         Does not apply See admin instructions 10/11/20 1638 10/11/20 1722   10/11/20 1445  vancomycin (VANCOREADY) IVPB 1250 mg/250 mL  Status:  Discontinued        1,250 mg 166.7 mL/hr over 90 Minutes Intravenous  Once 10/11/20 1432 10/11/20 1722   10/11/20 1430  ceFEPIme (MAXIPIME) 2 g in sodium chloride 0.9 % 100 mL IVPB        2 g 200 mL/hr over 30 Minutes Intravenous  Once 10/11/20 1416 10/11/20 1517   10/11/20 1430  metroNIDAZOLE (FLAGYL) IVPB 500 mg        500 mg 100 mL/hr over 60 Minutes Intravenous  Once 10/11/20 1416 10/11/20 1556   10/11/20 1430  vancomycin (VANCOCIN) IVPB 1000 mg/200 mL premix  Status:  Discontinued        1,000 mg 200 mL/hr over 60 Minutes Intravenous  Once 10/11/20 1416 10/11/20 1432      Subjective: Eager to go home soon  Objective: Vitals:   10/15/20 1600 10/15/20 2000 10/16/20 0008 10/16/20 0400  BP: (!) 107/55 (!) 115/58 (!) 108/48 (!) 115/48  Pulse: 69 69 60 61  Resp: 14 20 18 17   Temp: 98.2 F (36.8 C) 97.8 F (36.6 C) (!) 97.5 F (36.4 C) (!) 97.5 F (36.4 C)  TempSrc: Oral Oral  Oral  SpO2: 100% 95% 95% 95%  Weight:    61 kg  Height:         Intake/Output Summary (Last 24 hours) at 10/16/2020 1644 Last data filed at 10/16/2020 1300 Gross per 24 hour  Intake 700 ml  Output 1800 ml  Net -1100 ml   Filed Weights   10/12/20 1602 10/13/20 0500 10/16/20 0400  Weight: 66.2 kg 67.1 kg 61 kg    Examination: General exam: Awake, laying in bed, in nad Respiratory system: Normal respiratory effort, no wheezing Cardiovascular system: regular rate, s1, s2 Gastrointestinal system: Soft, nondistended, positive BS Central nervous system: CN2-12 grossly intact, strength intact Extremities: Perfused, no clubbing Skin: Normal skin turgor, no notable  skin lesions seen Psychiatry: Mood normal // no visual hallucinations   Data Reviewed: I have personally reviewed following labs and imaging studies  CBC: Recent Labs  Lab 10/12/20 0407 10/13/20 0456 10/14/20 0348 10/15/20 0534 10/16/20 0411  WBC 1.8* 5.7 7.6 6.4 6.3  NEUTROABS 1.6* 5.1 6.6 5.8 5.7  HGB 13.3 13.6 12.8 13.3 13.0  HCT 41.3 43.1 38.6 43.7 39.2  MCV 95.8 95.8 93.0 97.5 92.5  PLT 79* 129* 121* 97* 88*   Basic Metabolic Panel: Recent Labs  Lab 10/12/20 0407 10/13/20 0456 10/14/20 0348 10/15/20 0534 10/16/20 0411  NA 136 138 138 134* 134*  K 4.2 4.0 3.9 4.4 5.2*  CL 98 97* 96* 93* 91*  CO2 24 27 30 27 30   GLUCOSE 174* 177* 189* 289* 303*  BUN 33* 36* 41* 43* 43*  CREATININE 2.51* 2.60* 2.52* 2.44* 2.29*  CALCIUM 7.9* 8.0* 8.2* 8.1* 8.1*  MG 1.7 1.7 1.6* 2.5* 2.5*   GFR: Estimated Creatinine Clearance: 18.6 mL/min (A) (by C-G formula based on SCr of 2.29 mg/dL (H)). Liver Function Tests: Recent Labs  Lab 10/12/20 0407 10/13/20 0456 10/14/20 0348 10/15/20 0534 10/16/20 0411  AST 20 19 20 31  76*  ALT 12 12 12 14 21   ALKPHOS 60 58 58 67 71  BILITOT 2.2* 2.0* 1.7* 2.1* 2.1*  PROT 5.4* 5.6* 5.4* 5.7* 5.7*  ALBUMIN 2.6* 2.7* 2.6* 2.7* 2.7*   No results for input(s): LIPASE, AMYLASE in the last 168 hours. No results for input(s): AMMONIA in the  last 168 hours. Coagulation Profile: Recent Labs  Lab 10/11/20 1535  INR 1.3*   Cardiac Enzymes: No results for input(s): CKTOTAL, CKMB, CKMBINDEX, TROPONINI in the last 168 hours. BNP (last 3 results) No results for input(s): PROBNP in the last 8760 hours. HbA1C: No results for input(s): HGBA1C in the last 72 hours. CBG: Recent Labs  Lab 10/11/20 1426  GLUCAP 187*   Lipid Profile: No results for input(s): CHOL, HDL, LDLCALC, TRIG, CHOLHDL, LDLDIRECT in the last 72 hours. Thyroid Function Tests: Recent Labs    10/15/20 0534  FREET4 0.93   Anemia Panel: No results for input(s): VITAMINB12, FOLATE, FERRITIN, TIBC, IRON, RETICCTPCT in the last 72 hours. Sepsis Labs: Recent Labs  Lab 10/11/20 1428 10/11/20 1726 10/11/20 1728 10/12/20 0407 10/13/20 0456  PROCALCITON  --  0.18  --  0.21 0.20  LATICACIDVEN 3.0*  --  1.9  --   --     Recent Results (from the past 240 hour(s))  Urine culture     Status: None   Collection Time: 10/11/20  5:15 AM   Specimen: In/Out Cath Urine  Result Value Ref Range Status   Specimen Description IN/OUT CATH URINE  Final   Special Requests NONE  Final   Culture   Final    NO GROWTH Performed at [facility], [place] [address] [place], [place] [address]    Report Status 10/13/2020 FINAL  Final  Blood Culture (routine x 2)     Status: None   Collection Time: 10/11/20  2:28 PM   Specimen: BLOOD  Result Value Ref Range Status   Specimen Description BLOOD RIGHT ANTECUBITAL  Final   Special Requests   Final    BOTTLES DRAWN AEROBIC AND ANAEROBIC Blood Culture results may not be optimal due to an inadequate volume of blood received in culture bottles   Culture   Final    NO GROWTH 5 DAYS Performed at [facility], [place]  [address], [place], [place] [address]    Report Status 10/16/2020 FINAL  Final  Blood Culture (routine x 2)     Status: None   Collection Time: 10/11/20  2:39 PM   Specimen: BLOOD  Result Value Ref Range  Status   Specimen Description BLOOD SITE NOT SPECIFIED  Final   Special Requests   Final    BOTTLES DRAWN AEROBIC AND ANAEROBIC Blood Culture results may not be optimal due to an inadequate volume of blood received in culture bottles   Culture   Final    NO GROWTH 5 DAYS Performed at [facility], [place] [address] [place], [place] [address]    Report Status 10/16/2020 FINAL  Final  SARS Coronavirus 2 by RT PCR (hospital order, performed in [facility] hospital lab) Nasopharyngeal Nasopharyngeal Swab     Status: Abnormal   Collection Time: 10/11/20  2:52 PM   Specimen: Nasopharyngeal Swab  Result Value Ref Range Status   SARS Coronavirus 2 POSITIVE (A) NEGATIVE Final    Comment: emailed L. Berdik RN 16:00 10/11/20 ([email or web address]) (NOTE) SARS-CoV-2 target nucleic acids are DETECTED  SARS-CoV-2 RNA is generally detectable in upper respiratory specimens  during the acute phase of infection.  Positive results are indicative  of the presence of the identified virus, but do not rule out bacterial infection or co-infection with other pathogens not detected by the test.  Clinical correlation with patient history and  other diagnostic information is necessary to determine patient infection status.  The expected result is negative.  Fact Sheet for Patients:   StrictlyIdeas.no   Fact Sheet for Healthcare Providers:   BankingDealers.co.za    This test is not yet approved or cleared by the [place] FDA and  has been authorized for detection and/or diagnosis of SARS-CoV-2 by FDA under an Emergency Use Authorization (EUA).  This EUA will remain in effect (meaning this test can be used) for the duration of  the  COVID-19 declaration under Section 564(b)(1) of the Act, 21 U.S.C. section 360-bbb-3(b)(1), unless the authorization is terminated or revoked sooner.  Performed at [facility], [place] [address] [place],  [address]       Radiology Studies: No results found.  Scheduled Meds: . albuterol  1 puff Inhalation Q6H  . amiodarone  200 mg Oral Daily  . vitamin C  500 mg Oral Daily  . aspirin EC  81 mg Oral Daily  . atorvastatin  80 mg Oral QHS  . clopidogrel  75 mg Oral Daily  . feeding supplement  237 mL Oral TID BM  . furosemide  40 mg Intravenous Q12H  . guaiFENesin  600 mg Oral BID  . levothyroxine  100 mcg Oral QAC breakfast  . midodrine  5 mg Oral TID WC  . multivitamin with minerals  1 tablet Oral Daily  . pantoprazole  20 mg Oral Daily  . predniSONE  50 mg Oral Daily  . sertraline  50 mg Oral Daily  . sodium chloride flush  3 mL Intravenous Q12H  . [START ON 10/18/2020] Vitamin D (Ergocalciferol)  50,000 Units Oral Q Wed  . zinc sulfate  220 mg Oral Daily   Continuous Infusions: . sodium chloride       LOS: 5 days   Marylu Lund, MD [facility] Pager On Amion  If 7PM-7AM, please contact night-coverage 10/16/2020, 4:44 PM

## 2020-10-16 NOTE — Progress Notes (Signed)
Inpatient Diabetes Program Recommendations  AACE/ADA: New Consensus Statement on Inpatient Glycemic Control   Target Ranges:  Prepandial:   less than 140 mg/dL      Peak postprandial:   less than 180 mg/dL (1-2 hours)      Critically ill patients:  140 - 180 mg/dL   Results for DEYANA, WNUK (MRN [id number]) as of 10/16/2020 09:49  Ref. Range 10/15/2020 05:34 10/16/2020 04:11  Glucose Latest Ref Range: 70 - 99 mg/dL 289 (H) 303 (H)  Results for CHARLOTTIE, PERAGINE (MRN [id number]) as of 10/16/2020 09:49  Ref. Range 06/18/2020 17:18  Hemoglobin A1C Latest Ref Range: 4.8 - 5.6 % 6.6 (H)   Review of Glycemic Control  Diabetes history: DM2 Outpatient Diabetes medications: Lantus 10 units daily Current orders for Inpatient glycemic control: None; Prednisone 50 mg daily  Inpatient Diabetes Program Recommendations:    Insulin: Please consider ordering CBGs and Novolog 0-9 units Q4H.  Thanks, Barnie Alderman, RN, MSN, CDE Diabetes Coordinator Inpatient Diabetes Program [phone] (Team Pager from 8am to 5pm)

## 2020-10-16 NOTE — TOC Progression Note (Signed)
Transition of Care [facility]) - Progression Note    Patient Details  Name: Jenna West MRN: [id number] Date of Birth: [birth year]  [facility] CM/SW Contact  Zenon Mayo, RN Phone Number: 10/16/2020, 7:54 PM  Clinical Narrative:    [facility], she may need ambulance transport. She hs oxygen at home and a bsc , sister is requesting a shower chair per Cheri with hospice. Hospice will provide patient with needed DME.    Expected Discharge Plan: Home w Hospice Care Barriers to Discharge: No Barriers Identified  Expected Discharge Plan and Services Expected Discharge Plan: [place] In-house Referral: Clinical Social Work Discharge Planning Services: CM Consult Post Acute Care Choice: Hospice Living arrangements for the past 2 months: [place]: RN [facility] Agency: [place] Date [place]: 10/15/20 Time [place]: [place] Representative spoke with at [place]: Tharon [phone]   Social Determinants of Health (SDOH) Interventions    Readmission Risk Interventions No flowsheet data found.

## 2020-10-16 NOTE — Progress Notes (Signed)
Occupational Therapy Treatment Patient Details Name: Jenna West MRN: [id number] DOB: [birth year] Today's Date: 10/16/2020    History of present illness 75 y.o. female hypothyroidism, chronic systolic heart failure Ef 20 %, CAD, hypertension, seizure, CKD stage IIIb, diabetes, IBS recent admission 1/18 for acute on chronic systolic heart failure exacerbation and rectal bleeding.  During last admission palliative care was recommended for patient due to her end-stage heart failure,  CKD and dementia.  Patient was found in bed unresponsive, not breathing normally per husband.   OT comments  Pt tearful and labile today during session, admits to being depressed. Pt able to complete toilet transfer at min A with 1 person HHA, peri care at min guard in standing, and set up for grooming. Updated dc recommendations to [place] and 24 hour assist as well as shower chair for bathing. Pt was able to complete session on RA today with cues for PLB, overall SpO2 >89%. OT will continue to follow acutely   Follow Up Recommendations  Home health OT;Supervision/Assistance - 24 hour    Equipment Recommendations  Tub/shower seat    Recommendations for Other Services      Precautions / Restrictions Precautions Precautions: Fall Restrictions Weight Bearing Restrictions: No       Mobility Bed Mobility Overal bed mobility: Needs Assistance Bed Mobility: Supine to Sit     Supine to sit: Min guard     General bed mobility comments: min guard for safety, increased time, use of bed rail  Transfers Overall transfer level: Needs assistance Equipment used: Rolling walker (2 wheeled) Transfers: Sit to/from [facility] Sit to Stand: Min guard         General transfer comment: vc fop safe hand placement    Balance Overall balance assessment: Needs assistance;History of Falls Sitting-balance support: Feet supported Sitting balance-Leahy Scale: Fair     Standing balance support:  Bilateral upper extremity supported;During functional activity Standing balance-Leahy Scale: Fair Standing balance comment: improved with BUE support                           ADL either performed or assessed with clinical judgement   ADL Overall ADL's : Needs assistance/impaired Eating/Feeding: Modified independent Eating/Feeding Details (indicate cue type and reason): comlains of lack of appetite, encourage eating food for energy to go home Grooming: Wash/dry hands;Wash/dry face;Set up;Sitting   Upper Body Bathing: Minimal assistance;Sitting Upper Body Bathing Details (indicate cue type and reason): assist for back Lower Body Bathing: Min guard;Sitting/lateral leans   Upper Body Dressing : Set up;Standing Upper Body Dressing Details (indicate cue type and reason): to don second gown like bath robe Lower Body Dressing: Maximal assistance;Sitting/lateral leans Lower Body Dressing Details (indicate cue type and reason): to don socks Toilet Transfer: Minimal assistance;Ambulation;RW;Min guard   Toileting- Water quality scientist and Hygiene: Min guard;Sit to/from stand Toileting - Clothing Manipulation Details (indicate cue type and reason): provided with warm wash cloth and patient able to complete peri care without assist     Functional mobility during ADLs: Min guard;Minimal assistance;Rolling walker;Cueing for safety       Vision       Perception     Praxis      Cognition Arousal/Alertness: Awake/alert Behavior During Therapy:  (depressed, tearful) Overall Cognitive Status: No family/caregiver present to determine baseline cognitive functioning                     Current Attention Level: Selective Memory:  Decreased short-term memory Following Commands: Follows one step commands consistently Safety/Judgement: Decreased awareness of safety Awareness: Emergent Problem Solving: Requires verbal cues;Requires tactile cues General Comments: Pt tearful  throughout, telling therapist that she is depressed, wants to go home. I do not think that she grasps implcations of covid        Exercises     Shoulder Instructions       General Comments Pt on RA throughout session, with activity, Pt 88-93% SpO2    Pertinent Vitals/ Pain       Pain Assessment: No/denies pain  Home Living                                          Prior Functioning/Environment              Frequency  Min 2X/week        Progress Toward Goals  OT Goals(current goals can now be found in the care plan section)  Progress towards OT goals: Progressing toward goals  Acute Rehab OT Goals Patient Stated Goal: to feel better and go home OT Goal Formulation: With patient Time For Goal Achievement: 10/27/20 Potential to Achieve Goals: Good  Plan Discharge plan needs to be updated;Frequency remains appropriate;Equipment recommendations need to be updated    Co-evaluation                 AM-PAC OT "6 Clicks" Daily Activity     Outcome Measure   Help from another person eating meals?: None Help from another person taking care of personal grooming?: A Little Help from another person toileting, which includes using toliet, bedpan, or urinal?: A Little Help from another person bathing (including washing, rinsing, drying)?: A Little Help from another person to put on and taking off regular upper body clothing?: A Little Help from another person to put on and taking off regular lower body clothing?: A Lot 6 Click Score: 18    End of Session Equipment Utilized During Treatment: Gait belt;Rolling walker  OT Visit Diagnosis: Unsteadiness on feet (R26.81);History of falling (Z91.81)   Activity Tolerance Patient tolerated treatment well   Patient Left Other (comment) (walking in room with PT)   Nurse Communication Mobility status        Time: 1405-1440 OT Time Calculation (min): 35 min  Charges: OT General Charges $OT Visit: 1  Visit OT Treatments $Self Care/Home Management : 8-22 mins $Therapeutic Activity: 8-22 mins  Jesse Sans OTR/L Acute Rehabilitation Services Pager: [phone] Office: [place] 10/16/2020, 3:20 PM

## 2020-10-16 NOTE — Progress Notes (Signed)
Physical Therapy Treatment Patient Details Name: Jenna West MRN: [id number] DOB: 1946/04/09 Today's Date: 10/16/2020    History of Present Illness 75 y.o. female hypothyroidism, chronic systolic heart failure Ef 20 %, CAD, hypertension, seizure, CKD stage IIIb, diabetes, IBS recent admission 1/18 for acute on chronic systolic heart failure exacerbation and rectal bleeding.  During last admission palliative care was recommended for patient due to her end-stage heart failure,  CKD and dementia.  Patient was found in bed unresponsive, not breathing normally per husband.    PT Comments    Pt was seen to mobilize after being started in therapy by OT.   Pt is on her feet and using RW away from surfaces, and is safe to turn and avoid obstacles with gait belt in place.  Her tolerance for gait was 57' and was less than fair balance dynamically for gait but can make a safer transition to home with a family member to assit and use of RW.  Follow acutely for goals of PT and focus on safety and standing endurance.   Follow Up Recommendations  Home health PT;Supervision for mobility/OOB     Equipment Recommendations  None recommended by PT    Recommendations for Other Services       Precautions / Restrictions Precautions Precautions: Fall Precaution Comments: watch BP and sats Restrictions Weight Bearing Restrictions: No    Mobility  Bed Mobility Overal bed mobility: Needs Assistance Bed Mobility: Supine to Sit     Supine to sit: Min guard     General bed mobility comments: up standing when PT arrived  Transfers Overall transfer level: Needs assistance Equipment used: Rolling walker (2 wheeled) Transfers: Sit to/from [facility] Sit to Stand: Min guard Stand pivot transfers: Min guard;Min assist       General transfer comment: vc fop safe hand placement  Ambulation/Gait Ambulation/Gait assistance: Min guard Gait Distance (Feet): 50 Feet Assistive device:  Rolling walker (2 wheeled) Gait Pattern/deviations: Step-to pattern;Shuffle;Wide base of support Gait velocity: decreased Gait velocity interpretation: <1.8 ft/sec, indicate of risk for recurrent falls General Gait Details: pt was down to 87% on sats wtih gait in room, note her use of RW was  unsafe, tends to get too close to [employer]             Wheelchair Mobility    Modified Rankin (Stroke Patients Only)       Balance Overall balance assessment: Needs assistance Sitting-balance support: Feet supported Sitting balance-Leahy Scale: Fair     Standing balance support: Bilateral upper extremity supported Standing balance-Leahy Scale: Fair Standing balance comment: less than fair with dynamic mobility                            Cognition Arousal/Alertness: Awake/alert Behavior During Therapy: Flat affect Overall Cognitive Status: No family/caregiver present to determine baseline cognitive functioning                   Orientation Level: Situation;Time Current Attention Level: Selective Memory: Decreased short-term memory Following Commands: Follows one step commands inconsistently;Follows one step commands with increased time Safety/Judgement: Decreased awareness of safety;Decreased awareness of deficits Awareness: Emergent Problem Solving: Slow processing;Requires verbal cues;Requires tactile cues General Comments: pt is motivated to walk and was up in chair with her food in front of her after preparing the walk      Exercises      General Comments General comments (skin integrity,  edema, etc.): room air with light drops in sats, cannula replaced on pt up in chair      Pertinent Vitals/Pain Pain Assessment: No/denies pain    Home Living                      Prior Function            PT Goals (current goals can now be found in the care plan section) Acute Rehab PT Goals Patient Stated Goal: to feel better and go home PT  Goal Formulation: With patient Progress towards PT goals: Progressing toward goals    Frequency    Min 3X/week      PT Plan Current plan remains appropriate    Co-evaluation              AM-PAC PT "6 Clicks" Mobility   Outcome Measure  Help needed turning from your back to your side while in a flat bed without using bedrails?: A Little Help needed moving from lying on your back to sitting on the side of a flat bed without using bedrails?: A Little Help needed moving to and from a bed to a chair (including a wheelchair)?: A Little Help needed standing up from a chair using your arms (e.g., wheelchair or bedside chair)?: A Little Help needed to walk in hospital room?: A Little Help needed climbing 3-5 steps with a railing? : A Lot 6 Click Score: 17    End of Session Equipment Utilized During Treatment: Oxygen Activity Tolerance: Patient limited by fatigue;Treatment limited secondary to medical complications (Comment) Patient left: in bed;with call bell/phone within reach;with bed alarm set;with nursing/sitter in room Nurse Communication: Mobility status;Other (comment) (O2 sats) PT Visit Diagnosis: Unsteadiness on feet (R26.81);Muscle weakness (generalized) (M62.81);Difficulty in walking, not elsewhere classified (R26.2)     Time: 0929-5747 PT Time Calculation (min) (ACUTE ONLY): 33 min  Charges:  $Gait Training: 8-22 mins $Therapeutic Activity: 8-22 mins                 Ramond Dial 10/16/2020, 5:57 PM  Mee Hives, PT MS Acute Rehab Dept. Number: [place]

## 2020-10-17 ENCOUNTER — Other Ambulatory Visit (HOSPITAL_COMMUNITY): Payer: Self-pay | Admitting: Internal Medicine

## 2020-10-17 DIAGNOSIS — E039 Hypothyroidism, unspecified: Secondary | ICD-10-CM

## 2020-10-17 DIAGNOSIS — L8990 Pressure ulcer of unspecified site, unspecified stage: Secondary | ICD-10-CM | POA: Insufficient documentation

## 2020-10-17 MED ORDER — PREDNISONE 10 MG PO TABS: 40.0000 mg | ORAL_TABLET | Freq: Every day | ORAL | 0 refills | Status: DC

## 2020-10-17 MED ORDER — FUROSEMIDE 40 MG PO TABS: 120.0000 mg | ORAL_TABLET | Freq: Every day | ORAL | 0 refills | Status: DC

## 2020-10-17 MED ORDER — ALBUTEROL SULFATE HFA 108 (90 BASE) MCG/ACT IN AERS: 1.0000 | INHALATION_SPRAY | Freq: Four times a day (QID) | RESPIRATORY_TRACT | 0 refills | Status: DC

## 2020-10-17 MED ORDER — MIDODRINE HCL 5 MG PO TABS: 5.0000 mg | ORAL_TABLET | Freq: Three times a day (TID) | ORAL | 0 refills | Status: DC

## 2020-10-17 MED FILL — FUROSEMIDE 40 MG TABLET: 40 | 30 days supply | Qty: 90 | Fill #0

## 2020-10-17 MED FILL — predniSONE 10 MG TABS: 10 | 4 days supply | Qty: 16 | Fill #0

## 2020-10-17 MED FILL — MIDODRINE HCL 5 MG TABS: 5 | 30 days supply | Qty: 90 | Fill #0

## 2020-10-17 MED FILL — PROAIR HFA 90 MCG INHALER: 108 (90 BAS | 10 days supply | Qty: 9 | Fill #0

## 2020-10-17 NOTE — Progress Notes (Signed)
Spoke to [employer] [place] sister [name] and provided discharge instructions. Patient's sister is aware of medication changes and has no questions. Patient's TOC medications is with RN. Primary RN made aware.

## 2020-10-17 NOTE — TOC Transition Note (Signed)
Transition of Care [facility]) - CM/SW Discharge Note   Patient Details  Name: Jenna West MRN: [id number] Date of Birth: 1945/12/24  Transition of Care [facility]) CM/SW Contact:  Zenon Mayo, RN Phone Number: 10/17/2020, 9:17 AM   Clinical Narrative:    NCM spoke with sister , [name] , she states she will be transporting patient home with hospice today.  NCM notified Cheri with [facility].  Sister states she has all the DME at the house she need for now.     Final next level of care: Home w Hospice Care Barriers to Discharge: No Barriers Identified   Patient Goals and CMS Choice Patient states their goals for this hospitalization and ongoing recovery are:: home with hospice      Discharge Placement                       Discharge Plan and Services In-house Referral: Clinical Social Work Discharge Planning Services: CM Consult Post Acute Care Choice: Hospice            DME Agency: NA       HH Arranged: RN [place] Agency: [place] Date [place]: 10/16/20 Time HH Agency Contacted: 1000 Representative spoke with at [place]: [place] Determinants of Health ([place]) Interventions     Readmission Risk Interventions No flowsheet data found.

## 2020-10-17 NOTE — Progress Notes (Signed)
Occupational Therapy Treatment Patient Details Name: Jenna West MRN: [id number] DOB: [birth year] Today's Date: 10/17/2020    History of present illness 75 y.o. female hypothyroidism, chronic systolic heart failure Ef 20 %, CAD, hypertension, seizure, CKD stage IIIb, diabetes, IBS recent admission 1/18 for acute on chronic systolic heart failure exacerbation and rectal bleeding.  During last admission palliative care was recommended for patient due to her end-stage heart failure,  CKD and dementia.  Patient was found in bed unresponsive, not breathing normally per husband.   OT comments  Pt received in bed, awake and alert, not agreeable for functional transfer, however, agreeable to EOB. OT attempted to encourage pt due to depressed and discouraged nature. S bed mobility, Mod I donning of sock, no signs or c/o  SOB. Mod I lateral scoot toward HOB, Mod I sitting to supine. Pt is really sad and ready to return home, concerns of not being able to walk around room unsupervised, OT educated pt of safety precautions. DC and Freq remains the same.    Follow Up Recommendations  Home health OT;Supervision/Assistance - 24 hour    Equipment Recommendations  Tub/shower seat    Recommendations for Other Services      Precautions / Restrictions Precautions Precautions: Fall Precaution Comments: watch BP and sats Restrictions Weight Bearing Restrictions: No       Mobility Bed Mobility Overal bed mobility: Needs Assistance Bed Mobility: Supine to Sit     Supine to sit: Supervision     General bed mobility comments: No physical assistance required good use of UE to assist with trunkn elevation and present BLE to EOB.  Transfers Overall transfer level: Needs assistance Equipment used: Rolling walker (2 wheeled) Transfers: Sit to/from [employer] transfer comment: declined any standing with OT    Balance Overall balance assessment: Needs  assistance Sitting-balance support: Feet supported Sitting balance-Leahy Scale: Fair     Standing balance support: Bilateral upper extremity supported Standing balance-Leahy Scale: Fair Standing balance comment: less than fair with dynamic mobility                           ADL either performed or assessed with clinical judgement   ADL Overall ADL's : Needs assistance/impaired     Grooming: Set up;Sitting;Brushing hair               Lower Body Dressing: Modified independent Lower Body Dressing Details (indicate cue type and reason): to don socks                     Vision Baseline Vision/History: Wears glasses;No visual deficits Wears Glasses: Reading only Patient Visual Report: No change from baseline Vision Assessment?: No apparent visual deficits   Perception     Praxis      Cognition Arousal/Alertness: Awake/alert Behavior During Therapy: Flat affect Overall Cognitive Status: No family/caregiver present to determine baseline cognitive functioning Area of Impairment: Problem solving;Awareness;Safety/judgement;Following commands;Memory;Attention;Orientation                 Orientation Level: Situation;Time Current Attention Level: Selective Memory: Decreased short-term memory Following Commands: Follows one step commands inconsistently;Follows one step commands with increased time Safety/Judgement: Decreased awareness of safety;Decreased awareness of deficits Awareness: Emergent Problem Solving: Slow processing;Requires verbal cues;Requires tactile cues General Comments: Pt really discouraged and depressed this session, not motivated to really engage in activities even with encouragement.  Exercises Exercises:  (LE strength 4+)   Shoulder Instructions       General Comments VSS    Pertinent Vitals/ Pain          Home Living Family/patient expects to be discharged to:: Private residence Living Arrangements:  Spouse/significant other;Other relatives Available Help at Discharge: Family;Available 24 hours/day Type of Home: House Home Access: Stairs to enter [employer] of Steps: 1 Entrance Stairs-Rails: None Home Layout: Two level;Able to live on main level with bedroom/bathroom;Laundry or work area in basement   Alternate Level Stairs-Rails: None Bathroom Shower/Tub: Teacher, early years/pre: Standard     Home Equipment: Grab bars - toilet;Grab bars - tub/shower;Hand held Tourist information centre manager - 4 wheels;Cane - single point;Bedside commode   Additional Comments: history from previous admit (pt is contradicting previous history)      Prior Functioning/Environment Level of Independence: Needs assistance  Gait / Transfers Assistance Needed: per pt she was walking alone, history says rollator ADL's / Homemaking Assistance Needed: per pt did her own care       Frequency  Min 2X/week        Progress Toward Goals  OT Goals(current goals can now be found in the care plan section)     Acute Rehab OT Goals Patient Stated Goal: to feel better and go home OT Goal Formulation: With patient Time For Goal Achievement: 10/27/20 Potential to Achieve Goals: Good  Plan      Co-evaluation                 AM-PAC OT "6 Clicks" Daily Activity     Outcome Measure   Help from another person eating meals?: None Help from another person taking care of personal grooming?: A Little Help from another person toileting, which includes using toliet, bedpan, or urinal?: A Little Help from another person bathing (including washing, rinsing, drying)?: A Little Help from another person to put on and taking off regular upper body clothing?: A Little Help from another person to put on and taking off regular lower body clothing?: A Little 6 Click Score: 19    End of Session Equipment Utilized During Treatment: Gait belt;Rolling walker  OT Visit Diagnosis: Unsteadiness on feet  (R26.81);History of falling (Z91.81)   Activity Tolerance Patient tolerated treatment well   Patient Left Other (comment) (walking in room with PT)   Nurse Communication Mobility status        Time: 2353-6144 OT Time Calculation (min): 20 min  Charges: OT General Charges $OT Visit: 1 Visit OT Treatments $Self Care/Home Management : 8-22 mins  Minus Breeding, MSOT, OTR/L  Supplemental Rehabilitation Services  [phone]    Marius Ditch 10/17/2020, 1:53 PM

## 2020-10-17 NOTE — Progress Notes (Signed)
IV access and telemetry monitor removed, no issues noted. Patient caregiver provided with discharge education and materials, verbalized understanding. No issues/questions/concerns voiced at this time. Patient discharged with all belongings.

## 2020-10-17 NOTE — Progress Notes (Signed)
Palliative Medicine RN Note: Pt's sister called the PMT office this am and asked if pt could be d/c today, as she seems very ready to leave [facility]. I sent a secure chat to [place] attending Dr Wyline Copas passing along the message. Sister [name] would like a call back at [phone]; requested TOC or hospice call her to confirm d/c.  Marjie Skiff. Jossue Rubenstein, RN, BSN, [facility] Palliative Medicine Team 10/17/2020 9:12 AM Office [phone]

## 2020-10-17 NOTE — Discharge Summary (Signed)
Physician Discharge Summary  Jenna West EXB:[id number] DOB: Apr 03, 1946 DOA: 10/11/2020  PCP: Ronita Hipps, MD  Admit date: 10/11/2020 Discharge date: 10/17/2020  Admitted From: Home Disposition:  Home with hospice  Recommendations for Outpatient Follow-up:  1. Follow up with PCP in 1-2 weeks 2. Follow up with hospice services  Discharge Condition:Stable CODE STATUS:DNR Diet recommendation: Regular   Brief/Interim Summary: 75 y.o.femalehypothyroidism, chronic systolic heart failureEf 20 %, CAD, hypertension, seizure, CKD stage IIIb, diabetes, IBS recent admission 1/18 for acute on chronic systolic heart failure exacerbation and rectal bleeding. During last admission palliative care was recommended for patient due to her end-stage heart failure,CKD and dementia. She was thoughtnotto be a candidate for endoscopy colonoscopy during last admission.  Patient presentedviaEMS, patient was found in bed unresponsive,not breathing normallyperhusband. EMS was called. On EMS arrival oxygen saturation was 83 on room air. Patient received ventilation and subsequentlyshe became more alert. Was placed on nonrebreather mask. And subsequently transitioned to 5 L in the ED.  Evaluation in the ED;patient was found to be hypotensive systolic blood pressure 10/27. She received IV bolus which improved her blood pressure 100/74. She was placed on 5 L of oxygen. Covid PCR test came back positive. Chest x-ray;increase in perihilar airspace disease suggests flash pulmonary edema, right pleural effusion. Sodium 134, creatinine 2.6, BNP 2652, lactic acid 3.0, white blood cell 3.5, hemoglobin 13, platelet 97, INR 1.3..  Discharge Diagnoses:  Active Problems:   Acquired hypothyroidism   Cardiomyopathy ([place])   ICD (implantable cardioverter-defibrillator) in place   Type 2 diabetes mellitus with stage 3 chronic kidney disease, with long-term current use of insulin (HCC)   Acute on  chronic systolic CHF (congestive heart failure) (HCC)   Seizure (HCC)   Pneumonia due to COVID-19 virus   Acute hypercapnic respiratory failure ([place])  1-Acute hypoxic respiratory Failure; Covid 19 PNA Initially presented hypoxemic, requiring 5 L oxygen.  Chest x ray with pulmonary edema and pleural effusion.  COVID pos  Completed course of Remdesivir, will d/c pt with 4 more days of prednisone Procal is neg. CRP is down to 0.7, Currently on 0.5LNC Continued on guaifenesin and Albuterol inhaler.   2-Acute systolic Heart failure exacerbation;  Continue with IV lasix, as BP allows.  Pt was started on midodrine to help with BP.  Dr. Tyrell Antonio discussed with sister, no Central line, picc line or IV pressors.  Palliative care following. Plan for home with hospice services and home O2 Held entresto due to AKI and hypotension.  Continuing to hold coreg for now  3-VT; continue with amiodarone as pt tolerates   4-Hypotension, lactic acidosis;  Suspect related to covid and or cardiogenic shock.  No IV pressors per family  Continued on Midodrine.  Was continued with Cefepime. Given neg procalcitonin, have discontinued abx Blood cx neg  5-Leukopenia, thrombocytopenia.  Related to viral illness.   6-AKI on CKD stage IIIb; Prior Cr 1.8--2.1 Presents with cr at 2.6.  Suspect cardio renal syndrome plus hypotension.  Tolerated IV lasix in hospital, continue on po lasix on d/c   7-Depression -Pt reports feeling overall depressed -Denies suicidal or homicidal ideations  Discharge Instructions   Allergies as of 10/17/2020      Reactions   Shellfish Allergy Swelling   Of mouth only   Sulfa Antibiotics Other (See Comments)   Internal bleeding      Medication List    STOP taking these medications   carvedilol 6.25 MG tablet Commonly known as: COREG   Entresto  24-26 MG Generic drug: sacubitril-valsartan   insulin glargine 100 UNIT/ML injection Commonly known as: LANTUS    spironolactone 25 MG tablet Commonly known as: ALDACTONE   zolpidem 10 MG tablet Commonly known as: AMBIEN     TAKE these medications   Accu-Chek Aviva Plus w/Device Kit Apply 1 Dose topically daily.   albuterol 108 (90 Base) MCG/ACT inhaler Commonly known as: VENTOLIN HFA Inhale 1 puff into the lungs every 6 (six) hours.   amiodarone 200 MG tablet Commonly known as: PACERONE Take 1 tablet (200 mg total) by mouth daily.   aspirin EC 81 MG tablet Take 81 mg by mouth daily.   atorvastatin 80 MG tablet Commonly known as: LIPITOR TAKE 1 TABLET BY MOUTH  DAILY What changed: when to take this   Benefiber Powd Take 1 Scoop by mouth daily.   clopidogrel 75 MG tablet Commonly known as: PLAVIX TAKE 1 TABLET BY MOUTH  DAILY   furosemide 40 MG tablet Commonly known as: Lasix Take 3 tablets (120 mg total) by mouth daily. What changed:   medication strength  how much to take  how to take this  when to take this  additional instructions   levothyroxine 100 MCG tablet Commonly known as: SYNTHROID Take 100 mcg by mouth daily before breakfast.   midodrine 5 MG tablet Commonly known as: PROAMATINE Take 1 tablet (5 mg total) by mouth 3 (three) times daily with meals.   Multi For Her Tabs Take 1 tablet by mouth daily.   nitroGLYCERIN 0.4 MG/SPRAY spray Commonly known as: Nitrolingual Place 1 spray under the tongue every 5 (five) minutes x 3 doses as needed for chest pain.   ondansetron 4 MG tablet Commonly known as: ZOFRAN Take 4 mg by mouth every 8 (eight) hours as needed for vomiting or nausea.   pantoprazole 20 MG tablet Commonly known as: PROTONIX Take 20 mg by mouth daily.   predniSONE 10 MG tablet Commonly known as: DELTASONE Take 4 tablets (40 mg total) by mouth daily for 4 days. Start taking on: October 18, 2020   ranolazine 500 MG 12 hr tablet Commonly known as: RANEXA Take 500 mg by mouth daily.   sertraline 50 MG tablet Commonly known as:  ZOLOFT Take 50 mg by mouth daily.   Vitamin D (Ergocalciferol) 1.25 MG (50000 UNIT) Caps capsule Commonly known as: DRISDOL Take 50,000 Units by mouth every Wednesday.       Allergies  Allergen Reactions  . Shellfish Allergy Swelling    Of mouth only  . Sulfa Antibiotics Other (See Comments)    Internal bleeding    Consultations:  Palliative Care  Procedures/Studies: CT CHEST WO CONTRAST  Result Date: 10/01/2020 CLINICAL DATA:  Dyspnea. EXAM: CT CHEST WITHOUT CONTRAST TECHNIQUE: Multidetector CT imaging of the chest was performed following the standard protocol without IV contrast. COMPARISON:  Radiograph of same day. FINDINGS: Cardiovascular: Atherosclerosis of thoracic aorta is noted without aneurysm formation. Mild cardiomegaly is noted. Calcifications are seen involving the dilated left ventricle. Extensive coronary artery calcifications are noted. Left-sided pacemaker is noted. No pericardial effusion is noted. Mediastinum/Nodes: No enlarged mediastinal or axillary lymph nodes. Thyroid gland, trachea, and esophagus demonstrate no significant findings. Lungs/Pleura: No pneumothorax is noted. Moderate to large bilateral pleural effusions are noted with associated atelectasis of both lower lobes. Upper Abdomen: No acute abnormality. Musculoskeletal: No chest wall mass or suspicious bone lesions identified. IMPRESSION: 1. Moderate to large bilateral pleural effusions are noted with associated atelectasis of both lower lobes.  2. Extensive coronary artery calcifications are noted suggesting coronary artery disease. 3. Aortic atherosclerosis. Aortic Atherosclerosis (ICD10-I70.0). Electronically Signed   By: Marijo Conception M.D.   On: 10/01/2020 15:12   CT Abdomen Pelvis W Contrast  Result Date: 09/27/2020 CLINICAL DATA:  Abdominal pain with rectal bleeding EXAM: CT ABDOMEN AND PELVIS WITH CONTRAST TECHNIQUE: Multidetector CT imaging of the abdomen and pelvis was performed using the  standard protocol following bolus administration of intravenous contrast. CONTRAST:  94m OMNIPAQUE IOHEXOL 300 MG/ML  SOLN COMPARISON:  03/29/2020 FINDINGS: LOWER CHEST: Large pleural effusions. HEPATOBILIARY: Large amount of ascites in the upper abdomen. No focal hepatic lesion. There is cholelithiasis without acute inflammation. PANCREAS: Normal pancreas. No ductal dilatation or peripancreatic fluid collection. SPLEEN: Normal. ADRENALS/URINARY TRACT: The adrenal glands are normal. No hydronephrosis, nephroureterolithiasis or solid renal mass. The urinary bladder is normal for degree of distention STOMACH/BOWEL: There is no hiatal hernia. Normal duodenal course and caliber. No small bowel dilatation or inflammation. Rectosigmoid diverticulosis without acute inflammation. Normal appendix. VASCULAR/LYMPHATIC: There is calcific atherosclerosis of the abdominal aorta. No lymphadenopathy. REPRODUCTIVE: Status post hysterectomy. No adnexal mass. MUSCULOSKELETAL. Multilevel degenerative disc disease and facet arthrosis. No bony spinal canal stenosis. OTHER: Anasarca IMPRESSION: 1. Upper abdominal ascites, anasarca and large pleural effusions, favoring volume overload. 2. Diverticulosis without acute inflammation. Aortic Atherosclerosis (ICD10-I70.0). Electronically Signed   By: KUlyses JarredM.D.   On: 09/27/2020 22:12   [place]domen Limited  Result Date: 09/29/2020 CLINICAL DATA:  Ascites. EXAM: LIMITED ABDOMEN ULTRASOUND FOR ASCITES TECHNIQUE: Limited ultrasound survey for ascites was performed in all four abdominal quadrants. COMPARISON:  CT on 09/27/2020 FINDINGS: Mild ascites is seen in all 4 abdominal quadrants. Diffusely increased parenchymal echogenicity of the liver is also noted, consistent with hepatic steatosis. IMPRESSION: Mild ascites. Hepatic steatosis. Electronically Signed   By: JMarlaine HindM.D.   On: 09/29/2020 15:32   DG Chest Port 1 View  Result Date: 10/11/2020 CLINICAL DATA:  Short of breath  EXAM: PORTABLE CHEST 1 VIEW COMPARISON:  09/27/2020 FINDINGS: Stable large cardiac silhouette. Interval increase in perihilar diffuse airspace disease. Bilateral pleural effusions. No pneumothorax. Breast prosthetic noted. LEFT chest wall pacemaker. IMPRESSION: Increase in perihilar airspace disease suggest flash pulmonary edema RIGHT pleural effusion. Electronically Signed   By: SSuzy BouchardM.D.   On: 10/11/2020 14:59   DG CHEST PORT 1 VIEW  Result Date: 09/30/2020 CLINICAL DATA:  Evaluate cardiopulmonary status. EXAM: PORTABLE CHEST 1 VIEW COMPARISON:  09/27/2020 and 06/18/2020 FINDINGS: Lordotic technique is demonstrated. Dual lead left-sided cardiac pacemaker unchanged. Lungs are hypoinflated demonstrate interval worsening opacification over the right mid to lower lung with mild stable left base opacification. Likely small bilateral pleural effusions unchanged on the left and possible worsening on the right. Stable cardiomegaly with known calcification along the border of the left ventricle. Remainder of the exam is unchanged. IMPRESSION: 1. Interval worsening opacification over the right mid to lower lung with stable left base opacification. Findings may be due to infection or atelectasis. Small bilateral pleural effusions right greater than left. 2. Stable cardiomegaly with calcification along the left ventricular wall. Electronically Signed   By: DMarin OlpM.D.   On: 09/30/2020 08:43   DG Chest Port 1 View  Result Date: 09/27/2020 CLINICAL DATA:  Diarrhea EXAM: PORTABLE CHEST 1 VIEW COMPARISON:  06/18/2020, 11/14/2017 FINDINGS: Partially visualized sclerotic lesion in the left humerus. Left-sided pacing device as before. Small moderate bilateral pleural effusions and basilar airspace disease. Cardiomegaly with central vascular  congestion. Calcified left ventricular infarct. No pneumothorax IMPRESSION: 1. Cardiomegaly with vascular congestion and small to moderate bilateral effusions. 2.  Basilar airspace disease, atelectasis versus pneumonia 3. Curvilinear calcification over left ventricular apex consistent with calcified infarct Electronically Signed   By: Donavan Foil M.D.   On: 09/27/2020 20:29   ECHOCARDIOGRAM COMPLETE  Result Date: 09/28/2020    ECHOCARDIOGRAM REPORT   Patient Name:   Jenna West Date of Exam: 09/28/2020 Medical Rec #:  [id number]        Height:       64.0 in Accession #:    [id number]       Weight:       144.0 lb Date of Birth:  [birth year]         BSA:          1.701 m Patient Age:    51 years         BP:           128/71 mmHg Patient Gender: F                HR:           70 bpm. Exam Location:  Inpatient Procedure: 2D Echo, Cardiac Doppler, Color Doppler and Intracardiac            Opacification Agent Indications:    CHF  History:        Patient has prior history of Echocardiogram examinations, most                 recent 08/10/2019. CHF and Cardiomyopathy, CAD and Previous                 Myocardial Infarction, Defibrillator, Carotid Disease,                 Signs/Symptoms:Shortness of Breath; Risk Factors:Hypertension                 and Diabetes. Pleural effusions.  Sonographer:    Dustin Flock Referring Phys: [id number] [place]  1. Left ventricular ejection fraction, by estimation, is <20%. The left ventricle has severely decreased function. The left ventricle demonstrates global hypokinesis. The left ventricular internal cavity size was moderately dilated. Left ventricular diastolic parameters are consistent with Grade II diastolic dysfunction (pseudonormalization). Elevated left atrial pressure.  2. Right ventricular systolic function is mildly reduced. The right ventricular size is normal. There is mildly elevated pulmonary artery systolic pressure. The estimated right ventricular systolic pressure is 64.4 mmHg.  3. Right atrial size was mildly dilated.  4. The mitral valve is normal in structure. Mild mitral valve regurgitation.  5.  Tricuspid valve regurgitation is mild to moderate.  6. The aortic valve is tricuspid. Aortic valve regurgitation is not visualized. No aortic stenosis is present.  7. The inferior vena cava is dilated in size with <50% respiratory variability, suggesting right atrial pressure of 15 mmHg. FINDINGS  Left Ventricle: Left ventricular ejection fraction, by estimation, is <20%. The left ventricle has severely decreased function. The left ventricle demonstrates global hypokinesis. Definity contrast agent was given IV to delineate the left ventricular endocardial borders. The left ventricular internal cavity size was moderately dilated. There is no left ventricular hypertrophy. Left ventricular diastolic parameters are consistent with Grade II diastolic dysfunction (pseudonormalization). Elevated left  atrial pressure. Right Ventricle: The right ventricular size is normal. Right vetricular wall thickness was not well visualized. Right ventricular systolic function is mildly reduced. There is mildly elevated pulmonary  artery systolic pressure. The tricuspid regurgitant velocity is 2.60 m/s, and with an assumed right atrial pressure of 15 mmHg, the estimated right ventricular systolic pressure is 26.8 mmHg. Left Atrium: Left atrial size was normal in size. Right Atrium: Right atrial size was mildly dilated. Pericardium: There is no evidence of pericardial effusion. Mitral Valve: The mitral valve is normal in structure. Mild mitral valve regurgitation. Tricuspid Valve: The tricuspid valve is normal in structure. Tricuspid valve regurgitation is mild to moderate. Aortic Valve: The aortic valve is tricuspid. Aortic valve regurgitation is not visualized. No aortic stenosis is present. Pulmonic Valve: The pulmonic valve was grossly normal. Pulmonic valve regurgitation is trivial. Aorta: The aortic root and ascending aorta are structurally normal, with no evidence of dilitation. Venous: The inferior vena cava is dilated in size  with less than 50% respiratory variability, suggesting right atrial pressure of 15 mmHg. IAS/Shunts: The interatrial septum was not well visualized. Additional Comments: There is a small pleural effusion in the left lateral region.  LEFT VENTRICLE PLAX 2D LVIDd:         5.60 cm  Diastology LVIDs:         5.30 cm  LV e' medial:    3.37 cm/s LV PW:         1.00 cm  LV E/e' medial:  21.2 LV IVS:        1.10 cm  LV e' lateral:   5.87 cm/s LVOT diam:     2.00 cm  LV E/e' lateral: 12.2 LV SV:         28 LV SV Index:   16 LVOT Area:     3.14 cm  RIGHT VENTRICLE RV Basal diam:  3.80 cm RV S prime:     7.94 cm/s TAPSE (M-mode): 1.9 cm LEFT ATRIUM             Index       RIGHT ATRIUM           Index LA diam:        4.20 cm 2.47 cm/m  RA Area:     17.50 cm LA Vol (A2C):   53.2 ml 31.27 ml/m RA Volume:   49.10 ml  28.86 ml/m LA Vol (A4C):   50.6 ml 29.74 ml/m LA Biplane Vol: 53.1 ml 31.21 ml/m  AORTIC VALVE LVOT Vmax:   56.10 cm/s LVOT Vmean:  37.100 cm/s LVOT VTI:    0.089 m  AORTA Ao Root diam: 2.70 cm MITRAL VALVE               TRICUSPID VALVE MV Area (PHT): 8.92 cm    TR Peak grad:   27.0 mmHg MV Decel Time: 85 msec     TR Vmax:        260.00 cm/s MV E velocity: 71.60 cm/s MV A velocity: 80.10 cm/s  SHUNTS MV E/A ratio:  0.89        Systemic VTI:  0.09 m                            Systemic Diam: 2.00 cm Oswaldo Milian MD Electronically signed by Oswaldo Milian MD Signature Date/Time: 09/28/2020/11:27:39 AM    Final     Subjective: Eager to go home  Discharge Exam: Vitals:   10/17/20 0800 10/17/20 0900  BP: (!) 111/53 (!) 114/52  Pulse: 70 70  Resp: 20 18  Temp:    SpO2: 95% 96%   Vitals:  10/17/20 0400 10/17/20 0700 10/17/20 0800 10/17/20 0900  BP: 120/72  (!) 111/53 (!) 114/52  Pulse: 60  70 70  Resp: '18  20 18  ' Temp: 98.1 F (36.7 C) 98 F (36.7 C)    TempSrc: Oral Oral    SpO2:   95% 96%  Weight:      Height:        General: Pt is alert, awake, not in acute  distress Cardiovascular: RRR, S1/S2 +, no rubs, no gallops Respiratory: CTA bilaterally, no wheezing, no rhonchi Abdominal: Soft, NT, ND, bowel sounds + Extremities: no edema, no cyanosis   The results of significant diagnostics from this hospitalization (including imaging, microbiology, ancillary and laboratory) are listed below for reference.     Microbiology: Recent Results (from the past 240 hour(s))  Urine culture     Status: None   Collection Time: 10/11/20  5:15 AM   Specimen: In/Out Cath Urine  Result Value Ref Range Status   Specimen Description IN/OUT CATH URINE  Final   Special Requests NONE  Final   Culture   Final    NO GROWTH Performed at [facility], [address], [place], [place] [address]    Report Status 10/13/2020 FINAL  Final  Blood Culture (routine x 2)     Status: None   Collection Time: 10/11/20  2:28 PM   Specimen: BLOOD  Result Value Ref Range Status   Specimen Description BLOOD RIGHT ANTECUBITAL  Final   Special Requests   Final    BOTTLES DRAWN AEROBIC AND ANAEROBIC Blood Culture results may not be optimal due to an inadequate volume of blood received in culture bottles   Culture   Final    NO GROWTH 5 DAYS Performed at [facility], [place] [address], [place], [place] [address]    Report Status 10/16/2020 FINAL  Final  Blood Culture (routine x 2)     Status: None   Collection Time: 10/11/20  2:39 PM   Specimen: BLOOD  Result Value Ref Range Status   Specimen Description BLOOD SITE NOT SPECIFIED  Final   Special Requests   Final    BOTTLES DRAWN AEROBIC AND ANAEROBIC Blood Culture results may not be optimal due to an inadequate volume of blood received in culture bottles   Culture   Final    NO GROWTH 5 DAYS Performed at [facility], [place] [address] [place], [place] [address]    Report Status 10/16/2020 FINAL  Final  SARS Coronavirus 2 by RT PCR (hospital order, performed in [facility] hospital lab) Nasopharyngeal  Nasopharyngeal Swab     Status: Abnormal   Collection Time: 10/11/20  2:52 PM   Specimen: Nasopharyngeal Swab  Result Value Ref Range Status   SARS Coronavirus 2 POSITIVE (A) NEGATIVE Final    Comment: emailed L. Berdik RN 16:00 10/11/20 ([email or web address]) (NOTE) SARS-CoV-2 target nucleic acids are DETECTED  SARS-CoV-2 RNA is generally detectable in upper respiratory specimens  during the acute phase of infection.  Positive results are indicative  of the presence of the identified virus, but do not rule out bacterial infection or co-infection with other pathogens not detected by the test.  Clinical correlation with patient history and  other diagnostic information is necessary to determine patient infection status.  The expected result is negative.  Fact Sheet for Patients:   StrictlyIdeas.no   Fact Sheet for Healthcare Providers:   BankingDealers.co.za    This test is not yet approved or  cleared by the [place] and  has been authorized for detection and/or diagnosis of SARS-CoV-2 by FDA under an Emergency Use Authorization (EUA).  This EUA will remain in effect (meaning this test can be used) for the duration of  the  COVID-19 declaration under Section 564(b)(1) of the Act, 21 U.S.C. section 360-bbb-3(b)(1), unless the authorization is terminated or revoked sooner.  Performed at [facility], [place] [address], [place], [place] [address]      Labs: BNP (last 3 results) Recent Labs    06/18/20 1718 09/27/20 1448 10/11/20 1428  BNP 714.1* 2,539.8* 8,003.4*   Basic Metabolic Panel: Recent Labs  Lab 10/12/20 0407 10/13/20 0456 10/14/20 0348 10/15/20 0534 10/16/20 0411  NA 136 138 138 134* 134*  K 4.2 4.0 3.9 4.4 5.2*  CL 98 97* 96* 93* 91*  CO2 '24 27 30 27 30  ' GLUCOSE 174* 177* 189* 289* 303*  BUN 33* 36* 41* 43* 43*  CREATININE 2.51* 2.60* 2.52* 2.44* 2.29*  CALCIUM 7.9* 8.0* 8.2* 8.1* 8.1*  MG 1.7 1.7 1.6*  2.5* 2.5*   Liver Function Tests: Recent Labs  Lab 10/12/20 0407 10/13/20 0456 10/14/20 0348 10/15/20 0534 10/16/20 0411  AST '20 19 20 31 ' 76*  ALT '12 12 12 14 21  ' ALKPHOS 60 58 58 67 71  BILITOT 2.2* 2.0* 1.7* 2.1* 2.1*  PROT 5.4* 5.6* 5.4* 5.7* 5.7*  ALBUMIN 2.6* 2.7* 2.6* 2.7* 2.7*   No results for input(s): LIPASE, AMYLASE in the last 168 hours. No results for input(s): AMMONIA in the last 168 hours. CBC: Recent Labs  Lab 10/12/20 0407 10/13/20 0456 10/14/20 0348 10/15/20 0534 10/16/20 0411  WBC 1.8* 5.7 7.6 6.4 6.3  NEUTROABS 1.6* 5.1 6.6 5.8 5.7  HGB 13.3 13.6 12.8 13.3 13.0  HCT 41.3 43.1 38.6 43.7 39.2  MCV 95.8 95.8 93.0 97.5 92.5  PLT 79* 129* 121* 97* 88*   Cardiac Enzymes: No results for input(s): CKTOTAL, CKMB, CKMBINDEX, TROPONINI in the last 168 hours. BNP: Invalid input(s): POCBNP CBG: Recent Labs  Lab 10/11/20 1426  GLUCAP 187*   D-Dimer Recent Labs    10/15/20 0534 10/16/20 0411  DDIMER 2.55* 2.13*   Hgb A1c No results for input(s): HGBA1C in the last 72 hours. Lipid Profile No results for input(s): CHOL, HDL, LDLCALC, TRIG, CHOLHDL, LDLDIRECT in the last 72 hours. Thyroid function studies No results for input(s): TSH, T4TOTAL, T3FREE, THYROIDAB in the last 72 hours.  Invalid input(s): FREET3 Anemia work up No results for input(s): VITAMINB12, FOLATE, FERRITIN, TIBC, IRON, RETICCTPCT in the last 72 hours. Urinalysis    Component Value Date/Time   COLORURINE AMBER (A) 10/11/2020 1416   APPEARANCEUR HAZY (A) 10/11/2020 1416   LABSPEC 1.012 10/11/2020 1416   PHURINE 5.0 10/11/2020 1416   GLUCOSEU NEGATIVE 10/11/2020 1416   HGBUR SMALL (A) 10/11/2020 1416   BILIRUBINUR NEGATIVE 10/11/2020 1416   [place] 10/11/2020 1416   PROTEINUR 30 (A) 10/11/2020 1416   NITRITE NEGATIVE 10/11/2020 1416   LEUKOCYTESUR NEGATIVE 10/11/2020 1416   Sepsis Labs Invalid input(s): PROCALCITONIN,  WBC,  LACTICIDVEN Microbiology Recent  Results (from the past 240 hour(s))  Urine culture     Status: None   Collection Time: 10/11/20  5:15 AM   Specimen: In/Out Cath Urine  Result Value Ref Range Status   Specimen Description IN/OUT CATH URINE  Final   Special Requests NONE  Final   Culture   Final    NO GROWTH Performed at  [facility], [place] [address], [place], [place] [address]    Report Status 10/13/2020 FINAL  Final  Blood Culture (routine x 2)     Status: None   Collection Time: 10/11/20  2:28 PM   Specimen: BLOOD  Result Value Ref Range Status   Specimen Description BLOOD RIGHT ANTECUBITAL  Final   Special Requests   Final    BOTTLES DRAWN AEROBIC AND ANAEROBIC Blood Culture results may not be optimal due to an inadequate volume of blood received in culture bottles   Culture   Final    NO GROWTH 5 DAYS Performed at [facility], [place] [address] [place], [place] [address]    Report Status 10/16/2020 FINAL  Final  Blood Culture (routine x 2)     Status: None   Collection Time: 10/11/20  2:39 PM   Specimen: BLOOD  Result Value Ref Range Status   Specimen Description BLOOD SITE NOT SPECIFIED  Final   Special Requests   Final    BOTTLES DRAWN AEROBIC AND ANAEROBIC Blood Culture results may not be optimal due to an inadequate volume of blood received in culture bottles   Culture   Final    NO GROWTH 5 DAYS Performed at [facility], [place] [address], [place], [place] [address]    Report Status 10/16/2020 FINAL  Final  SARS Coronavirus 2 by RT PCR (hospital order, performed in [facility] hospital lab) Nasopharyngeal Nasopharyngeal Swab     Status: Abnormal   Collection Time: 10/11/20  2:52 PM   Specimen: Nasopharyngeal Swab  Result Value Ref Range Status   SARS Coronavirus 2 POSITIVE (A) NEGATIVE Final    Comment: emailed L. Berdik RN 16:00 10/11/20 ([email or web address]) (NOTE) SARS-CoV-2 target nucleic acids are DETECTED  SARS-CoV-2 RNA is generally detectable in upper respiratory specimens  during the  acute phase of infection.  Positive results are indicative  of the presence of the identified virus, but do not rule out bacterial infection or co-infection with other pathogens not detected by the test.  Clinical correlation with patient history and  other diagnostic information is necessary to determine patient infection status.  The expected result is negative.  Fact Sheet for Patients:   StrictlyIdeas.no   Fact Sheet for Healthcare Providers:   BankingDealers.co.za    This test is not yet approved or cleared by the [place] FDA and  has been authorized for detection and/or diagnosis of SARS-CoV-2 by FDA under an Emergency Use Authorization (EUA).  This EUA will remain in effect (meaning this test can be used) for the duration of  the  COVID-19 declaration under Section 564(b)(1) of the Act, 21 U.S.C. section 360-bbb-3(b)(1), unless the authorization is terminated or revoked sooner.  Performed at [facility], [place] [address] [place], [place] [address]    Time spent: 30 min  SIGNED:   Marylu Lund, MD  [facility] 10/17/2020, 11:48 AM  If 7PM-7AM, please contact night-coverage

## 2020-10-20 DIAGNOSIS — Z95810 Presence of automatic (implantable) cardiac defibrillator: Secondary | ICD-10-CM | POA: Insufficient documentation

## 2020-10-23 ENCOUNTER — Telehealth: Payer: Self-pay | Admitting: Cardiology

## 2020-10-23 DIAGNOSIS — Z66 Do not resuscitate: Secondary | ICD-10-CM

## 2020-10-23 DIAGNOSIS — N179 Acute kidney failure, unspecified: Secondary | ICD-10-CM

## 2020-10-23 DIAGNOSIS — J810 Acute pulmonary edema: Secondary | ICD-10-CM

## 2020-10-23 DIAGNOSIS — Z515 Encounter for palliative care: Secondary | ICD-10-CM

## 2020-10-23 DIAGNOSIS — Z7189 Other specified counseling: Secondary | ICD-10-CM

## 2020-10-23 DIAGNOSIS — Z789 Other specified health status: Secondary | ICD-10-CM

## 2020-10-23 DIAGNOSIS — U071 COVID-19: Secondary | ICD-10-CM

## 2020-10-23 NOTE — Telephone Encounter (Signed)
Spoke with [name](sister on DPR) who reports patient has DNR at this time . Patient has periods of confusion and is under the care of [facility] in [place]. Family aware that [employer] will coordinate with hospice to come and turn off tachy therapies on ICD. Reassure that pacing functions will be left active.  Contacted [place] and Sharyn Lull or Lattie Haw will be POC for [employer] at [phone]. E-,ail sent to [employer] reps Ronalee Belts and Demi to coordinate with Hospice to disable tachy therapies.

## 2020-10-23 NOTE — Telephone Encounter (Signed)
Patient's sister, [name], called to make Dr Curt Bears aware that the patient is now in hospice and they are wanting to cut off her defibrillator.  [name] is requesting advisement from Dr. Curt Bears prior to agreeing to do so.

## 2020-10-23 NOTE — Telephone Encounter (Signed)
Per Dr. Curt Bears:  OK to arrange for ICD to be turned off per hospice request. Will forward to device clinic to arrange request

## 2020-10-26 ENCOUNTER — Telehealth: Payer: Self-pay

## 2020-10-26 NOTE — Telephone Encounter (Signed)
The order was faxed with the help of [place], rn.

## 2020-10-26 NOTE — Telephone Encounter (Signed)
The hospice nurse Lattie Haw needs a copy of the order from Dr. Curt Bears to turn off tachy therapies off. I spoke with Reginal Lutes and she is going to help me get this taken care of.

## 2020-10-27 ENCOUNTER — Ambulatory Visit: Payer: Medicare Other | Admitting: Cardiology

## 2020-11-01 ENCOUNTER — Telehealth: Payer: Self-pay

## 2020-11-01 NOTE — Telephone Encounter (Signed)
The patient ICD tachy therapies has been disabled. The husband states she will not be sending a transmission. I told him I will let Sharman Cheek aware.

## 2020-11-02 ENCOUNTER — Encounter: Payer: Medicare Other | Admitting: Student

## 2020-11-03 DIAGNOSIS — R569 Unspecified convulsions: Secondary | ICD-10-CM | POA: Diagnosis not present

## 2020-11-07 ENCOUNTER — Other Ambulatory Visit: Payer: Self-pay | Admitting: Cardiology

## 2020-11-08 NOTE — Telephone Encounter (Signed)
Refill sent to pharmacy.   

## 2020-11-14 DEATH — deceased

## 2022-01-29 IMAGING — CT CT ABD-PELV W/ CM
2 of 5 series · 13 of 46 positions shown, 15 images · IV contrast (iopamidol)
Comparison: None.

CLINICAL DATA: Weight loss

EXAM:
CT ABDOMEN AND PELVIS WITH CONTRAST
TECHNIQUE: Multidetector CT imaging of the abdomen and pelvis was performed
using the standard protocol following bolus administration of
intravenous contrast.
CONTRAST:  60mL [id number] IOPAMIDOL ([id number]) INJECTION 61%

[Series 2: abd pelvis 5.00 br40 s3 axial · axial · 0.60mm/px · z∈[+1288,+1728]mm · 10 of 98 slices shown, 12 images]
[im 5/98  soft-tissue]
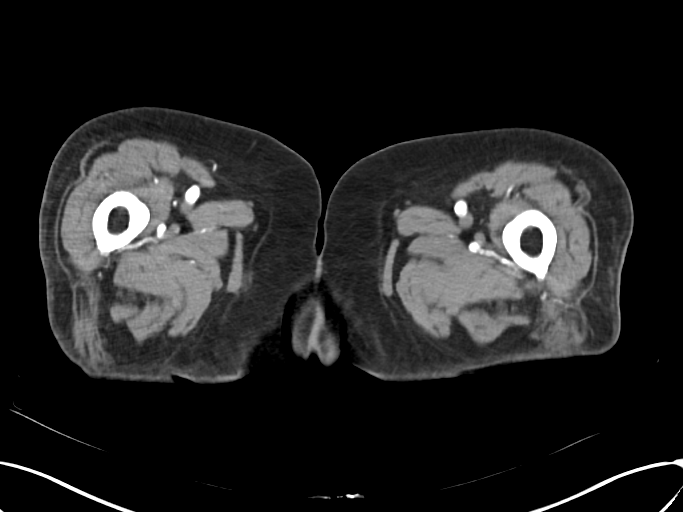
[im 5/98  bone]
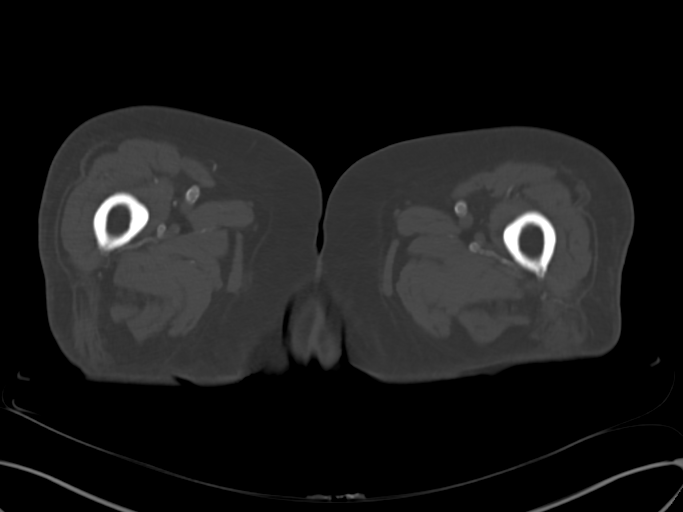
[im 15/98  soft-tissue]
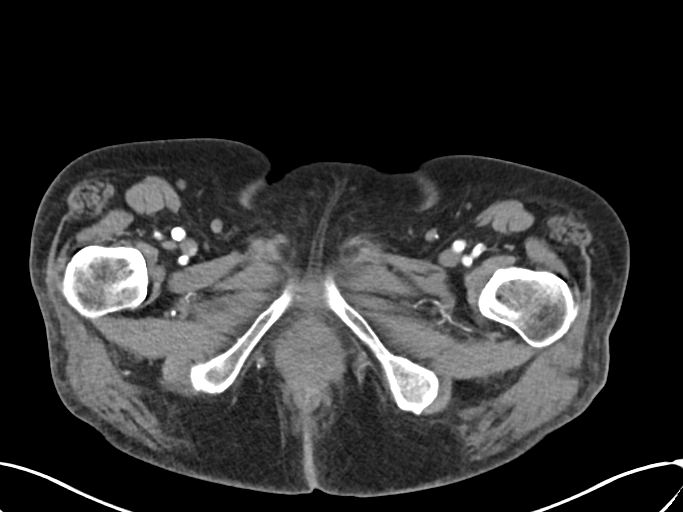
[im 25/98  soft-tissue]
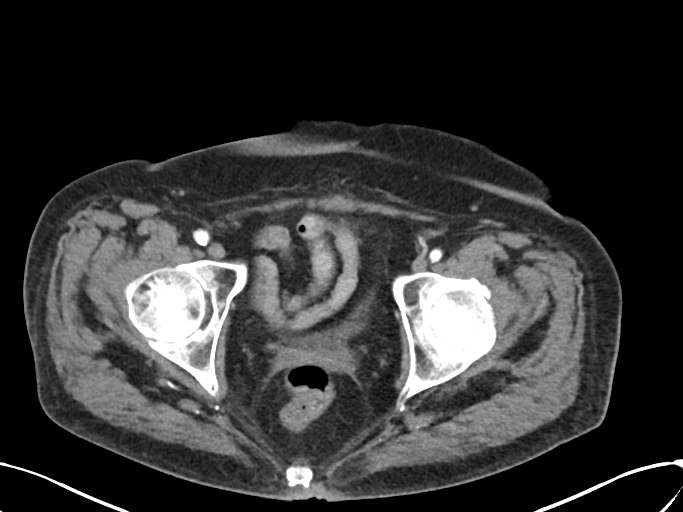
[im 34/98  soft-tissue]
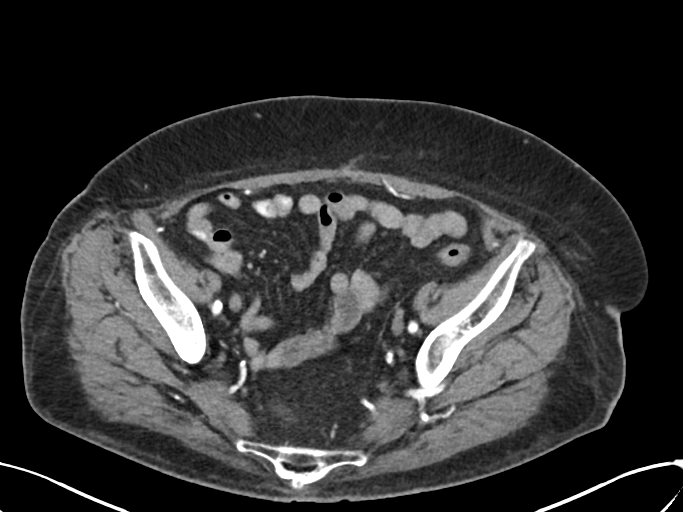
[im 44/98  soft-tissue]
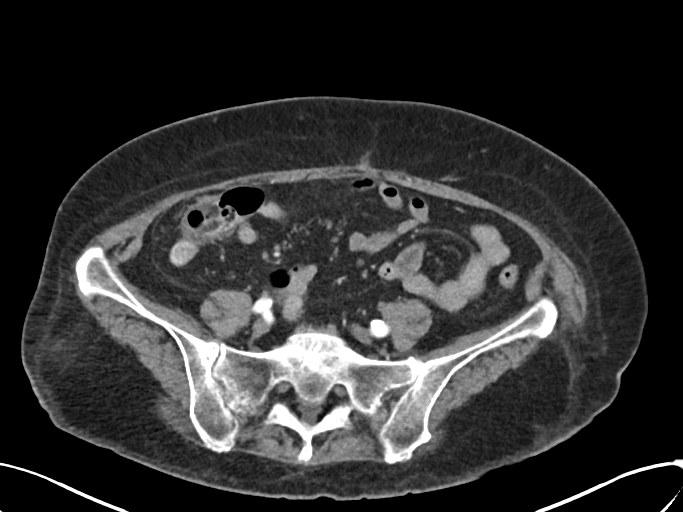
[im 54/98  soft-tissue]
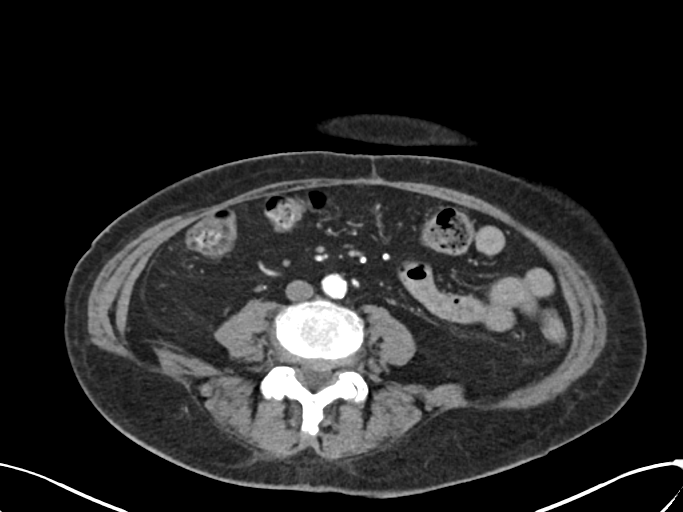
[im 64/98  soft-tissue]
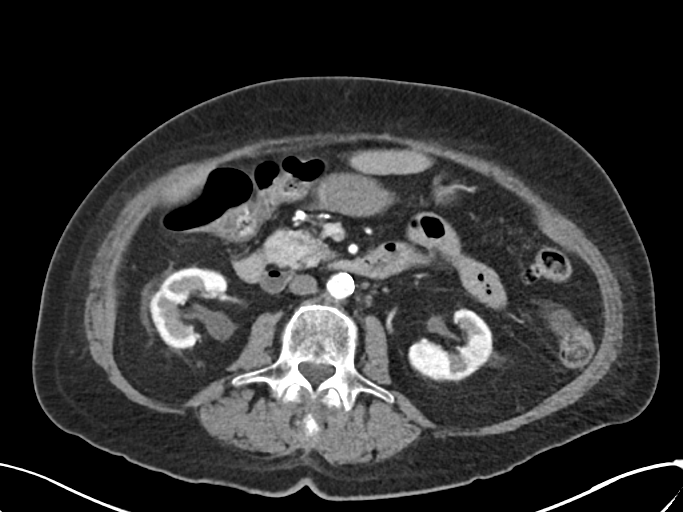
[im 73/98  soft-tissue]
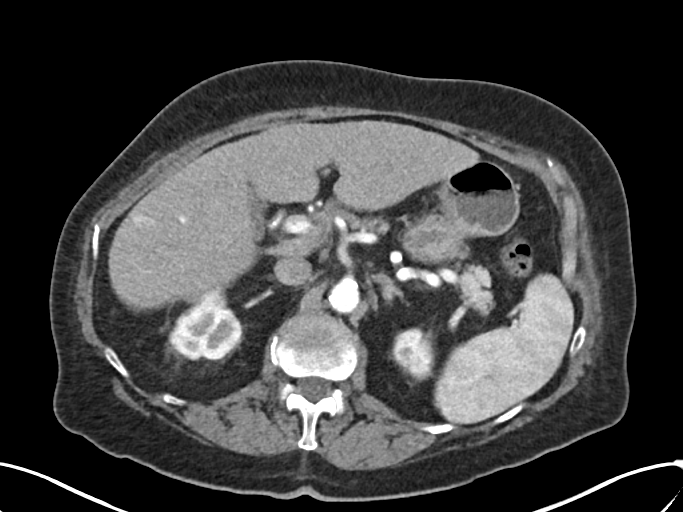
[im 83/98  soft-tissue]
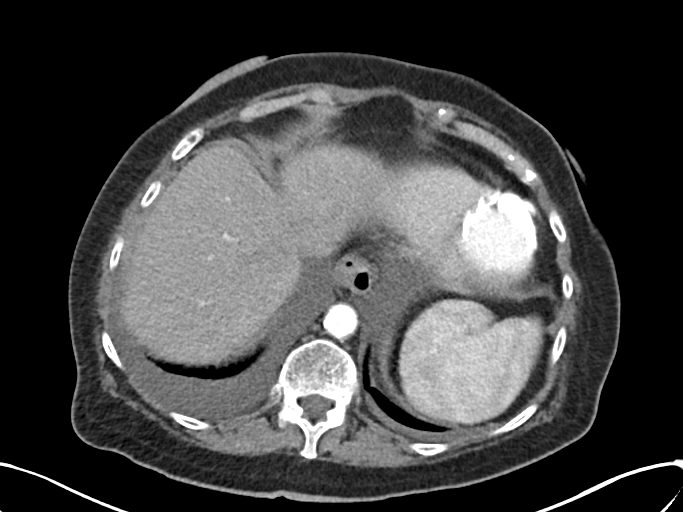
[im 83/98  bone]
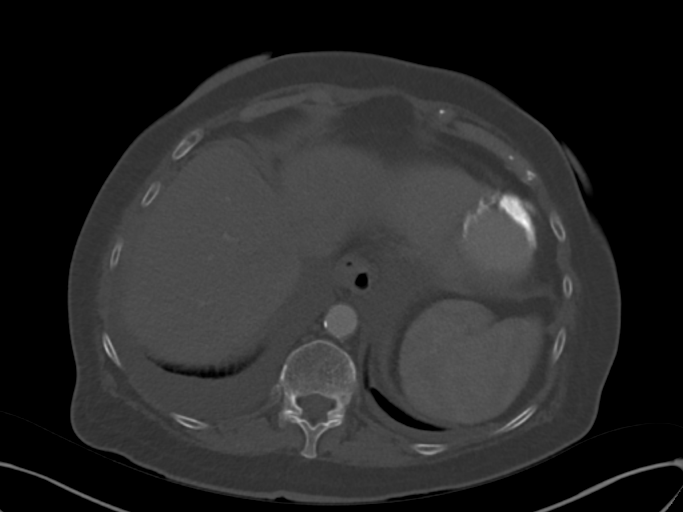
[im 93/98  soft-tissue]
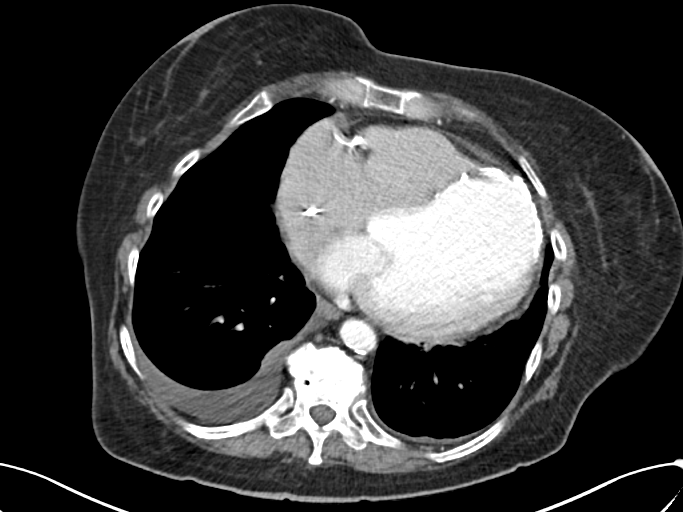

[Series 6: abd pelvis 2.00 br40 s3 cor · coronal · 0.77mm/px · 3 of 152 slices shown]
[im 51/152  soft-tissue]
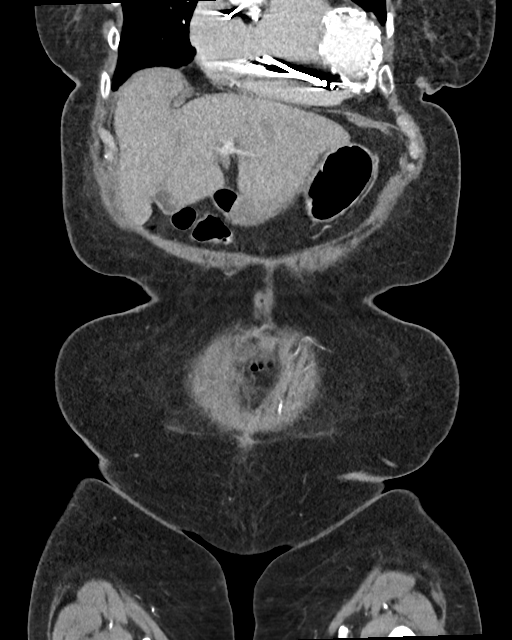
[im 68/152  soft-tissue]
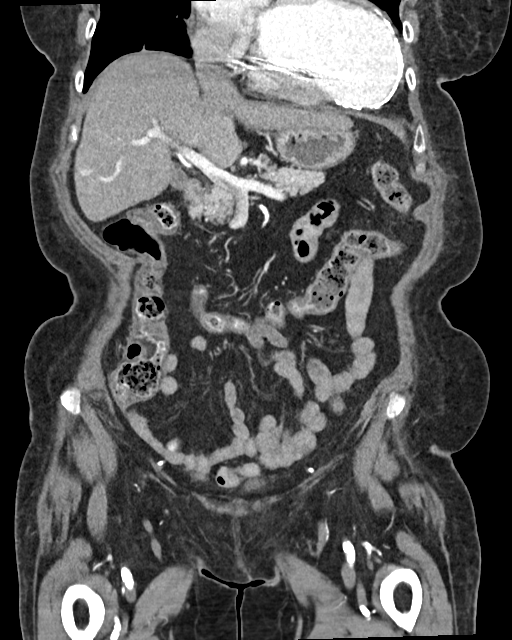
[im 84/152  soft-tissue]
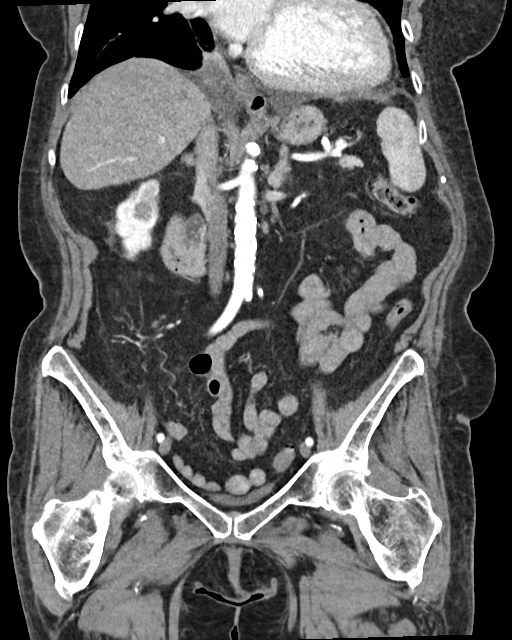

[13 of 46 positions shown; findings below may reference images not displayed]

FINDINGS: Lower chest: Small right pleural effusion and trace left pleural
effusion. Cardiomegaly. Pacer wires noted in the right heart. Aortic
atherosclerosis.

Hepatobiliary: No focal hepatic abnormality. Gallbladder
unremarkable.

Pancreas: No focal abnormality or ductal dilatation.

Spleen: No focal abnormality.  Normal size.

Adrenals/Urinary Tract: No adrenal abnormality. No focal renal
abnormality. No stones or hydronephrosis. Urinary bladder is
unremarkable.

Stomach/Bowel: Sigmoid diverticulosis. No active diverticulitis.
Stomach and small bowel decompressed.

Vascular/Lymphatic: Diffuse aortic atherosclerosis. No aneurysm or
adenopathy.

Reproductive: Prior hysterectomy.  No adnexal masses.

Other: No free fluid or free air.

Musculoskeletal: No acute bony abnormality.
IMPRESSION: Small right pleural effusion and trace left pleural effusion.

Cardiomegaly.

Aortic atherosclerosis.

Sigmoid diverticulosis.  No active diverticulitis.
